# Patient Record
Sex: Female | Born: 1945
Health system: Southern US, Community
[De-identification: ages and names within clinical notes are randomized; demographics above are authoritative.]

## PROBLEM LIST (undated history)

## (undated) DIAGNOSIS — M4310 Spondylolisthesis, site unspecified: Secondary | ICD-10-CM

## (undated) DIAGNOSIS — F329 Major depressive disorder, single episode, unspecified: Secondary | ICD-10-CM

## (undated) DIAGNOSIS — K219 Gastro-esophageal reflux disease without esophagitis: Secondary | ICD-10-CM

## (undated) DIAGNOSIS — Z8639 Personal history of other endocrine, nutritional and metabolic disease: Secondary | ICD-10-CM

## (undated) DIAGNOSIS — F32A Depression, unspecified: Secondary | ICD-10-CM

## (undated) DIAGNOSIS — G894 Chronic pain syndrome: Secondary | ICD-10-CM

## (undated) DIAGNOSIS — N189 Chronic kidney disease, unspecified: Secondary | ICD-10-CM

## (undated) DIAGNOSIS — J302 Other seasonal allergic rhinitis: Secondary | ICD-10-CM

## (undated) DIAGNOSIS — M1990 Unspecified osteoarthritis, unspecified site: Secondary | ICD-10-CM

## (undated) DIAGNOSIS — I10 Essential (primary) hypertension: Secondary | ICD-10-CM

## (undated) DIAGNOSIS — G43909 Migraine, unspecified, not intractable, without status migrainosus: Secondary | ICD-10-CM

## (undated) DIAGNOSIS — E1121 Type 2 diabetes mellitus with diabetic nephropathy: Secondary | ICD-10-CM

## (undated) DIAGNOSIS — M5136 Other intervertebral disc degeneration, lumbar region: Secondary | ICD-10-CM

## (undated) DIAGNOSIS — E119 Type 2 diabetes mellitus without complications: Secondary | ICD-10-CM

## (undated) DIAGNOSIS — M4180 Other forms of scoliosis, site unspecified: Secondary | ICD-10-CM

## (undated) DIAGNOSIS — M5126 Other intervertebral disc displacement, lumbar region: Secondary | ICD-10-CM

## (undated) DIAGNOSIS — M810 Age-related osteoporosis without current pathological fracture: Secondary | ICD-10-CM

## (undated) HISTORY — DX: Chronic kidney disease, unspecified: N18.9

## (undated) HISTORY — DX: Depression, unspecified: F32.A

## (undated) HISTORY — DX: Personal history of other endocrine, nutritional and metabolic disease: Z86.39

## (undated) HISTORY — DX: Other seasonal allergic rhinitis: J30.2

## (undated) HISTORY — DX: Major depressive disorder, single episode, unspecified: F32.9

## (undated) HISTORY — DX: Unspecified osteoarthritis, unspecified site: M19.90

## (undated) HISTORY — DX: Gastro-esophageal reflux disease without esophagitis: K21.9

## (undated) HISTORY — PX: OTHER SURGICAL HISTORY: SHX169

## (undated) HISTORY — DX: Type 2 diabetes mellitus without complications: E11.9

## (undated) HISTORY — DX: Migraine, unspecified, not intractable, without status migrainosus: G43.909

## (undated) HISTORY — DX: Other intervertebral disc displacement, lumbar region: M51.26

## (undated) HISTORY — DX: Other forms of scoliosis, site unspecified: M41.80

## (undated) HISTORY — DX: Type 2 diabetes mellitus with diabetic nephropathy: E11.21

## (undated) HISTORY — DX: Essential (primary) hypertension: I10

## (undated) HISTORY — DX: Other intervertebral disc degeneration, lumbar region: M51.36

## (undated) HISTORY — DX: Chronic pain syndrome: G89.4

## (undated) HISTORY — DX: Spondylolisthesis, site unspecified: M43.10

## (undated) HISTORY — DX: Age-related osteoporosis without current pathological fracture: M81.0

---
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
## 1991-08-14 HISTORY — PX: BUNIONECTOMY: SHX129

## 1991-08-14 HISTORY — PX: NECK SURGERY: SHX720

## 1997-08-13 HISTORY — PX: REPLACEMENT TOTAL KNEE: SUR1224

## 2001-08-13 HISTORY — PX: FOOT SURGERY: SHX648

## 2006-08-13 HISTORY — PX: FOOT SURGERY: SHX648

## 2014-06-05 LAB — COMPREHENSIVE METABOLIC PANEL
CALCIUM: 8.9 mg/dL
CO2: 23 mmol/L

## 2014-06-05 LAB — BASIC METABOLIC PANEL
Potassium: 3.8 mmol/L (ref 3.4–5.3)
Sodium: 138 mmol/L (ref 137–147)

## 2014-06-05 LAB — COMPLETE METABOLIC PANEL WITH GFR: GFR, EST NON AFRICAN AMERICAN: 48

## 2014-06-05 LAB — HEPATIC FUNCTION PANEL
ALT: 9 U/L (ref 7–35)
AST: 15 U/L (ref 13–35)

## 2014-06-05 LAB — HEMOGLOBIN A1C: HEMOGLOBIN A1C: 6.1

## 2014-06-05 LAB — VITAMIN D 25 HYDROXY (VIT D DEFICIENCY, FRACTURES)
25-Hydroxy, Vitamin D: 45.4
VITAMIN B12: 715

## 2014-06-05 LAB — CREATININE, URINE, RANDOM
Creatinine, Urine: 74
Microalb, Ur Waived: 4.4

## 2014-06-05 LAB — LIPID PANEL
Cholesterol: 170 mg/dL (ref 0–200)
HDL: 49 mg/dL (ref 35–70)
LDL Cholesterol: 90 mg/dL
Triglycerides: 153 mg/dL (ref 40–160)

## 2014-06-05 LAB — MICROALBUMIN / CREATININE URINE RATIO: MICROALB/CREAT RATIO: 5.9

## 2014-06-07 LAB — BASIC METABOLIC PANEL
BUN: 17 mg/dL (ref 4–21)
Creatinine: 1.2 mg/dL — AB (ref <=1.1)
Glucose: 102 mg/dL

## 2014-09-24 DIAGNOSIS — Z1231 Encounter for screening mammogram for malignant neoplasm of breast: Secondary | ICD-10-CM | POA: Diagnosis not present

## 2014-09-24 DIAGNOSIS — I10 Essential (primary) hypertension: Secondary | ICD-10-CM | POA: Diagnosis not present

## 2014-09-24 DIAGNOSIS — M5416 Radiculopathy, lumbar region: Secondary | ICD-10-CM | POA: Diagnosis not present

## 2014-09-24 DIAGNOSIS — E119 Type 2 diabetes mellitus without complications: Secondary | ICD-10-CM | POA: Diagnosis not present

## 2015-01-13 DIAGNOSIS — Z803 Family history of malignant neoplasm of breast: Secondary | ICD-10-CM | POA: Diagnosis not present

## 2015-01-13 DIAGNOSIS — Z1231 Encounter for screening mammogram for malignant neoplasm of breast: Secondary | ICD-10-CM | POA: Diagnosis not present

## 2015-01-17 DIAGNOSIS — M5416 Radiculopathy, lumbar region: Secondary | ICD-10-CM | POA: Diagnosis not present

## 2015-02-01 DIAGNOSIS — M810 Age-related osteoporosis without current pathological fracture: Secondary | ICD-10-CM | POA: Diagnosis not present

## 2015-02-01 DIAGNOSIS — E118 Type 2 diabetes mellitus with unspecified complications: Secondary | ICD-10-CM | POA: Diagnosis not present

## 2015-04-05 DIAGNOSIS — E119 Type 2 diabetes mellitus without complications: Secondary | ICD-10-CM | POA: Diagnosis not present

## 2015-04-05 DIAGNOSIS — H2513 Age-related nuclear cataract, bilateral: Secondary | ICD-10-CM | POA: Diagnosis not present

## 2015-05-16 DIAGNOSIS — Z23 Encounter for immunization: Secondary | ICD-10-CM | POA: Diagnosis not present

## 2015-05-16 DIAGNOSIS — E119 Type 2 diabetes mellitus without complications: Secondary | ICD-10-CM | POA: Diagnosis not present

## 2015-05-16 DIAGNOSIS — M810 Age-related osteoporosis without current pathological fracture: Secondary | ICD-10-CM | POA: Diagnosis not present

## 2015-05-16 DIAGNOSIS — G43009 Migraine without aura, not intractable, without status migrainosus: Secondary | ICD-10-CM | POA: Diagnosis not present

## 2015-05-16 DIAGNOSIS — I10 Essential (primary) hypertension: Secondary | ICD-10-CM | POA: Diagnosis not present

## 2015-06-02 ENCOUNTER — Ambulatory Visit (INDEPENDENT_AMBULATORY_CARE_PROVIDER_SITE_OTHER): Payer: Medicare Other | Admitting: Osteopathic Medicine

## 2015-06-02 ENCOUNTER — Encounter: Payer: Self-pay | Admitting: Osteopathic Medicine

## 2015-06-02 VITALS — BP 139/82 | HR 89 | Ht 62.0 in | Wt 205.0 lb

## 2015-06-02 DIAGNOSIS — G43809 Other migraine, not intractable, without status migrainosus: Secondary | ICD-10-CM | POA: Diagnosis not present

## 2015-06-02 DIAGNOSIS — E119 Type 2 diabetes mellitus without complications: Secondary | ICD-10-CM | POA: Insufficient documentation

## 2015-06-02 DIAGNOSIS — I10 Essential (primary) hypertension: Secondary | ICD-10-CM | POA: Insufficient documentation

## 2015-06-02 DIAGNOSIS — J302 Other seasonal allergic rhinitis: Secondary | ICD-10-CM

## 2015-06-02 DIAGNOSIS — Z8639 Personal history of other endocrine, nutritional and metabolic disease: Secondary | ICD-10-CM

## 2015-06-02 DIAGNOSIS — K219 Gastro-esophageal reflux disease without esophagitis: Secondary | ICD-10-CM

## 2015-06-02 DIAGNOSIS — M810 Age-related osteoporosis without current pathological fracture: Secondary | ICD-10-CM

## 2015-06-02 DIAGNOSIS — G43909 Migraine, unspecified, not intractable, without status migrainosus: Secondary | ICD-10-CM

## 2015-06-02 HISTORY — DX: Essential (primary) hypertension: I10

## 2015-06-02 HISTORY — DX: Personal history of other endocrine, nutritional and metabolic disease: Z86.39

## 2015-06-02 HISTORY — DX: Age-related osteoporosis without current pathological fracture: M81.0

## 2015-06-02 HISTORY — DX: Gastro-esophageal reflux disease without esophagitis: K21.9

## 2015-06-02 HISTORY — DX: Migraine, unspecified, not intractable, without status migrainosus: G43.909

## 2015-06-02 HISTORY — DX: Type 2 diabetes mellitus without complications: E11.9

## 2015-06-02 HISTORY — DX: Other seasonal allergic rhinitis: J30.2

## 2015-06-02 MED ORDER — GLYBURIDE 2.5 MG PO TABS: 2.5000 mg | ORAL_TABLET | Freq: Every day | ORAL | Status: DC

## 2015-06-02 NOTE — Progress Notes (Signed)
HPI: Teresa Medina (goes by Teresa Medina) is a 69 y.o. female who presents to [facility]  today for chief complaint of:  Chief Complaint  Patient presents with  . Establish Care    PCP/ manage diabetes   Moved from [place], needs to get refills.   DIABETES - has only been on Glyburide since 1992 diagnosis D> Lost weight over the years (intentional). Wants to stay on this, never been on Metformin. Checks Glc regularly in AM. Recent labs in August 2016 (2 months ago)  HTN - controlled, no CP/SOB  MIGRAINE - controlled with prn triptan, takes Topamax  GERD - controlled on PPI  PAIN/ARTHRITIS - Neck and back pain. 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    DIABETES SCREENING/PREVENTIVE CARE: A1C past 3-6 mos: Yes  controlled? Yes 6.2 05/16/15 BP goal <140/90: Yes  LDL goal <70: await records Eye exam annually: Yes , importance discussed with patient Foot exam: Yes  Microalbuminuria:Yes  Metformin: No ACE/ARB: Yes  Antiplatelet if ASCVD Risk >10%: No  Statin: No     Past medical, social and family history reviewed: Past Medical History  Diagnosis Date  . Hypertension   . Diabetes mellitus without complication (HCC)   . Migraine   . Arthritis   . Type 2 diabetes mellitus (HCC) 06/02/2015  . Essential hypertension 06/02/2015  . Migraine 06/02/2015  . Seasonal allergies 06/02/2015  . History of vitamin D deficiency 06/02/2015  . Osteoporosis 06/02/2015   No past surgical history on file. Social History  Substance Use Topics  . Smoking status: Not on file  . Smokeless tobacco: Not on file  . Alcohol Use: No   Family History  Problem Relation Age of Onset  . Diabetes Sister   . Heart Problems Sister     multiple bypass surgeries  . Cancer Sister   . Depression Brother   . Heart Problems  Brother     bad heart use oxygen    No current outpatient prescriptions on file.   No current facility-administered medications for this visit.   Allergies not on file    Review of Systems: CONSTITUTIONAL:  No  fever, no chills, No  unintentional weight changes HEAD/EYES/EARS/NOSE/THROAT: No headache, no vision change, no hearing change, No  sore throat CARDIAC: No chest pain, no pressure/palpitations, no orthopnea RESPIRATORY: No  cough, No  shortness of breath/wheeze GASTROINTESTINAL: No nausea, no vomiting, no abdominal pain, no blood in stool, no diarrhea, no constipation MUSCULOSKELETAL: (+) chronic myalgia/arthralgia GENITOURINARY: No incontinence, No abnormal genital bleeding/discharge SKIN: No rash/wounds/concerning lesions HEM/ONC: No easy bruising/bleeding, no abnormal lymph node ENDOCRINE: No polyuria/polydipsia/polyphagia, no heat/cold intolerance  NEUROLOGIC: No weakness, no dizziness, no slurred speech PSYCHIATRIC: No concerns with depression, no concerns with anxiety, no sleep problems    Exam:  BP 139/82 mmHg  Pulse 89  Ht  (1.575 m)  Wt 205 lb (92.987 kg)  BMI 37.49 kg/m2  SpO2 94% Constitutional: VSS, see above. General Appearance: alert, well-developed, well-nourished, NAD Eyes: Normal lids and conjunctive, non-icteric sclera,  Neck: No masses, trachea midline. No thyroid enlargement/tenderness/mass appreciated. No lymphadenopathy Respiratory: Normal respiratory effort. no wheeze, no rhonchi, no rales Cardiovascular: S1/S2 normal, no murmur, no rub/gallop auscultated. RRR.  No lower extremity edema. Gastrointestinal: Nontender, no masses.  Musculoskeletal: Gait normal. No clubbing/cyanosis of digits.  Neurological: No cranial nerve deficit on limited exam. Psychiatric: Normal judgment/insight. Normal mood and affect. Oriented x3.    No results found for this or any previous visit (from the past 72 hour(s)).    ASSESSMENT/PLAN:  Type 2 diabetes  mellitus without complication, without long-term current use of insulin (HCC) - Needs updated: review records for preventive. Consider ASA, statin. Pt declines Metformin.  - Plan: glyBURIDE (DIABETA) 2.5 MG tablet  Essential hypertension - contorlled  Other type of migraine - controlled  Seasonal allergies - ocntrolled  History of vitamin D deficiency  Osteoporosis - await records re: DEXA< may require med management  Gastroesophageal reflux disease, esophagitis presence not specified   Return in about 3 months (around 09/02/2015), or if symptoms worsen or fail to improve, for RECHECK A1C, REVIEW RECORDS AND CATCH UP ON PREVENTIVE CARE (30 MINUTE VISIT).

## 2015-06-20 ENCOUNTER — Other Ambulatory Visit: Payer: Self-pay | Admitting: Osteopathic Medicine

## 2015-06-20 NOTE — Telephone Encounter (Signed)
Can we confirm that she takes 50 mg po daily? Sig in history only says 50 by mouth. Thanks.

## 2015-06-21 ENCOUNTER — Telehealth: Payer: Self-pay | Admitting: Osteopathic Medicine

## 2015-06-21 MED ORDER — CYCLOBENZAPRINE HCL 10 MG PO TABS: 10.0000 mg | ORAL_TABLET | Freq: Two times a day (BID) | ORAL | Status: DC | PRN

## 2015-06-21 MED ORDER — FENTANYL 50 MCG/HR TD PT72: 50.0000 ug | MEDICATED_PATCH | TRANSDERMAL | Status: DC

## 2015-06-21 MED ORDER — RABEPRAZOLE SODIUM 20 MG PO TBEC: 20.0000 mg | DELAYED_RELEASE_TABLET | Freq: Every day | ORAL | Status: DC

## 2015-06-21 MED ORDER — TOPIRAMATE 50 MG PO TABS: 50.0000 mg | ORAL_TABLET | Freq: Two times a day (BID) | ORAL | Status: DC

## 2015-06-21 MED ORDER — MONTELUKAST SODIUM 10 MG PO TABS: 10.0000 mg | ORAL_TABLET | Freq: Every day | ORAL | Status: DC

## 2015-06-21 MED ORDER — METOPROLOL SUCCINATE ER 50 MG PO TB24: 50.0000 mg | ORAL_TABLET | Freq: Every day | ORAL | Status: DC

## 2015-06-21 NOTE — Telephone Encounter (Signed)
Spoke with Pt and she states she takes the Topamax BID. While on the phone, Pt realized she needs some refills on other medications. Verified dosage on all Rx's. Will route to PCP for review.

## 2015-06-21 NOTE — Telephone Encounter (Signed)
Spoke to patient gave her information as noted below. Patient understands that she need to get records from original provider. Rhonda Cunningham,CMA

## 2015-06-21 NOTE — Telephone Encounter (Signed)
Sent refills.  Fentanyl patches refilled, she needs to pick up prescription for this. I gave 30 days supply but will NOT give more without records from original prescriber - pt needs to provide these for us as I do not prescribe these meds routinely, alternatively we can refer her to pain management.

## 2015-06-22 ENCOUNTER — Other Ambulatory Visit: Payer: Self-pay | Admitting: Osteopathic Medicine

## 2015-06-22 MED ORDER — TOPIRAMATE 50 MG PO TABS: 50.0000 mg | ORAL_TABLET | Freq: Two times a day (BID) | ORAL | Status: DC

## 2015-06-23 ENCOUNTER — Encounter: Payer: Self-pay | Admitting: Osteopathic Medicine

## 2015-06-23 DIAGNOSIS — G894 Chronic pain syndrome: Secondary | ICD-10-CM

## 2015-06-23 DIAGNOSIS — M4310 Spondylolisthesis, site unspecified: Secondary | ICD-10-CM

## 2015-06-23 DIAGNOSIS — M4180 Other forms of scoliosis, site unspecified: Secondary | ICD-10-CM

## 2015-06-23 DIAGNOSIS — M51369 Other intervertebral disc degeneration, lumbar region without mention of lumbar back pain or lower extremity pain: Secondary | ICD-10-CM

## 2015-06-23 DIAGNOSIS — M5126 Other intervertebral disc displacement, lumbar region: Secondary | ICD-10-CM

## 2015-06-23 DIAGNOSIS — M5136 Other intervertebral disc degeneration, lumbar region: Secondary | ICD-10-CM

## 2015-06-23 HISTORY — DX: Chronic pain syndrome: G89.4

## 2015-06-23 HISTORY — DX: Other intervertebral disc displacement, lumbar region: M51.26

## 2015-06-23 HISTORY — DX: Other intervertebral disc degeneration, lumbar region: M51.36

## 2015-06-23 HISTORY — DX: Other intervertebral disc degeneration, lumbar region without mention of lumbar back pain or lower extremity pain: M51.369

## 2015-06-23 HISTORY — DX: Other forms of scoliosis, site unspecified: M41.80

## 2015-06-23 HISTORY — DX: Spondylolisthesis, site unspecified: M43.10

## 2015-07-06 ENCOUNTER — Other Ambulatory Visit: Payer: Self-pay | Admitting: Osteopathic Medicine

## 2015-07-15 ENCOUNTER — Other Ambulatory Visit: Payer: Self-pay

## 2015-07-15 ENCOUNTER — Telehealth: Payer: Self-pay

## 2015-07-15 MED ORDER — FENTANYL 50 MCG/HR TD PT72: 50.0000 ug | MEDICATED_PATCH | TRANSDERMAL | Status: DC

## 2015-07-15 NOTE — Telephone Encounter (Signed)
Per previous phone note from Dr. Lyn HollingsheadAlexander we will not be providing any more fentanyl until records are obtained from her previous providers. I will route this to Dr. Lyn HollingsheadAlexander as well.

## 2015-07-15 NOTE — Telephone Encounter (Signed)
Patient states she only received a half of months prescription for her Fentanyl patch. She would like another prescription for the rest of the month and Dr Lyn HollingsheadAlexander has left for the day.

## 2015-07-18 ENCOUNTER — Other Ambulatory Visit: Payer: Self-pay | Admitting: Osteopathic Medicine

## 2015-07-18 MED ORDER — FENTANYL 50 MCG/HR TD PT72: 50.0000 ug | MEDICATED_PATCH | TRANSDERMAL | Status: DC

## 2015-07-18 NOTE — Telephone Encounter (Signed)
Patient aware.

## 2015-07-18 NOTE — Telephone Encounter (Signed)
I did get her records, we can refill Fentanyl for 30 days supply

## 2015-08-18 ENCOUNTER — Other Ambulatory Visit: Payer: Self-pay | Admitting: Osteopathic Medicine

## 2015-08-18 ENCOUNTER — Telehealth: Payer: Self-pay

## 2015-08-18 DIAGNOSIS — G894 Chronic pain syndrome: Secondary | ICD-10-CM

## 2015-08-18 MED ORDER — FENTANYL 50 MCG/HR TD PT72: 50.0000 ug | MEDICATED_PATCH | TRANSDERMAL | Status: DC

## 2015-08-18 NOTE — Telephone Encounter (Signed)
Patient would like a refill on her fentanyl patches. Is this medication going to be a long term medication? Please advise.

## 2015-08-18 NOTE — Telephone Encounter (Signed)
Patient advised to pick up prescription.

## 2015-08-18 NOTE — Telephone Encounter (Signed)
This is a long-term medication, she came to me on these. In the future, we can tell patient for pharmacy to send us request for these

## 2015-08-26 ENCOUNTER — Telehealth: Payer: Self-pay | Admitting: Emergency Medicine

## 2015-08-26 DIAGNOSIS — I10 Essential (primary) hypertension: Secondary | ICD-10-CM

## 2015-08-26 DIAGNOSIS — G894 Chronic pain syndrome: Secondary | ICD-10-CM

## 2015-08-26 MED ORDER — LOSARTAN POTASSIUM 100 MG PO TABS: 100.0000 mg | ORAL_TABLET | Freq: Every day | ORAL | Status: DC

## 2015-08-31 ENCOUNTER — Encounter: Payer: Self-pay | Admitting: Osteopathic Medicine

## 2015-08-31 ENCOUNTER — Ambulatory Visit (INDEPENDENT_AMBULATORY_CARE_PROVIDER_SITE_OTHER): Payer: Medicare Other | Admitting: Osteopathic Medicine

## 2015-08-31 VITALS — BP 133/68 | HR 85 | Ht 62.0 in | Wt 207.0 lb

## 2015-08-31 DIAGNOSIS — M810 Age-related osteoporosis without current pathological fracture: Secondary | ICD-10-CM

## 2015-08-31 DIAGNOSIS — E119 Type 2 diabetes mellitus without complications: Secondary | ICD-10-CM

## 2015-08-31 DIAGNOSIS — Z8639 Personal history of other endocrine, nutritional and metabolic disease: Secondary | ICD-10-CM | POA: Diagnosis not present

## 2015-08-31 DIAGNOSIS — Z780 Asymptomatic menopausal state: Secondary | ICD-10-CM

## 2015-08-31 DIAGNOSIS — J302 Other seasonal allergic rhinitis: Secondary | ICD-10-CM

## 2015-08-31 DIAGNOSIS — I10 Essential (primary) hypertension: Secondary | ICD-10-CM | POA: Diagnosis not present

## 2015-08-31 DIAGNOSIS — G43809 Other migraine, not intractable, without status migrainosus: Secondary | ICD-10-CM | POA: Diagnosis not present

## 2015-08-31 DIAGNOSIS — G894 Chronic pain syndrome: Secondary | ICD-10-CM

## 2015-08-31 DIAGNOSIS — K219 Gastro-esophageal reflux disease without esophagitis: Secondary | ICD-10-CM

## 2015-08-31 DIAGNOSIS — Z79899 Other long term (current) drug therapy: Secondary | ICD-10-CM

## 2015-08-31 DIAGNOSIS — M949 Disorder of cartilage, unspecified: Secondary | ICD-10-CM

## 2015-08-31 DIAGNOSIS — M899 Disorder of bone, unspecified: Secondary | ICD-10-CM

## 2015-08-31 DIAGNOSIS — Z23 Encounter for immunization: Secondary | ICD-10-CM

## 2015-08-31 HISTORY — DX: Asymptomatic menopausal state: Z78.0

## 2015-08-31 LAB — POCT GLYCOSYLATED HEMOGLOBIN (HGB A1C): Hemoglobin A1C: 7.1

## 2015-08-31 MED ORDER — DULOXETINE HCL 30 MG PO CPEP: 90.0000 mg | ORAL_CAPSULE | Freq: Every day | ORAL | Status: DC

## 2015-08-31 MED ORDER — SUMATRIPTAN SUCCINATE 100 MG PO TABS: ORAL_TABLET | ORAL | Status: DC

## 2015-08-31 MED ORDER — AMBULATORY NON FORMULARY MEDICATION
Status: DC
Start: 2015-08-31 — End: 2017-10-10

## 2015-08-31 NOTE — Progress Notes (Signed)
HPI: Teresa Medina (goes by Teresa Medina) is a 70 y.o. female who presents to [facility]  today for chief complaint of:  Chief Complaint  Patient presents with  . Follow-up    DIABETES    DIABETES - has only been on Glyburide since 1992 diagnosis. Lost weight over the years (intentional). Wants to stay on this, never been on Metformin. Checks Glc regularly in AM. DIABETES SCREENING/PREVENTIVE CARE: A1C past 3-6 mos: Yes  controlled? Yes 6.2 05/16/15,  BP goal <140/90: Yes  LDL goal <70: await labs Eye exam annually: Yes , importance discussed with patient Foot exam:  today Microalbuminuria:   Metformin: No - declines ACE/ARB: Yes  Antiplatelet if ASCVD Risk >10%: await labs Statin: No   HTN - controlled, no CP/SOB  MIGRAINE - controlled with prn triptan, takes Topamax, needs refills  GERD - controlled on PPI  PAIN/ARTHRITIS - Neck and back pain. 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     Past medical, social and family history reviewed: Past Medical History  Diagnosis Date  . Hypertension   . Diabetes mellitus without complication (HCC)   . Migraine   . Arthritis   . Type 2 diabetes mellitus (HCC) 06/02/2015  . Essential hypertension 06/02/2015  . Migraine 06/02/2015  . Seasonal allergies 06/02/2015  . History of vitamin D deficiency 06/02/2015  . Osteoporosis 06/02/2015  . GERD (gastroesophageal reflux disease) 06/02/2015  . Chronic pain syndrome 06/23/2015    Records reviewed: pt has been on Fentanyl TD, Cymbalta, Flexeril  . Levoscoliosis 06/23/2015    Mild, MRI 06/22/14  . Retrolisthesis of vertebrae 06/23/2015    Gr1 L1 on L2 MRI 06/2014  . Degenerative disc disease, lumbar 06/23/2015    MRI 06/2014 most severe L4-5  . Herniation of intervertebral disc between L4 and L5 06/23/2015    MRI 06/2014 moderate to severe R foraminal narrowing   No past surgical history on file. Social History  Substance Use Topics  . Smoking status: Never Smoker   . Smokeless tobacco: Not on file  . Alcohol Use: No   Family History  Problem Relation Age of Onset  . Diabetes Sister   . Heart Problems Sister     multiple bypass surgeries  . Cancer Sister   . Depression Brother   . Heart Problems Brother     bad heart use oxygen    Current Outpatient Prescriptions  Medication Sig Dispense Refill  . Cholecalciferol (VITAMIN D-3) 1000 UNITS CAPS Take by mouth.    . cyclobenzaprine (FLEXERIL) 10 MG tablet Take 1 tablet (10 mg total) by mouth 2 (two) times daily as needed for muscle spasms. 30 tablet 3  . DULoxetine (CYMBALTA) 30 MG capsule Take 30 mg by mouth daily.    . fentaNYL (DURAGESIC - DOSED MCG/HR) 50 MCG/HR Place 1 patch (50 mcg total) onto the skin every 3 (three) days. 10 patch 0  . fluticasone (FLONASE) 50 MCG/ACT nasal spray Place into both nostrils.    Marland Kitchen glucose blood test strip by Other route. Free Style Test Strips    . glyBURIDE (DIABETA) 2.5 MG tablet Take 1 tablet (2.5 mg total) by mouth daily with breakfast. 90 tablet 3  . losartan (COZAAR) 100 MG tablet Take 1 tablet (100 mg total) by mouth daily. 90 tablet 0  . metoprolol  succinate (TOPROL XL) 50 MG 24 hr tablet Take 1 tablet (50 mg total) by mouth daily. Take with or immediately following a meal. 90 tablet 1  . montelukast (SINGULAIR) 10 MG tablet Take 1 tablet (10 mg total) by mouth at bedtime. 90 tablet 1  . RABEprazole (ACIPHEX) 20 MG tablet Take 1 tablet (20 mg total) by mouth daily. 90 tablet 1  . SUMAtriptan (IMITREX) 100 MG tablet Take 100 mg by mouth. May repeat in 2 hours if headache persists or recurs.    . topiramate (TOPAMAX) 50 MG tablet Take 1 tablet (50 mg total) by mouth 2 (two) times daily. 180 tablet 2   No current facility-administered medications for this visit.   No Known Allergies    Review  of Systems: CONSTITUTIONAL:  No  fever, no chills, No  unintentional weight changes HEAD/EYES/EARS/NOSE/THROAT: No headache, no vision change, no hearing change, No  sore throat CARDIAC: No chest pain, no pressure/palpitations, no orthopnea RESPIRATORY: No  cough, No  shortness of breath/wheeze GASTROINTESTINAL: No nausea, no vomiting, no abdominal pain MUSCULOSKELETAL: (+) chronic myalgia/arthralgia ENDOCRINE: No polyuria/polydipsia/polyphagia, no heat/cold intolerance     Exam:  BP 133/68 mmHg  Pulse 85  Ht  (1.575 m)  Wt 207 lb (93.895 kg)  BMI 37.85 kg/m2 Constitutional: VSS, see above. General Appearance: alert, well-developed, well-nourished, NAD Eyes: Normal lids and conjunctive, non-icteric sclera,  Neck: No masses, trachea midline. No thyroid enlargement/tenderness/mass appreciated. No lymphadenopathy Respiratory: Normal respiratory effort. no wheeze, no rhonchi, no rales Cardiovascular: S1/S2 normal, no murmur, no rub/gallop auscultated. RRR. No lower extremity edema. Gastrointestinal: Nontender, no masses.  Musculoskeletal: Gait normal. No clubbing/cyanosis of digits.  Neurological: No cranial nerve deficit on limited exam. Psychiatric: Normal judgment/insight. Normal mood and affect. Oriented x3.    ASSESSMENT/PLAN:  Type 2 diabetes mellitus without complication, without long-term current use of insulin (HCC) - Plan: CBC with Differential/Platelet, COMPLETE METABOLIC PANEL WITH GFR, Lipid panel, TSH  Essential hypertension  Other type of migraine  History of vitamin D deficiency - Plan: SUMAtriptan (IMITREX) 100 MG tablet  Gastroesophageal reflux disease, esophagitis presence not specified  Osteoporosis  Seasonal allergies  Medication management - Plan: CBC with Differential/Platelet, COMPLETE METABOLIC PANEL WITH GFR, Lipid panel  Postmenopausal - Plan: VITAMIN D 25 Hydroxy (Vit-D Deficiency, Fractures)  Disorder of bone and cartilage - Plan: VITAMIN  D 25 Hydroxy (Vit-D Deficiency, Fractures)  Chronic pain syndrome - Plan: DULoxetine (CYMBALTA) 30 MG capsule  Need for zoster vaccination - Plan: AMBULATORY NON FORMULARY MEDICATION   Return in about 3 months (around 11/29/2015) for ANNUAL PREVENTIVE CARE PHYSICAL.

## 2015-09-14 ENCOUNTER — Other Ambulatory Visit: Payer: Self-pay | Admitting: Osteopathic Medicine

## 2015-09-14 MED ORDER — FENTANYL 50 MCG/HR TD PT72: 50.0000 ug | MEDICATED_PATCH | TRANSDERMAL | Status: DC

## 2015-09-14 NOTE — Telephone Encounter (Signed)
Ok I left Rx up front can call her to pick up thanks

## 2015-10-10 ENCOUNTER — Telehealth: Payer: Self-pay

## 2015-10-10 MED ORDER — RABEPRAZOLE SODIUM 20 MG PO TBEC: 20.0000 mg | DELAYED_RELEASE_TABLET | Freq: Every day | ORAL | Status: DC

## 2015-10-10 NOTE — Telephone Encounter (Signed)
Patient request refill for Aciphex. 90 day supply. Rhonda Cunningham,CMA

## 2015-10-11 ENCOUNTER — Telehealth: Payer: Self-pay | Admitting: Osteopathic Medicine

## 2015-10-11 NOTE — Telephone Encounter (Signed)
Received fax for prior authorization sent through cover my meds and received an authorization. - CF

## 2015-10-18 ENCOUNTER — Other Ambulatory Visit: Payer: Self-pay | Admitting: Osteopathic Medicine

## 2015-10-20 ENCOUNTER — Telehealth: Payer: Self-pay

## 2015-10-20 DIAGNOSIS — G894 Chronic pain syndrome: Secondary | ICD-10-CM

## 2015-10-20 MED ORDER — FENTANYL 50 MCG/HR TD PT72: 50.0000 ug | MEDICATED_PATCH | TRANSDERMAL | Status: DC

## 2015-10-20 NOTE — Telephone Encounter (Signed)
Patient daughter called [facility][phone] refills was approved and signed for daughter to pick up today. Rhonda Cunningham,CMA

## 2015-11-24 ENCOUNTER — Telehealth: Payer: Self-pay | Admitting: *Deleted

## 2015-11-24 DIAGNOSIS — Z79899 Other long term (current) drug therapy: Secondary | ICD-10-CM | POA: Diagnosis not present

## 2015-11-24 DIAGNOSIS — M949 Disorder of cartilage, unspecified: Secondary | ICD-10-CM | POA: Diagnosis not present

## 2015-11-24 DIAGNOSIS — M899 Disorder of bone, unspecified: Secondary | ICD-10-CM | POA: Diagnosis not present

## 2015-11-24 DIAGNOSIS — E119 Type 2 diabetes mellitus without complications: Secondary | ICD-10-CM | POA: Diagnosis not present

## 2015-11-24 DIAGNOSIS — G894 Chronic pain syndrome: Secondary | ICD-10-CM

## 2015-11-24 DIAGNOSIS — Z780 Asymptomatic menopausal state: Secondary | ICD-10-CM | POA: Diagnosis not present

## 2015-11-24 MED ORDER — FENTANYL 50 MCG/HR TD PT72: 50.0000 ug | MEDICATED_PATCH | TRANSDERMAL | Status: DC

## 2015-11-24 NOTE — Telephone Encounter (Addendum)
Daughter called at 1:24 stating her mother was coming up here for labs and wanted to know if we could have her pain patches read. Printed rx and put in providers box

## 2015-11-25 LAB — COMPLETE METABOLIC PANEL WITH GFR
ALT: 8 U/L (ref 6–29)
AST: 13 U/L (ref 10–35)
Albumin: 4.1 g/dL (ref 3.6–5.1)
Alkaline Phosphatase: 101 U/L (ref 33–130)
BUN: 16 mg/dL (ref 7–25)
CHLORIDE: 103 mmol/L (ref 98–110)
CO2: 24 mmol/L (ref 20–31)
Calcium: 9.4 mg/dL (ref 8.6–10.4)
Creat: 1.11 mg/dL — ABNORMAL HIGH (ref 0.60–0.93)
GFR, EST NON AFRICAN AMERICAN: 50 mL/min — AB (ref >=60)
GFR, Est African American: 58 mL/min — ABNORMAL LOW (ref >=60)
GLUCOSE: 132 mg/dL — AB (ref 65–99)
POTASSIUM: 4.1 mmol/L (ref 3.5–5.3)
SODIUM: 137 mmol/L (ref 135–146)
Total Bilirubin: 0.7 mg/dL (ref 0.2–1.2)
Total Protein: 6.5 g/dL (ref 6.1–8.1)

## 2015-11-25 LAB — TSH: TSH: 0.7 m[IU]/L

## 2015-11-25 LAB — LIPID PANEL
CHOL/HDL RATIO: 3.5 ratio (ref <=5.0)
Cholesterol: 178 mg/dL (ref 125–200)
HDL: 51 mg/dL (ref >=46)
LDL CALC: 100 mg/dL (ref <130)
Triglycerides: 137 mg/dL (ref <150)
VLDL: 27 mg/dL (ref <30)

## 2015-11-25 LAB — CBC WITH DIFFERENTIAL/PLATELET
BASOS ABS: 0 {cells}/uL (ref 0–200)
Basophils Relative: 0 %
EOS PCT: 2 %
Eosinophils Absolute: 202 cells/uL (ref 15–500)
HCT: 48.4 % — ABNORMAL HIGH (ref 35.0–45.0)
Hemoglobin: 15.3 g/dL (ref 11.7–15.5)
Lymphocytes Relative: 24 %
Lymphs Abs: 2424 cells/uL (ref 850–3900)
MCH: 27.5 pg (ref 27.0–33.0)
MCHC: 31.6 g/dL — AB (ref 32.0–36.0)
MCV: 86.9 fL (ref 80.0–100.0)
MONOS PCT: 6 %
MPV: 10.6 fL (ref 7.5–12.5)
Monocytes Absolute: 606 cells/uL (ref 200–950)
NEUTROS PCT: 68 %
Neutro Abs: 6868 cells/uL (ref 1500–7800)
PLATELETS: 195 10*3/uL (ref 140–400)
RBC: 5.57 MIL/uL — ABNORMAL HIGH (ref 3.80–5.10)
RDW: 13.8 % (ref 11.0–15.0)
WBC: 10.1 10*3/uL (ref 3.8–10.8)

## 2015-11-25 LAB — VITAMIN D 25 HYDROXY (VIT D DEFICIENCY, FRACTURES): Vit D, 25-Hydroxy: 35 ng/mL (ref 30–100)

## 2015-11-29 ENCOUNTER — Ambulatory Visit (INDEPENDENT_AMBULATORY_CARE_PROVIDER_SITE_OTHER): Payer: Medicare Other | Admitting: Osteopathic Medicine

## 2015-11-29 ENCOUNTER — Encounter: Payer: Self-pay | Admitting: Osteopathic Medicine

## 2015-11-29 VITALS — BP 136/84 | HR 60 | Ht 62.0 in | Wt 205.0 lb

## 2015-11-29 DIAGNOSIS — Z0000 Encounter for general adult medical examination without abnormal findings: Secondary | ICD-10-CM

## 2015-11-29 DIAGNOSIS — Z9181 History of falling: Secondary | ICD-10-CM

## 2015-11-29 DIAGNOSIS — E1121 Type 2 diabetes mellitus with diabetic nephropathy: Secondary | ICD-10-CM | POA: Insufficient documentation

## 2015-11-29 DIAGNOSIS — Z23 Encounter for immunization: Secondary | ICD-10-CM

## 2015-11-29 DIAGNOSIS — Z780 Asymptomatic menopausal state: Secondary | ICD-10-CM

## 2015-11-29 DIAGNOSIS — I10 Essential (primary) hypertension: Secondary | ICD-10-CM

## 2015-11-29 DIAGNOSIS — N183 Chronic kidney disease, stage 3 (moderate): Secondary | ICD-10-CM

## 2015-11-29 DIAGNOSIS — N189 Chronic kidney disease, unspecified: Secondary | ICD-10-CM

## 2015-11-29 DIAGNOSIS — Z9289 Personal history of other medical treatment: Secondary | ICD-10-CM | POA: Insufficient documentation

## 2015-11-29 HISTORY — DX: Chronic kidney disease, unspecified: N18.9

## 2015-11-29 HISTORY — DX: Type 2 diabetes mellitus with diabetic nephropathy: E11.21

## 2015-11-29 LAB — POCT GLYCOSYLATED HEMOGLOBIN (HGB A1C): HEMOGLOBIN A1C: 6.6

## 2015-11-29 MED ORDER — CALCIUM CARBONATE-VITAMIN D 600-400 MG-UNIT PO CHEW: 2.0000 | CHEWABLE_TABLET | Freq: Every day | ORAL | Status: DC

## 2015-11-29 MED ORDER — CYCLOBENZAPRINE HCL 10 MG PO TABS: 10.0000 mg | ORAL_TABLET | Freq: Three times a day (TID) | ORAL | Status: DC

## 2015-11-29 MED ORDER — LOSARTAN POTASSIUM 100 MG PO TABS: 100.0000 mg | ORAL_TABLET | Freq: Every day | ORAL | Status: DC

## 2015-11-29 NOTE — Patient Instructions (Addendum)
 Fall Prevention in the Home  Falls can cause injuries. They can happen to people of all ages. There are many things you can do to make your home safe and to help prevent falls.  WHAT CAN I DO ON THE OUTSIDE OF MY HOME?  Regularly fix the edges of walkways and driveways and fix any cracks.  Remove anything that might make you trip as you walk through a door, such as a raised step or threshold.  Trim any bushes or trees on the path to your home.  Use bright outdoor lighting.  Clear any walking paths of anything that might make someone trip, such as rocks or tools.  Regularly check to see if handrails are loose or broken. Make sure that both sides of any steps have handrails.  Any raised decks and porches should have guardrails on the edges.  Have any leaves, snow, or ice cleared regularly.  Use sand or salt on walking paths during winter.  Clean up any spills in your garage right away. This includes oil or grease spills. WHAT CAN I DO IN THE BATHROOM?   Use night lights.  Install grab bars by the toilet and in the tub and shower. Do not use towel bars as grab bars.  Use non-skid mats or decals in the tub or shower.  If you need to sit down in the shower, use a plastic, non-slip stool.  Keep the floor dry. Clean up any water that spills on the floor as soon as it happens.  Remove soap buildup in the tub or shower regularly.  Attach bath mats securely with double-sided non-slip rug tape.  Do not have throw rugs and other things on the floor that can make you trip. WHAT CAN I DO IN THE BEDROOM?  Use night lights.  Make sure that you have a light by your bed that is easy to reach.  Do not use any sheets or blankets that are too big for your bed. They should not hang down onto the floor.  Have a firm chair that has side arms. You can use this for support while you get dressed.  Do not have throw rugs and other things on the floor that can make you trip. WHAT CAN I DO  IN THE KITCHEN?  Clean up any spills right away.  Avoid walking on wet floors.  Keep items that you use a lot in easy-to-reach places.  If you need to reach something above you, use a strong step stool that has a grab bar.  Keep electrical cords out of the way.  Do not use floor polish or wax that makes floors slippery. If you must use wax, use non-skid floor wax.  Do not have throw rugs and other things on the floor that can make you trip. WHAT CAN I DO WITH MY STAIRS?  Do not leave any items on the stairs.  Make sure that there are handrails on both sides of the stairs and use them. Fix handrails that are broken or loose. Make sure that handrails are as long as the stairways.  Check any carpeting to make sure that it is firmly attached to the stairs. Fix any carpet that is loose or worn.  Avoid having throw rugs at the top or bottom of the stairs. If you do have throw rugs, attach them to the floor with carpet tape.  Make sure that you have a light switch at the top of the stairs and the bottom of the   stairs. If you do not have them, ask someone to add them for you. WHAT ELSE CAN I DO TO HELP PREVENT FALLS?  Wear shoes that:  Do not have high heels.  Have rubber bottoms.  Are comfortable and fit you well.  Are closed at the toe. Do not wear sandals.  If you use a stepladder:  Make sure that it is fully opened. Do not climb a closed stepladder.  Make sure that both sides of the stepladder are locked into place.  Ask someone to hold it for you, if possible.  Clearly mark and make sure that you can see:  Any grab bars or handrails.  First and last steps.  Where the edge of each step is.  Use tools that help you move around (mobility aids) if they are needed. These include:  Canes.  Walkers.  Scooters.  Crutches.  Turn on the lights when you go into a dark area. Replace any light bulbs as soon as they burn out.  Set up your furniture so you have a clear  path. Avoid moving your furniture around.  If any of your floors are uneven, fix them.  If there are any pets around you, be aware of where they are.  Review your medicines with your doctor. Some medicines can make you feel dizzy. This can increase your chance of falling. Ask your doctor what other things that you can do to help prevent falls.   This information is not intended to replace advice given to you by your health care provider. Make sure you discuss any questions you have with your health care provider.   Document Released: 05/26/2009 Document Revised: 12/14/2014 Document Reviewed: 09/03/2014 Elsevier Interactive Patient Education 2016 [employer]  Health Maintenance, Female Adopting a healthy lifestyle and getting preventive care can go a long way to promote health and wellness. Talk with your health care provider about what schedule of regular examinations is right for you. This is a good chance for you to check in with your provider about disease prevention and staying healthy. In between checkups, there are plenty of things you can do on your own. Experts have done a lot of research about which lifestyle changes and preventive measures are most likely to keep you healthy. Ask your health care provider for more information. WEIGHT AND DIET  Eat a healthy diet  Be sure to include plenty of vegetables, fruits, low-fat dairy products, and lean protein.  Do not eat a lot of foods high in solid fats, added sugars, or salt.  Get regular exercise. This is one of the most important things you can do for your health.  Most adults should exercise for at least 150 minutes each week. The exercise should increase your heart rate and make you sweat (moderate-intensity exercise).  Most adults should also do strengthening exercises at least twice a week. This is in addition to the moderate-intensity exercise.  Maintain a healthy weight  Body mass index (BMI) is a measurement that can  be used to identify possible weight problems. It estimates body fat based on height and weight. Your health care provider can help determine your BMI and help you achieve or maintain a healthy weight.  For females 20 years of age and older:   A BMI below 18.5 is considered underweight.  A BMI of 18.5 to 24.9 is normal.  A BMI of 25 to 29.9 is considered overweight.  A BMI of 30 and above is considered obese.  Watch levels   of cholesterol and blood lipids  You should start having your blood tested for lipids and cholesterol at 71 years of age, then have this test every 5 years.  You may need to have your cholesterol levels checked more often if:  Your lipid or cholesterol levels are high.  You are older than 71 years of age.  You are at high risk for heart disease.  CANCER SCREENING   Lung Cancer  Lung cancer screening is recommended for adults 53-62 years old who are at high risk for lung cancer because of a history of smoking.  A yearly low-dose CT scan of the lungs is recommended for people who:  Currently smoke.  Have quit within the past 15 years.  Have at least a 30-pack-year history of smoking. A pack year is smoking an average of one pack of cigarettes a day for 1 year.  Yearly screening should continue until it has been 15 years since you quit.  Yearly screening should stop if you develop a health problem that would prevent you from having lung cancer treatment.  Breast Cancer  Practice breast self-awareness. This means understanding how your breasts normally appear and feel.  It also means doing regular breast self-exams. Let your health care provider know about any changes, no matter how small.  If you are in your 20s or 30s, you should have a clinical breast exam (CBE) by a health care provider every 1-3 years as part of a regular health exam.  If you are 4 or older, have a CBE every year. Also consider having a breast X-ray (mammogram) every year.  If  you have a family history of breast cancer, talk to your health care provider about genetic screening.  If you are at high risk for breast cancer, talk to your health care provider about having an MRI and a mammogram every year.  Breast cancer gene (BRCA) assessment is recommended for women who have family members with BRCA-related cancers. BRCA-related cancers include:  Breast.  Ovarian.  Tubal.  Peritoneal cancers.  Results of the assessment will determine the need for genetic counseling and BRCA1 and BRCA2 testing. Cervical Cancer Your health care provider may recommend that you be screened regularly for cancer of the pelvic organs (ovaries, uterus, and vagina). This screening involves a pelvic examination, including checking for microscopic changes to the surface of your cervix (Pap test). You may be encouraged to have this screening done every 3 years, beginning at age 75.  For women ages 5-65, health care providers may recommend pelvic exams and Pap testing every 3 years, or they may recommend the Pap and pelvic exam, combined with testing for human papilloma virus (HPV), every 5 years. Some types of HPV increase your risk of cervical cancer. Testing for HPV may also be done on women of any age with unclear Pap test results.  Other health care providers may not recommend any screening for nonpregnant women who are considered low risk for pelvic cancer and who do not have symptoms. Ask your health care provider if a screening pelvic exam is right for you.  If you have had past treatment for cervical cancer or a condition that could lead to cancer, you need Pap tests and screening for cancer for at least 20 years after your treatment. If Pap tests have been discontinued, your risk factors (such as having a new sexual partner) need to be reassessed to determine if screening should resume. Some women have medical problems that increase the chance  of getting cervical cancer. In these cases,  your health care provider may recommend more frequent screening and Pap tests. Colorectal Cancer  This type of cancer can be detected and often prevented.  Routine colorectal cancer screening usually begins at 71 years of age and continues through 71 years of age.  Your health care provider may recommend screening at an earlier age if you have risk factors for colon cancer.  Your health care provider may also recommend using home test kits to check for hidden blood in the stool.  A small camera at the end of a tube can be used to examine your colon directly (sigmoidoscopy or colonoscopy). This is done to check for the earliest forms of colorectal cancer.  Routine screening usually begins at age 50.  Direct examination of the colon should be repeated every 5-10 years through 71 years of age. However, you may need to be screened more often if early forms of precancerous polyps or small growths are found. Skin Cancer  Check your skin from head to toe regularly.  Tell your health care provider about any new moles or changes in moles, especially if there is a change in a mole's shape or color.  Also tell your health care provider if you have a mole that is larger than the size of a pencil eraser.  Always use sunscreen. Apply sunscreen liberally and repeatedly throughout the day.  Protect yourself by wearing long sleeves, pants, a wide-brimmed hat, and sunglasses whenever you are outside. HEART DISEASE, DIABETES, AND HIGH BLOOD PRESSURE   High blood pressure causes heart disease and increases the risk of stroke. High blood pressure is more likely to develop in:  People who have blood pressure in the high end of the normal range (130-139/85-89 mm Hg).  People who are overweight or obese.  People who are African American.  If you are 18-39 years of age, have your blood pressure checked every 3-5 years. If you are 40 years of age or older, have your blood pressure checked every year. You  should have your blood pressure measured twice--once when you are at a hospital or clinic, and once when you are not at a hospital or clinic. Record the average of the two measurements. To check your blood pressure when you are not at a hospital or clinic, you can use:  An automated blood pressure machine at a pharmacy.  A home blood pressure monitor.  If you are between 55 years and 79 years old, ask your health care provider if you should take aspirin to prevent strokes.  Have regular diabetes screenings. This involves taking a blood sample to check your fasting blood sugar level.  If you are at a normal weight and have a low risk for diabetes, have this test once every three years after 71 years of age.  If you are overweight and have a high risk for diabetes, consider being tested at a younger age or more often. PREVENTING INFECTION  Hepatitis B  If you have a higher risk for hepatitis B, you should be screened for this virus. You are considered at high risk for hepatitis B if:  You were born in a country where hepatitis B is common. Ask your health care provider which countries are considered high risk.  Your parents were born in a high-risk country, and you have not been immunized against hepatitis B (hepatitis B vaccine).  You have HIV or AIDS.  You use needles to inject street drugs.  You   live with someone who has hepatitis B.  You have had sex with someone who has hepatitis B.  You get hemodialysis treatment.  You take certain medicines for conditions, including cancer, organ transplantation, and autoimmune conditions. Hepatitis C  Blood testing is recommended for:  Everyone born from [birth year] through 1966.  Anyone with known risk factors for hepatitis C. Sexually transmitted infections (STIs)  You should be screened for sexually transmitted infections (STIs) including gonorrhea and chlamydia if:  You are sexually active and are younger than 71 years of age.  You  are older than 71 years of age and your health care provider tells you that you are at risk for this type of infection.  Your sexual activity has changed since you were last screened and you are at an increased risk for chlamydia or gonorrhea. Ask your health care provider if you are at risk.  If you do not have HIV, but are at risk, it may be recommended that you take a prescription medicine daily to prevent HIV infection. This is called pre-exposure prophylaxis (PrEP). You are considered at risk if:  You are sexually active and do not regularly use condoms or know the HIV status of your partner(s).  You take drugs by injection.  You are sexually active with a partner who has HIV. Talk with your health care provider about whether you are at high risk of being infected with HIV. If you choose to begin PrEP, you should first be tested for HIV. You should then be tested every 3 months for as long as you are taking PrEP.  OSTEOPOROSIS AND MENOPAUSE   Osteoporosis is a disease in which the bones lose minerals and strength with aging. This can result in serious bone fractures. Your risk for osteoporosis can be identified using a bone density scan.  If you are 75 years of age or older, or if you are at risk for osteoporosis and fractures, ask your health care provider if you should be screened.  Ask your health care provider whether you should take a calcium or vitamin D supplement to lower your risk for osteoporosis.  Menopause may have certain physical symptoms and risks.  Hormone replacement therapy may reduce some of these symptoms and risks. Talk to your health care provider about whether hormone replacement therapy is right for you.  HOME CARE INSTRUCTIONS   Schedule regular health, dental, and eye exams.  Stay current with your immunizations.   Do not use any tobacco products including cigarettes, chewing tobacco, or electronic cigarettes.  If you are pregnant, do not drink  alcohol.  If you are breastfeeding, limit how much and how often you drink alcohol.  Limit alcohol intake to no more than 1 drink per day for nonpregnant women. One drink equals 12 ounces of beer, 5 ounces of wine, or 1 ounces of hard liquor.  Do not use street drugs.  Do not share needles.  Ask your health care provider for help if you need support or information about quitting drugs.  Tell your health care provider if you often feel depressed.  Tell your health care provider if you have ever been abused or do not feel safe at home.   This information is not intended to replace advice given to you by your health care provider. Make sure you discuss any questions you have with your health care provider.   Document Released: 02/12/2011 Document Revised: 08/20/2014 Document Reviewed: 07/01/2013 [employer] Interactive Patient Education [employer].

## 2015-11-29 NOTE — Progress Notes (Signed)
Subjective:    Teresa Medina is a 70 y.o. female who presents for Medicare Annual/Subsequent preventive examination.  Preventive Screening-Counseling & Management  Tobacco History  Smoking status  . Never Smoker   Smokeless tobacco  . Not on file     Problems Prior to Visit 1. See below  Current Problems (verified) Patient Active Problem List   Diagnosis Date Noted  . Postmenopausal 08/31/2015  . Chronic pain syndrome 06/23/2015  . Levoscoliosis 06/23/2015  . Retrolisthesis of vertebrae 06/23/2015  . Degenerative disc disease, lumbar 06/23/2015  . Herniation of intervertebral disc between L4 and L5 06/23/2015  . Type 2 diabetes mellitus (HCC) 06/02/2015  . Essential hypertension 06/02/2015  . Migraine 06/02/2015  . Seasonal allergies 06/02/2015  . History of vitamin D deficiency 06/02/2015  . Osteoporosis 06/02/2015  . GERD (gastroesophageal reflux disease) 06/02/2015    Medications Prior to Visit Current Outpatient Prescriptions on File Prior to Visit  Medication Sig Dispense Refill  . AMBULATORY NON FORMULARY MEDICATION Medication Name: Zostavax IM x 1 vial 1 Units 0  . Cholecalciferol (VITAMIN D-3) 1000 UNITS CAPS Take by mouth.    . DULoxetine (CYMBALTA) 30 MG capsule Take 3 capsules (90 mg total) by mouth daily. 90 capsule 12  . fentaNYL (DURAGESIC - DOSED MCG/HR) 50 MCG/HR Place 1 patch (50 mcg total) onto the skin every 3 (three) days. 10 patch 0  . fluticasone (FLONASE) 50 MCG/ACT nasal spray Place into both nostrils.    Marland Kitchen glucose blood test strip by Other route. Free Style Test Strips    . glyBURIDE (DIABETA) 2.5 MG tablet Take 1 tablet (2.5 mg total) by mouth daily with breakfast. 90 tablet 3  . losartan (COZAAR) 100 MG tablet Take 1 tablet (100 mg total) by mouth daily. 90 tablet 0  . metoprolol succinate (TOPROL XL) 50 MG 24 hr tablet Take 1 tablet (50 mg total) by mouth daily. Take with or immediately following a meal. 90 tablet 1  . montelukast  (SINGULAIR) 10 MG tablet Take 1 tablet (10 mg total) by mouth at bedtime. 90 tablet 1  . RABEprazole (ACIPHEX) 20 MG tablet Take 1 tablet (20 mg total) by mouth daily. 90 tablet 1  . SUMAtriptan (IMITREX) 100 MG tablet Take 1 tablet (100 mg) by mouth as needed for migraine. May repeat in 2 hours if headache persists or recurs. 30 tablet 3  . topiramate (TOPAMAX) 50 MG tablet Take 1 tablet (50 mg total) by mouth 2 (two) times daily. 180 tablet 2   No current facility-administered medications on file prior to visit.      Allergies (verified) Review of patient's allergies indicates no known allergies.   PAST HISTORY  Family History Family History  Problem Relation Age of Onset  . Diabetes Sister   . Heart Problems Sister     multiple bypass surgeries  . Cancer Sister   . Depression Brother   . Heart Problems Brother     bad heart use oxygen    Social History Social History  Substance Use Topics  . Smoking status: Never Smoker   . Smokeless tobacco: Not on file  . Alcohol Use: No     Are there smokers in your home (other than you)? Yes - he smokes outside   Risk Factors Current exercise habits: none  Dietary issues discussed: yes   Cardiac risk factors: advanced age (older than 17 for men, 73 for women), diabetes mellitus, dyslipidemia, hypertension, sedentary lifestyle and smoking/ tobacco exposure.  Depression Screen (  Note: if answer to either of the following is "Yes", a more complete depression screening is indicated)   Over the past two weeks, have you felt down, depressed or hopeless? No  Over the past two weeks, have you felt little interest or pleasure in doing things? No  Have you lost interest or pleasure in daily life? No  Do you often feel hopeless? No  Do you cry easily over simple problems? No  Depression moods due to deaths of husband, sister, friendship.   Activities of Daily Living In your present state of health, do you have any difficulty performing  the following activities?:  Driving? No Managing money?  No Feeding yourself? No Getting from bed to chair? No Climbing a flight of stairs? No Preparing food and eating?: No Bathing or showering? No Getting dressed: No Getting to the toilet? No Using the toilet:No Moving around from place to place: No In the past year have you fallen or had a near fall?:Yes - mechanical fall tripping over something at home - this was more than a year ago    Are you sexually active?  No  Do you have more than one partner?  No  Hearing Difficulties: No Do you often ask people to speak up or repeat themselves? No Do you experience ringing or noises in your ears? No Do you have difficulty understanding soft or whispered voices? Sometimes    Do you feel that you have a problem with memory? Yes  Do you often misplace items? No  Do you feel safe at home?  Yes  Cognitive Testing  Alert? Yes  Normal Appearance?Yes  Oriented to person? Yes  Place? Doctor's office  Time? Yes  Recall of three objects?  Yes  Can perform simple calculations? Yes  Displays appropriate judgment?Yes  Can read the correct time from a watch face?Yes   Advanced Directives have been discussed with the patient? Yes  List the Names of Other Physician/Practitioners you currently use: 1.  No other specialists    Indicate any recent Medical Services you may have received from other than [facility] providers in the past year (date may be approximate).  Immunization History  Administered Date(s) Administered  . Influenza-Unspecified 04/28/2015  . Pneumococcal Conjugate-13 06/04/2014  . Pneumococcal Polysaccharide-23 09/20/2010    Screening Tests Health Maintenance  Topic Date Due  . TETANUS/TDAP  09/04/1963  . COLONOSCOPY  09/03/1994  . ZOSTAVAX  09/03/2004  . OPHTHALMOLOGY EXAM  04/09/2015  . DEXA SCAN  08/13/2016 (Originally 09/03/2009)  . HEMOGLOBIN A1C  02/28/2016  . INFLUENZA VACCINE  03/13/2016  . FOOT EXAM  08/30/2016   . MAMMOGRAM  01/12/2017  . Hepatitis C Screening  Addressed  . PNA vac Low Risk Adult  Completed    All answers were reviewed with the patient and necessary referrals were made:  Sunnie NielsenNatalie Kateria Cutrona, DO   11/29/2015   History reviewed: allergies, current medications, past family history, past medical history, past social history, past surgical history and problem list  Review of Systems Pertinent items noted in HPI and remainder of comprehensive ROS otherwise negative.    Objective:     Vision by Snellen chart: right eye:20/40, left eye:20/40     Body mass index is 37.49 kg/(m^2). BP 136/84 mmHg  Pulse 60  Ht 5\' 2"  (1.575 m)  Wt 205 lb (92.987 kg)  BMI 37.49 kg/m2  BP 136/84 mmHg  Pulse 60  Ht 5\' 2"  (1.575 m)  Wt 205 lb (92.987 kg)  BMI 37.49  kg/m2 General appearance: alert, cooperative, appears stated age and no distress Head: Normocephalic, without obvious abnormality, atraumatic Heart: regular rate and rhythm, S1, S2 normal, no murmur, click, rub or gallop Neurologic: Alert and oriented X 3, normal strength and tone. Normal symmetric reflexes. Normal coordination and gait        Results for orders placed or performed in visit on 11/29/15 (from the past 24 hour(s))  POCT HgB A1C     Status: None   Collection Time: 11/29/15  2:08 PM  Result Value Ref Range   Hemoglobin A1C 6.6     Assessment:   MEDICARE ANNUAL WELLNESS   Decline DEXA Would like Cologuard for CCS Tetanus updated today She got shingles vax after last visit A1C updated - pt would like to stay on same meds and work on diet Fall risk info printed. Health maintenance infor printed.  Given info re: Advance Directives and pt encouraged to discuss this with her family, come back for visit to go over the paperwork if needed.       Plan:     During the course of the visit the patient was educated and counseled about appropriate screening and preventive services including:    Td vaccine  Diet  review for nutrition referral? Yes __X__  Not Indicated __X__   Patient Instructions (the written plan) was given to the patient.  Medicare Attestation I have personally reviewed: The patient's medical and social history Their use of alcohol, tobacco or illicit drugs Their current medications and supplements The patient's functional ability including ADLs,fall risks, home safety risks, cognitive, and hearing and visual impairment Diet and physical activities Evidence for depression or mood disorders  The patient's weight, height, BMI, and visual acuity have been recorded in the chart.  I have made referrals, counseling, and provided education to the patient based on review of the above and I have provided the patient with a written personalized care plan for preventive services.     Sunnie Nielsen, DO   11/29/2015

## 2015-12-03 ENCOUNTER — Other Ambulatory Visit: Payer: Self-pay | Admitting: Osteopathic Medicine

## 2015-12-04 ENCOUNTER — Other Ambulatory Visit: Payer: Self-pay | Admitting: Osteopathic Medicine

## 2015-12-16 ENCOUNTER — Other Ambulatory Visit: Payer: Self-pay | Admitting: Osteopathic Medicine

## 2015-12-23 ENCOUNTER — Encounter: Payer: Self-pay | Admitting: Osteopathic Medicine

## 2015-12-23 DIAGNOSIS — Z1211 Encounter for screening for malignant neoplasm of colon: Secondary | ICD-10-CM

## 2016-01-02 ENCOUNTER — Other Ambulatory Visit: Payer: Self-pay | Admitting: Osteopathic Medicine

## 2016-01-03 MED ORDER — FENTANYL 50 MCG/HR TD PT72: 50.0000 ug | MEDICATED_PATCH | TRANSDERMAL | Status: DC

## 2016-01-03 NOTE — Addendum Note (Signed)
Addended by: Pixie CasinoUNNINGHAM, Samanth Mirkin C on: 01/03/2016 01:13 PM   Modules accepted: Orders

## 2016-01-11 ENCOUNTER — Other Ambulatory Visit: Payer: Self-pay | Admitting: Osteopathic Medicine

## 2016-01-25 ENCOUNTER — Encounter: Payer: Self-pay | Admitting: Osteopathic Medicine

## 2016-01-25 ENCOUNTER — Other Ambulatory Visit: Payer: Self-pay | Admitting: Osteopathic Medicine

## 2016-01-26 MED ORDER — FENTANYL 50 MCG/HR TD PT72: 50.0000 ug | MEDICATED_PATCH | TRANSDERMAL | Status: DC

## 2016-02-17 ENCOUNTER — Other Ambulatory Visit: Payer: Self-pay | Admitting: Osteopathic Medicine

## 2016-02-28 ENCOUNTER — Other Ambulatory Visit: Payer: Self-pay | Admitting: Osteopathic Medicine

## 2016-03-05 ENCOUNTER — Ambulatory Visit (INDEPENDENT_AMBULATORY_CARE_PROVIDER_SITE_OTHER): Payer: Medicare Other | Admitting: Osteopathic Medicine

## 2016-03-05 ENCOUNTER — Encounter: Payer: Self-pay | Admitting: Osteopathic Medicine

## 2016-03-05 VITALS — BP 115/75 | HR 73 | Wt 208.0 lb

## 2016-03-05 DIAGNOSIS — E1121 Type 2 diabetes mellitus with diabetic nephropathy: Secondary | ICD-10-CM

## 2016-03-05 DIAGNOSIS — E119 Type 2 diabetes mellitus without complications: Secondary | ICD-10-CM

## 2016-03-05 DIAGNOSIS — E559 Vitamin D deficiency, unspecified: Secondary | ICD-10-CM

## 2016-03-05 DIAGNOSIS — Z1211 Encounter for screening for malignant neoplasm of colon: Secondary | ICD-10-CM

## 2016-03-05 DIAGNOSIS — G894 Chronic pain syndrome: Secondary | ICD-10-CM | POA: Diagnosis not present

## 2016-03-05 LAB — POCT GLYCOSYLATED HEMOGLOBIN (HGB A1C): Hemoglobin A1C: 6.6

## 2016-03-05 MED ORDER — GLYBURIDE 2.5 MG PO TABS: 2.5000 mg | ORAL_TABLET | Freq: Every day | ORAL | 3 refills | Status: DC

## 2016-03-05 MED ORDER — FENTANYL 50 MCG/HR TD PT72: 50.0000 ug | MEDICATED_PATCH | TRANSDERMAL | 0 refills | Status: DC

## 2016-03-05 NOTE — Progress Notes (Signed)
HPI: Teresa Medina is a 70 y.o. Unavailable female  who presents to [facility] today, 03/05/16,  for chief complaint of:  Chief Complaint  Patient presents with  . Follow-up    DIABETES    DIABETES - Fairly well controlled on current medications as below up-to-date on major screening as below-  ASCVD RISK - greater than 10%, patient states she used to be on aspirin, isn't currently taking this. Adamantly declines statin therapy    Past medical, surgical, social and family history reviewed: Past Medical History:  Diagnosis Date  . Arthritis   . Chronic pain syndrome 06/23/2015   Records reviewed: pt has been on Fentanyl TD, Cymbalta, Flexeril  . Degenerative disc disease, lumbar 06/23/2015   MRI 06/2014 most severe L4-5  . Diabetes mellitus without complication (HCC)   . Essential hypertension 06/02/2015  . GERD (gastroesophageal reflux disease) 06/02/2015  . Herniation of intervertebral disc between L4 and L5 06/23/2015   MRI 06/2014 moderate to severe R foraminal narrowing  . History of vitamin D deficiency 06/02/2015  . Hypertension   . Levoscoliosis 06/23/2015   Mild, MRI 06/22/14  . Migraine   . Migraine 06/02/2015  . Osteoporosis 06/02/2015  . Retrolisthesis of vertebrae 06/23/2015   Gr1 L1 on L2 MRI 06/2014  . Seasonal allergies 06/02/2015  . Type 2 diabetes mellitus (HCC) 06/02/2015   No past surgical history on file. Social History  Substance Use Topics  . Smoking status: Never Smoker  . Smokeless tobacco: Not on file  . Alcohol use No   Family History  Problem Relation Age of Onset  . Diabetes Sister   . Heart Problems Sister     multiple bypass surgeries  . Cancer Sister   . Depression Brother   . Heart Problems Brother     bad heart use oxygen     Current medication list and allergy/intolerance information reviewed:   Current Outpatient Prescriptions  Medication Sig Dispense Refill  . AMBULATORY NON  FORMULARY MEDICATION Medication Name: Zostavax IM x 1 vial 1 Units 0  . Calcium Carbonate-Vitamin D 600-400 MG-UNIT chew tablet Chew 2 tablets by mouth daily. 60 tablet 12  . Cholecalciferol (VITAMIN D-3) 1000 UNITS CAPS Take by mouth.    . cyclobenzaprine (FLEXERIL) 10 MG tablet TAKE ONE TABLET BY MOUTH THREE TIMES DAILY 90 tablet 0  . DULoxetine (CYMBALTA) 30 MG capsule Take 3 capsules (90 mg total) by mouth daily. 90 capsule 12  . fentaNYL (DURAGESIC - DOSED MCG/HR) 50 MCG/HR Place 1 patch (50 mcg total) onto the skin every 3 (three) days. Early refill given 01/26/2016 due to travel - next monthly refill will NOT be authorized before 03/01/2016 10 patch 0  . fluticasone (FLONASE) 50 MCG/ACT nasal spray Place into both nostrils.    Marland Kitchen glucose blood test strip by Other route. Free Style Test Strips    . glyBURIDE (DIABETA) 2.5 MG tablet Take 1 tablet (2.5 mg total) by mouth daily with breakfast. 90 tablet 3  . losartan (COZAAR) 100 MG tablet Take 1 tablet (100 mg total) by mouth daily. 90 tablet 1  . metoprolol succinate (TOPROL-XL) 50 MG 24 hr tablet TAKE ONE TABLET BY MOUTH DAILY. TAKE WITH MEALS 90 tablet 0  . montelukast (SINGULAIR) 10 MG tablet TAKE ONE TABLET BY MOUTH AT BEDTIME 90 tablet 0  . RABEprazole (ACIPHEX) 20 MG tablet TAKE ONE TABLET BY MOUTH DAILY 90 tablet 0  . SUMAtriptan (IMITREX) 100 MG tablet Take 1 tablet (  100 mg) by mouth as needed for migraine. May repeat in 2 hours if headache persists or recurs. 30 tablet 3  . topiramate (TOPAMAX) 50 MG tablet TAKE ONE TABLET BY MOUTH TWICE DAILY 180 tablet 0  . ZOSTAVAX 07371 UNT/0.65ML injection ADM 0.65ML  UTD  0   No current facility-administered medications for this visit.    No Known Allergies    Review of Systems:  Constitutional:  No  fever, no chills, No recent illness,  HEENT: No  headache, no vision change  Cardiac: No  chest pain, No  pressure  Respiratory:  No  shortness of breath.  Musculoskeletal: No new  myalgia/arthralgia, (+) chronic back pain   Neurologic: No  weakness, No  dizziness  Exam:  Wt 208 lb (94.3 kg)   BMI 38.04 kg/m   Constitutional: VS see above. General Appearance: alert, well-developed, well-nourished, NAD  Ears, Nose, Mouth, Throat: MMM, Normal external inspection ears/nares/mouth/lips/gums.   Neck: No masses, trachea midline.   Respiratory: Normal respiratory effort. no wheeze, no rhonchi, no rales  Cardiovascular: S1/S2 normal, no murmur, no rub/gallop auscultated. RRR. No lower extremity edema.   Psychiatric: Normal judgment/insight. Normal mood and affect. Oriented x3.    Results for orders placed or performed in visit on 03/05/16 (from the past 72 hour(s))  POCT HgB A1C     Status: None   Collection Time: 03/05/16  1:48 PM  Result Value Ref Range   Hemoglobin A1C 6.6       ASSESSMENT/PLAN:   Continue current diabetic medication regimen, initiate aspirin for ASCVD risk >10%. Patient declines statin use.  Type 2 diabetes mellitus with nephropathy (HCC) - Plan: POCT HgB A1C  Colon cancer screening - patient that she did not receive fecal DNA home testing, will look into this  Chronic pain syndrome  Type 2 diabetes mellitus without complication, without long-term current use of insulin (HCC) - Plan: glyBURIDE (DIABETA) 2.5 MG tablet  Type 2 diabetes with nephropathy (HCC)    DIABETES SCREENING/PREVENTIVE CARE: Updated 03/05/16  A1C past 3-6 mos: Yes, 03/05/16 6.6% controlled? Yes BP goal <140/90: Yes  LDL goal <70: no - 100 11/24/15 Eye exam annually: Yes , importance discussed with patient Foot exam:  08/31/15 Microalbuminuria:   not applicable, patient is on ACE/ARB Metformin: No - declines ACE/ARB: Yes  Antiplatelet if ASCVD Risk >10%: Recommended Statin: No    Visit summary with medication list and pertinent instructions was printed for patient to review. All questions at time of visit were answered - patient instructed to contact  office with any additional concerns. ER/RTC precautions were reviewed with the patient. Follow-up plan: Return in about 6 months (around 09/05/2016), or sooner if needed, for [facility] EXAM, RECHECK A1C.  Note: Total time spent 25 minutes, greater than 50% of the visit was spent face-to-face counseling and coordinating care for the following: The encounter diagnosis was Type 2 diabetes mellitus with nephropathy (HCC)., with also discussed cardiac risk/ASCVD risk factors. Labs were placed for previsit blood draw prior to annual physical in 6 months

## 2016-03-20 ENCOUNTER — Other Ambulatory Visit: Payer: Self-pay | Admitting: Osteopathic Medicine

## 2016-04-09 ENCOUNTER — Encounter: Payer: Self-pay | Admitting: Osteopathic Medicine

## 2016-04-09 ENCOUNTER — Other Ambulatory Visit: Payer: Self-pay | Admitting: Osteopathic Medicine

## 2016-04-09 DIAGNOSIS — G894 Chronic pain syndrome: Secondary | ICD-10-CM

## 2016-04-10 ENCOUNTER — Other Ambulatory Visit: Payer: Self-pay

## 2016-04-10 DIAGNOSIS — G894 Chronic pain syndrome: Secondary | ICD-10-CM

## 2016-04-10 MED ORDER — FENTANYL 50 MCG/HR TD PT72: 50.0000 ug | MEDICATED_PATCH | TRANSDERMAL | 0 refills | Status: DC

## 2016-05-16 ENCOUNTER — Other Ambulatory Visit: Payer: Self-pay

## 2016-05-16 DIAGNOSIS — G894 Chronic pain syndrome: Secondary | ICD-10-CM

## 2016-05-16 MED ORDER — FENTANYL 50 MCG/HR TD PT72: 50.0000 ug | MEDICATED_PATCH | TRANSDERMAL | 0 refills | Status: DC

## 2016-05-16 NOTE — Telephone Encounter (Signed)
Patient request refill for  Fentanyl 50 mcg. Patient stated that her husband will pick up Rx this afternoon. Rhonda Cunningham,CMA

## 2016-05-28 ENCOUNTER — Other Ambulatory Visit: Payer: Self-pay | Admitting: Osteopathic Medicine

## 2016-05-28 DIAGNOSIS — I10 Essential (primary) hypertension: Secondary | ICD-10-CM

## 2016-06-19 ENCOUNTER — Other Ambulatory Visit: Payer: Self-pay | Admitting: Osteopathic Medicine

## 2016-06-19 DIAGNOSIS — G894 Chronic pain syndrome: Secondary | ICD-10-CM

## 2016-06-19 MED ORDER — FENTANYL 50 MCG/HR TD PT72: 50.0000 ug | MEDICATED_PATCH | TRANSDERMAL | 0 refills | Status: DC

## 2016-06-19 NOTE — Telephone Encounter (Signed)
Patient request refill for Fentanyl 50 mcg. Please advise. Rhonda Cunningham,CMA

## 2016-06-19 NOTE — Telephone Encounter (Signed)
Ok to refill.  When you call to let her know it's ready for pickup, can you please also let her know that she will need to come in sometime prior to the end of the year for completion of a controlled substance agreement. Remind her this will be state law starting next year anyone on chronic opioid therapy will need to have something like this on file with our office before refills can be authorized.

## 2016-06-19 NOTE — Addendum Note (Signed)
Addended by: Pixie CasinoUNNINGHAM, Estefan Pattison C on: 06/19/2016 04:02 PM   Modules accepted: Orders

## 2016-06-19 NOTE — Telephone Encounter (Signed)
Left detailed message on patient vm with exact detailed message as noted below. Rhonda Cunningham,CMA

## 2016-07-11 DIAGNOSIS — E119 Type 2 diabetes mellitus without complications: Secondary | ICD-10-CM | POA: Diagnosis not present

## 2016-07-11 DIAGNOSIS — H25813 Combined forms of age-related cataract, bilateral: Secondary | ICD-10-CM | POA: Diagnosis not present

## 2016-07-11 DIAGNOSIS — H04123 Dry eye syndrome of bilateral lacrimal glands: Secondary | ICD-10-CM | POA: Diagnosis not present

## 2016-07-11 DIAGNOSIS — H527 Unspecified disorder of refraction: Secondary | ICD-10-CM | POA: Diagnosis not present

## 2016-07-11 DIAGNOSIS — H02834 Dermatochalasis of left upper eyelid: Secondary | ICD-10-CM | POA: Diagnosis not present

## 2016-07-11 LAB — HM DIABETES EYE EXAM

## 2016-07-13 ENCOUNTER — Other Ambulatory Visit: Payer: Self-pay | Admitting: Osteopathic Medicine

## 2016-07-23 ENCOUNTER — Other Ambulatory Visit: Payer: Self-pay | Admitting: Osteopathic Medicine

## 2016-07-24 ENCOUNTER — Encounter: Payer: Self-pay | Admitting: Osteopathic Medicine

## 2016-07-24 ENCOUNTER — Other Ambulatory Visit: Payer: Self-pay | Admitting: Osteopathic Medicine

## 2016-07-24 DIAGNOSIS — G894 Chronic pain syndrome: Secondary | ICD-10-CM

## 2016-07-25 MED ORDER — METOPROLOL SUCCINATE ER 50 MG PO TB24: 50.0000 mg | ORAL_TABLET | Freq: Every day | ORAL | 1 refills | Status: DC

## 2016-07-25 MED ORDER — MONTELUKAST SODIUM 10 MG PO TABS: 10.0000 mg | ORAL_TABLET | Freq: Every day | ORAL | 3 refills | Status: DC

## 2016-07-25 MED ORDER — FENTANYL 50 MCG/HR TD PT72: 50.0000 ug | MEDICATED_PATCH | TRANSDERMAL | 0 refills | Status: DC

## 2016-07-26 NOTE — Telephone Encounter (Signed)
Spoke with Pt. She does not come to pick up her Rx refills, her daughter-in-law does. Advised at her appt in January that she will need to sign her pain contact. Verbalized understanding. No further questions at this time.

## 2016-08-28 ENCOUNTER — Other Ambulatory Visit: Payer: Self-pay | Admitting: Osteopathic Medicine

## 2016-08-28 DIAGNOSIS — G894 Chronic pain syndrome: Secondary | ICD-10-CM

## 2016-08-28 MED ORDER — FENTANYL 50 MCG/HR TD PT72: 50.0000 ug | MEDICATED_PATCH | TRANSDERMAL | 0 refills | Status: DC

## 2016-08-28 NOTE — Telephone Encounter (Signed)
Daughter in-law advised. 

## 2016-08-31 DIAGNOSIS — H25813 Combined forms of age-related cataract, bilateral: Secondary | ICD-10-CM | POA: Diagnosis not present

## 2016-08-31 DIAGNOSIS — H527 Unspecified disorder of refraction: Secondary | ICD-10-CM | POA: Diagnosis not present

## 2016-08-31 DIAGNOSIS — H01009 Unspecified blepharitis unspecified eye, unspecified eyelid: Secondary | ICD-10-CM | POA: Diagnosis not present

## 2016-08-31 DIAGNOSIS — H04123 Dry eye syndrome of bilateral lacrimal glands: Secondary | ICD-10-CM | POA: Diagnosis not present

## 2016-09-04 ENCOUNTER — Other Ambulatory Visit: Payer: Self-pay | Admitting: Osteopathic Medicine

## 2016-09-04 DIAGNOSIS — I10 Essential (primary) hypertension: Secondary | ICD-10-CM

## 2016-09-05 ENCOUNTER — Other Ambulatory Visit: Payer: Self-pay | Admitting: Osteopathic Medicine

## 2016-09-05 ENCOUNTER — Ambulatory Visit (INDEPENDENT_AMBULATORY_CARE_PROVIDER_SITE_OTHER): Payer: Medicare Other | Admitting: Osteopathic Medicine

## 2016-09-05 ENCOUNTER — Encounter: Payer: Self-pay | Admitting: Osteopathic Medicine

## 2016-09-05 VITALS — BP 110/80 | HR 85 | Wt 213.0 lb

## 2016-09-05 DIAGNOSIS — Z0000 Encounter for general adult medical examination without abnormal findings: Secondary | ICD-10-CM

## 2016-09-05 DIAGNOSIS — M4310 Spondylolisthesis, site unspecified: Secondary | ICD-10-CM

## 2016-09-05 DIAGNOSIS — M5136 Other intervertebral disc degeneration, lumbar region: Secondary | ICD-10-CM

## 2016-09-05 DIAGNOSIS — M8580 Other specified disorders of bone density and structure, unspecified site: Secondary | ICD-10-CM

## 2016-09-05 DIAGNOSIS — E1121 Type 2 diabetes mellitus with diabetic nephropathy: Secondary | ICD-10-CM

## 2016-09-05 DIAGNOSIS — Z23 Encounter for immunization: Secondary | ICD-10-CM

## 2016-09-05 DIAGNOSIS — I10 Essential (primary) hypertension: Secondary | ICD-10-CM

## 2016-09-05 DIAGNOSIS — Z1239 Encounter for other screening for malignant neoplasm of breast: Secondary | ICD-10-CM

## 2016-09-05 DIAGNOSIS — G43809 Other migraine, not intractable, without status migrainosus: Secondary | ICD-10-CM | POA: Diagnosis not present

## 2016-09-05 LAB — POCT GLYCOSYLATED HEMOGLOBIN (HGB A1C): HEMOGLOBIN A1C: 6.8

## 2016-09-05 MED ORDER — LOSARTAN POTASSIUM 100 MG PO TABS: 100.0000 mg | ORAL_TABLET | Freq: Every day | ORAL | 3 refills | Status: DC

## 2016-09-05 NOTE — Progress Notes (Signed)
HPI: Teresa Medina is a 71 y.o. female  who presents to [facility] today, 09/05/16,  for Medicare Annual Wellness Exam  Patient presents for annual physical/Medicare wellness exam. Migraine and shoulder complaints today.  Got flu shot already Son has some questions about preventive care measures due. Larey Seat 08/02/16, still having some shoulder tenderness L shoulder but doesn't want PT.  Migraines: propranolol was not helpful, topamax was helpful but not as much, also previously on amitriptyline. Headaches a bit more frequently at this point.    Chronic pain: needs to sign controlled substance contract today. [place] database reviewed and consistent with prescriptions, last fill 08/28/16  Diabetes followup:  Results for orders placed or performed in visit on 09/05/16 (from the past 48 hour(s))  POCT HgB A1C     Status: None   Collection Time: 09/05/16  2:17 PM  Result Value Ref Range   Hemoglobin A1C 6.8         Past medical, surgical, social and family history reviewed:  Patient Active Problem List   Diagnosis Date Noted  . Type 2 diabetes with nephropathy (HCC) 11/29/2015  . Chronic kidney disease (CKD) 11/29/2015  . Hx of bone density study 11/29/2015  . Postmenopausal 08/31/2015  . Chronic pain syndrome 06/23/2015  . Levoscoliosis 06/23/2015  . Retrolisthesis of vertebrae 06/23/2015  . Degenerative disc disease, lumbar 06/23/2015  . Herniation of intervertebral disc between L4 and L5 06/23/2015  . Type 2 diabetes mellitus (HCC) 06/02/2015  . Essential hypertension 06/02/2015  . Migraine 06/02/2015  . Seasonal allergies 06/02/2015  . History of vitamin D deficiency 06/02/2015  . Osteoporosis 06/02/2015  . GERD (gastroesophageal reflux disease) 06/02/2015    No past surgical history on file.  Social History   Social History  . Marital status: Widowed    Spouse name: N/A  . Number of children: N/A  . Years of education: N/A    Occupational History  . Not on file.   Social History Main Topics  . Smoking status: Never Smoker  . Smokeless tobacco: Never Used  . Alcohol use No  . Drug use: No  . Sexual activity: Not on file   Other Topics Concern  . Not on file   Social History Narrative  . No narrative on file    Family History  Problem Relation Age of Onset  . Diabetes Sister   . Heart Problems Sister     multiple bypass surgeries  . Cancer Sister   . Depression Brother   . Heart Problems Brother     bad heart use oxygen     Current medication list and allergy/intolerance information reviewed:    Outpatient Encounter Prescriptions as of 09/05/2016  Medication Sig Note  . AMBULATORY NON FORMULARY MEDICATION Medication Name: Zostavax IM x 1 vial   . Calcium Carbonate-Vitamin D 600-400 MG-UNIT chew tablet Chew 2 tablets by mouth daily.   . Cholecalciferol (VITAMIN D-3) 1000 UNITS CAPS Take by mouth.   . cyclobenzaprine (FLEXERIL) 10 MG tablet TAKE ONE TABLET BY MOUTH THREE TIMES DAILY   . DULoxetine (CYMBALTA) 30 MG capsule Take 3 capsules (90 mg total) by mouth daily.   . fentaNYL (DURAGESIC - DOSED MCG/HR) 50 MCG/HR Place 1 patch (50 mcg total) onto the skin every 3 (three) days.   . fluticasone (FLONASE) 50 MCG/ACT nasal spray Place into both nostrils.   Marland Kitchen glucose blood test strip by Other route. Free Style Test Strips   . glyBURIDE (DIABETA) 2.5 MG  tablet Take 1 tablet (2.5 mg total) by mouth daily with breakfast.   . losartan (COZAAR) 100 MG tablet Take 1 tablet (100 mg total) by mouth daily. NEED FOLLOW UP APPOINTMENT FOR MORE REFILLS   . metoprolol succinate (TOPROL-XL) 50 MG 24 hr tablet Take 1 tablet (50 mg total) by mouth daily. Take with or immediately following a meal.   . montelukast (SINGULAIR) 10 MG tablet Take 1 tablet (10 mg total) by mouth at bedtime.   . RABEprazole (ACIPHEX) 20 MG tablet TAKE ONE TABLET BY MOUTH DAILY   . SUMAtriptan (IMITREX) 100 MG tablet Take 1 tablet (100  mg) by mouth as needed for migraine. May repeat in 2 hours if headache persists or recurs.   . topiramate (TOPAMAX) 50 MG tablet TAKE ONE TABLET BY MOUTH TWICE DAILY   . ZOSTAVAX [id number] UNT/0.65ML injection ADM 0.65ML [place] UTD 11/29/2015: Received from: External Pharmacy   No facility-administered encounter medications on file as of 09/05/2016.     No Known Allergies     Review of Systems: Review of Systems - History obtained from child and the patient General ROS: positive for  - fatigue Psychological ROS: negative ENT ROS: negative Respiratory ROS: no cough, shortness of breath, or wheezing Cardiovascular ROS: no chest pain or dyspnea on exertion Gastrointestinal ROS: no abdominal pain, change in bowel habits, or black or bloody stools   Medicare Wellness Questionnaire  Are there smokers in your home (other than you)? no  Depression Screen (Note: if answer to either of the following is "Yes", a more complete depression screening is indicated)   Q1: Over the past two weeks, have you felt down, depressed or hopeless? no  Q2: Over the past two weeks, have you felt little interest or pleasure in doing things? no  Have you lost interest or pleasure in daily life? no  Do you often feel hopeless? no  Do you cry easily over simple problems? no  Activities of Daily Living In your present state of health, do you have any difficulty performing the following activities?:  Driving? no Managing money?  no Feeding yourself? no Getting from bed to chair? no Climbing a flight of stairs? no Preparing food and eating?: no Bathing or showering? no Getting dressed: no Getting to the toilet? no Using the toilet: no Moving around from place to place: no In the past year have you fallen or had a near fall?: yes  Hearing Difficulties:  Do you often ask people to speak up or repeat themselves? yes Do you experience ringing or noises in your ears? no  Do you have difficulty understanding soft or  whispered voices? yes  Memory Difficulties:  Do you feel that you have a problem with memory? yes  Do you often misplace items? yes  Do you feel safe at home?  yes  Sexual Health:   Are you sexually active?  Yes  Do you have more than one partner?  No  Advanced Directives:   Advanced directives discussed: has NO advanced directive  - add't info requested. Referral to SW: not applicable  Additional information provided: yes  Risk Factors  Current exercise habits:   Dietary issues discussed:no concerns  Cardiac risk factors: Diabetes Mellitus, generalized debilitation   Exam:  BP (!) 139/114   Pulse 85   Wt 213 lb (96.6 kg)   BMI 38.96 kg/m  Vision by Snellen chart: right eye:see nurse notes, left eye:see nurse notes  Constitutional: VS see above. General Appearance: alert, well-developed,  well-nourished, NAD  Ears, Nose, Mouth, Throat: MMM  Neck: No masses, trachea midline.   Respiratory: Normal respiratory effort. no wheeze, no rhonchi, no rales  Cardiovascular:No lower extremity edema.   Musculoskeletal: Gait normal. .   Neurological: Normal balance/coordination. No tremor. able to read face of watch with correct time.   Skin: warm, dry, intact. No rash/ulcer.   Psychiatric: Normal judgment/insight. Normal mood and affect. Oriented x3.     ASSESSMENT/PLAN:   Encounter for Medicare annual wellness exam  Medicare annual wellness visit, subsequent  Type 2 diabetes with nephropathy (HCC) - Plan: POCT HgB A1C, CBC with Differential/Platelet, COMPLETE METABOLIC PANEL WITH GFR, Lipid panel  Essential hypertension - Plan: losartan (COZAAR) 100 MG tablet  Retrolisthesis of vertebrae - Plan: Drug Screen, Urine  Degenerative disc disease, lumbar - Plan: Drug Screen, Urine  Need for prophylactic vaccination and inoculation against influenza - Plan: Flu Vaccine QUAD 36+ mos IM  Osteopenia, unspecified location - Plan: VITAMIN D 25 Hydroxy (Vit-D Deficiency,  Fractures), DG Bone Density  Breast cancer screening - Plan: MM DIGITAL SCREENING BILATERAL  Other migraine without status migrainosus, not intractable - Plan: Ambulatory referral to Neurology   CANCER SCREENING  Lung - does not need  Colon - needs - opts for Cologuard   Prostate - does not need  Breast - needs  Cervical - does not need OTHER DISEASE SCREENING  Lipid - needs  DM2 - does not need  AAA - female 70-75yo ever smoked - does not need  Osteoporosis - women 72yo+, men 72yo+ - needs INFECTIOUS DISEASE SCREENING  HIV - does not need  GC/CT - does not need  HepC -does not need  TB - does not need ADULT VACCINATION  Influenza - annual vaccine recommended  Td - booster every 10 years - does not need  Zoster - option at 49, yes at 6+ - does not need  PCV13 - does not need  PPSV23 - does not need Immunization History  Administered Date(s) Administered  . Influenza-Unspecified 04/28/2015  . Pneumococcal Conjugate-13 06/04/2014  . Pneumococcal Polysaccharide-23 09/20/2010  . Tdap 11/29/2015   OTHER  Fall - exercise and Vit D age 43+ - needs  Advanced Directives -  Discussed as above   During the course of the visit the patient was educated and counseled about appropriate screening and preventive services as noted above.   Patient Instructions (the written plan) was given to the patient.  Medicare Attestation I have personally reviewed: The patient's medical and social history Their use of alcohol, tobacco or illicit drugs Their current medications and supplements The patient's functional ability including ADLs,fall risks, home safety risks, cognitive, and hearing and visual impairment Diet and physical activities Evidence for depression or mood disorders  The patient's weight, height, BMI, and visual acuity have been recorded in the chart.  I have made referrals, counseling, and provided education to the patient based on review of the above and  I have provided the patient with a written personalized care plan for preventive services.     Sunnie Nielsen, DO   09/05/16   Visit summary with medication list and pertinent instructions was printed for patient to review. All questions at time of visit were answered - patient instructed to contact office with any additional concerns. ER/RTC precautions were reviewed with the patient. Follow-up plan: No Follow-up on file.

## 2016-09-06 LAB — PAIN MGMT, PROFILE 6 CONF W/O MM, U
6 ACETYLMORPHINE: NEGATIVE ng/mL (ref <10)
ALCOHOL METABOLITES: NEGATIVE ng/mL (ref <500)
Amphetamines: NEGATIVE ng/mL (ref <500)
BARBITURATES: NEGATIVE ng/mL (ref <300)
Benzodiazepines: NEGATIVE ng/mL (ref <100)
COCAINE METABOLITE: NEGATIVE ng/mL (ref <150)
Creatinine: 101.5 mg/dL (ref >=20.0)
METHADONE METABOLITE: NEGATIVE ng/mL (ref <100)
Marijuana Metabolite: NEGATIVE ng/mL (ref <20)
OXYCODONE: NEGATIVE ng/mL (ref <100)
Opiates: NEGATIVE ng/mL (ref <100)
Oxidant: NEGATIVE ug/mL (ref <200)
PHENCYCLIDINE: NEGATIVE ng/mL (ref <25)
Please note:: 0
pH: 6.84 (ref 4.5–9.0)

## 2016-09-07 ENCOUNTER — Other Ambulatory Visit: Payer: Self-pay | Admitting: Osteopathic Medicine

## 2016-09-13 ENCOUNTER — Telehealth: Payer: Self-pay

## 2016-09-13 MED ORDER — BENZONATATE 200 MG PO CAPS: 200.0000 mg | ORAL_CAPSULE | Freq: Three times a day (TID) | ORAL | 0 refills | Status: DC | PRN

## 2016-09-13 NOTE — Telephone Encounter (Signed)
Teresa Medina called and complains of a productive cough with green sputum and runny nose with green sputum. No fever, chills, sweats, sore throat or ear pain. She would like something for the cough. Please advise.

## 2016-09-13 NOTE — Telephone Encounter (Signed)
Tessalon sent. Would reassure her that color of sputum is not necessarily indicative of bacterial vs viral infection, I will not send antibiotics over the phone - If symptoms persist or if any concerns, she needs an appt.

## 2016-09-13 NOTE — Telephone Encounter (Signed)
Pt informed. Pt advised to schedule an appt if symptoms persist or if she has any concerns. Pt understood and was agreeable.

## 2016-09-24 ENCOUNTER — Ambulatory Visit (INDEPENDENT_AMBULATORY_CARE_PROVIDER_SITE_OTHER): Payer: Medicare Other

## 2016-09-24 ENCOUNTER — Ambulatory Visit (INDEPENDENT_AMBULATORY_CARE_PROVIDER_SITE_OTHER): Payer: Medicare Other | Admitting: Osteopathic Medicine

## 2016-09-24 ENCOUNTER — Encounter: Payer: Self-pay | Admitting: Osteopathic Medicine

## 2016-09-24 VITALS — BP 128/60 | HR 102 | Temp 97.7°F | Ht 62.0 in | Wt 203.0 lb

## 2016-09-24 DIAGNOSIS — G894 Chronic pain syndrome: Secondary | ICD-10-CM | POA: Diagnosis not present

## 2016-09-24 DIAGNOSIS — R059 Cough, unspecified: Secondary | ICD-10-CM

## 2016-09-24 DIAGNOSIS — M5126 Other intervertebral disc displacement, lumbar region: Secondary | ICD-10-CM

## 2016-09-24 DIAGNOSIS — R05 Cough: Secondary | ICD-10-CM | POA: Diagnosis not present

## 2016-09-24 MED ORDER — PREDNISONE 20 MG PO TABS: 20.0000 mg | ORAL_TABLET | Freq: Two times a day (BID) | ORAL | 0 refills | Status: DC

## 2016-09-24 MED ORDER — GUAIFENESIN 300 MG/15ML PO SOLN: 300.0000 mg | Freq: Three times a day (TID) | ORAL | 0 refills | Status: DC

## 2016-09-24 NOTE — Patient Instructions (Addendum)
Note: the following list assumes no pregnancy, normal liver & kidney function and no other drug interactions. Dr. Kynzlie Hilleary has highlighted medications which are safe for you to use, but these may not be appropriate for everyone. Always ask a pharmacist or qualified medical provider if there are any questions!    Aches/Pains, Fever Acetaminophen (Tylenol) 500 mg tablets - take max 2 tablets (1000 mg) every 6 hours (4 times per day). *Caution if kidney problems Ibuprofen (Motrin) 200 mg tablets - take max 4 tablets (800 mg) every 6 hours. *Caution if high blood pressure or liver problems  Sinus Congestion Prescription Ipratropium Nasal spray (Atrovent) 1 spray in each nostril 3-4 times per day Cromolyn Nasal Spray (NasalCrom) 1 spray each nostril 3-4 times per day, max 6 times per day Nasal Saline if desired to rinse nasal passages Oxymetolazone (Afrin, others) sparing use due to rebound congestion Phenylephrine (Sudafed) for congestion/sinus pressure, 10 mg tablets every 4 hours (or the 12-hour formulation). *Caution if high blood pressure Diphenhydramine (Benadryl) for runny nose, 25 mg tablets - take max 2 tablets every 4 hours. *Caution in the elderly population  Cough & Sore Throat  Prescription cough pills or syrups as directed by a medical professional   Dextromethorphan (Robitussin, others) - cough suppressant  Guaifenesin (Robitussin, Mucinex, others) - expectorant (helps cough up mucus, but you need to drink this with LOTS of water)  (Dextromethorphan and Guaifenesin also come in a combination tablet)  Lozenges w/ Benzocaine + Menthol (Cepacol)  Honey - as much as you want! Teaspoon at a time, can mix with hot water/tea or lemon juice  Teas which "coat the throat" - look for ingredients Elm Bark, Licorice Root, Marshmallow Root  Other Zinc Lozenges within 24 hours of symptoms onset - mixed evidence this shortens the duration of the common cold Don't waste your money on  Vitamin C or Echinacea Prescription Oseltamivir (Tamiflu) for influenza - note, this will NOT treat symptoms, it shortens course of flu by one day or so if started within 48 hours of symptoms, and it can prevent the flu in people who have been exposed to the flu if started within 48 hours of exposure. Most people will not benefit form this medicine, but we recommend it in vulnerable populations such as pediatric patients, elderly patients, pregnant pations or immune-compromised patients.      

## 2016-09-24 NOTE — Progress Notes (Signed)
HPI: Teresa Medina is a 71 y.o. female  who [facility], 09/[place]/16,  for chief complaint of:  Chief Complaint  Patient presents with  . Cough   Called about 12 days ago requesting prescription for cough. Since then, dry cough with occasional mucus production has persisted. No shortness of breath/wheezing. No sinus congestion or sore throat.    Past medical, surgical, social and family history reviewed: Patient Active Problem List   Diagnosis Date Noted  . Type 2 diabetes with nephropathy (HCC) 11/29/2015  . Chronic kidney disease (CKD) 11/29/2015  . Hx of bone density study 11/29/2015  . Postmenopausal 08/31/2015  . Chronic pain syndrome 06/23/2015  . Levoscoliosis 06/23/2015  . Retrolisthesis of vertebrae 06/23/2015  . Degenerative disc disease, lumbar 06/23/2015  . Herniation of intervertebral disc between L4 and L5 06/23/2015  . Type 2 diabetes mellitus (HCC) 06/02/2015  . Essential hypertension 06/02/2015  . Migraine 06/02/2015  . Seasonal allergies 06/02/2015  . History of vitamin D deficiency 06/02/2015  . Osteoporosis 06/02/2015  . GERD (gastroesophageal reflux disease) 06/02/2015   No past surgical history on file. Social History  Substance Use Topics  . Smoking status: Never Smoker  . Smokeless tobacco: Never Used  . Alcohol use No   Family History  Problem Relation Age of Onset  . Diabetes Sister   . Heart Problems Sister     multiple bypass surgeries  . Cancer Sister   . Depression Brother   . Heart Problems Brother     bad heart use oxygen     Current medication list and allergy/intolerance information reviewed:   Current Outpatient Prescriptions  Medication Sig Dispense Refill  . AMBULATORY NON FORMULARY MEDICATION Medication Name: Zostavax IM x 1 vial 1 Units 0  . benzonatate (TESSALON) 200 MG capsule Take 1 capsule (200 mg total) by mouth 3 (three) times daily as needed for cough. 30 capsule 0   . Calcium Carbonate-Vitamin D 600-400 MG-UNIT chew tablet Chew 2 tablets by mouth daily. 60 tablet 12  . Cholecalciferol (VITAMIN D-3) 1000 UNITS CAPS Take by mouth.    . cyclobenzaprine (FLEXERIL) 10 MG tablet TAKE ONE TABLET BY MOUTH THREE TIMES DAILY 90 tablet 0  . DULoxetine (CYMBALTA) 30 MG capsule Take 3 capsules (90 mg total) by mouth daily. 90 capsule 12  . fentaNYL (DURAGESIC - DOSED MCG/HR) 50 MCG/HR Place 1 patch (50 mcg total) onto the skin every 3 (three) days. 10 patch 0  . fluticasone (FLONASE) 50 MCG/ACT nasal spray Place into both nostrils.    Marland Kitchen. glucose blood test strip by Other route. Free Style Test Strips    . glyBURIDE (DIABETA) 2.5 MG tablet Take 1 tablet (2.5 mg total) by mouth daily with breakfast. 90 tablet 3  . losartan (COZAAR) 100 MG tablet Take 1 tablet (100 mg total) by mouth daily. 90 tablet 3  . metoprolol succinate (TOPROL-XL) 50 MG 24 hr tablet Take 1 tablet (50 mg total) by mouth daily. Take with or immediately following a meal. 90 tablet 1  . montelukast (SINGULAIR) 10 MG tablet Take 1 tablet (10 mg total) by mouth at bedtime. 90 tablet 3  . RABEprazole (ACIPHEX) 20 MG tablet TAKE ONE TABLET BY MOUTH DAILY 90 tablet 0  . SUMAtriptan (IMITREX) 100 MG tablet Take 1 tablet (100 mg) by mouth as needed for migraine. May repeat in 2 hours if headache persists or recurs. 30 tablet 3  . topiramate (TOPAMAX) 50 MG tablet TAKE  ONE TABLET BY MOUTH TWICE DAILY 180 tablet 1  . ZOSTAVAX 40981 UNT/0.65ML injection ADM 0.65ML [place] UTD  0   No current facility-administered medications for this visit.    No Known Allergies    Review of Systems:  Constitutional:  No  fever, no chills, +recent illness, No unintentional weight changes. No significant fatigue.   HEENT: No  headache, no vision change  Cardiac: No  chest pain, No  pressure, No palpitations, No  Orthopnea  Respiratory:  No  shortness of breath. +Cough  Gastrointestinal: No  abdominal pain, No  nausea, No   vomiting,  No  blood in stool, No  diarrhea, No  constipation   Exam:  BP 128/60   Pulse (!) 102   Temp 97.7 F (36.5 C) (Oral)   Ht 5\' 2"  (1.575 m)   Wt 203 lb (92.1 kg)   BMI 37.13 kg/m   Constitutional: VS see above. General Appearance: alert, well-developed, well-nourished, NAD  Eyes: Normal lids and conjunctive, non-icteric sclera  Ears, Nose, Mouth, Throat: MMM, Normal external inspection ears/nares/mouth/lips/gums. TM normal bilaterally. Pharynx/tonsils no erythema  Neck: No masses, trachea midline. No lymphadenopathy  Respiratory: Normal respiratory effort. no wheeze, no rhonchi, no rales  Cardiovascular: S1/S2 normal, no murmur, no rub/gallop auscultated. RRR.  Musculoskeletal: Gait normal. No clubbing/cyanosis of digits.   Neurological: Normal balance/coordination. No tremor.    Skin: warm, dry, intact. Fentanyl patch in place  Psychiatric: Normal judgment/insight. Normal mood and affect. Oriented x3.   Chest x-ray on personal review, questionable atelectasis versus pneumonia-type changes on right between upper and middle lobes, no lobar PNA, await radiology over-read as below  Dg Chest 2 View  Result Date: 09/24/2016 CLINICAL DATA:  Subacute onset of cough.  Initial encounter. EXAM: CHEST  2 VIEW COMPARISON:  None. FINDINGS: The lungs are well-aerated. Right midlung and left basilar linear opacities likely reflect atelectasis. There is no evidence of pleural effusion or pneumothorax. The heart is normal in size; the mediastinal contour is within normal limits. No acute osseous abnormalities are seen. IMPRESSION: Right midlung and left basilar linear opacities likely reflect atelectasis. Electronically Signed   By: Roanna Raider M.D.   On: 09/24/2016 17:17      ASSESSMENT/PLAN:   Recent negative UDS. Pt states she is taking the Fentanyl regularly. Will get confirmatory test today, patch is in place. I neglected to confirm presence of patch at last visit, but pt  has no history to suspect noncompliance or high risk, low risk score on opiate risk assessment tool.   No PNA on CXR, will advise incentive spirometry and consider repeat CXR pending progress   Cough - Plan: GuaiFENesin 300 MG/15ML SOLN, predniSONE (DELTASONE) 20 MG tablet, DG Chest 2 View  Herniation of intervertebral disc between L4 and L5 - Plan: Fentanyl Urine, w/Confirmation  Chronic pain syndrome - Plan: Fentanyl Urine, w/Confirmation        Visit summary with medication list and pertinent instructions was printed for patient to review. All questions at time of visit were answered - patient instructed to contact office with any additional concerns. ER/RTC precautions were reviewed with the patient. Follow-up plan: Return if symptoms worsen or fail to improve.

## 2016-09-25 ENCOUNTER — Other Ambulatory Visit: Payer: Self-pay | Admitting: Osteopathic Medicine

## 2016-09-25 ENCOUNTER — Ambulatory Visit (INDEPENDENT_AMBULATORY_CARE_PROVIDER_SITE_OTHER): Payer: Medicare Other

## 2016-09-25 ENCOUNTER — Encounter: Payer: Self-pay | Admitting: Osteopathic Medicine

## 2016-09-25 DIAGNOSIS — Z1231 Encounter for screening mammogram for malignant neoplasm of breast: Secondary | ICD-10-CM

## 2016-09-25 DIAGNOSIS — G894 Chronic pain syndrome: Secondary | ICD-10-CM

## 2016-09-25 DIAGNOSIS — M85851 Other specified disorders of bone density and structure, right thigh: Secondary | ICD-10-CM | POA: Diagnosis not present

## 2016-09-27 ENCOUNTER — Ambulatory Visit (INDEPENDENT_AMBULATORY_CARE_PROVIDER_SITE_OTHER): Payer: Medicare Other | Admitting: Osteopathic Medicine

## 2016-09-27 ENCOUNTER — Other Ambulatory Visit: Payer: Self-pay | Admitting: Osteopathic Medicine

## 2016-09-27 ENCOUNTER — Encounter: Payer: Self-pay | Admitting: Osteopathic Medicine

## 2016-09-27 DIAGNOSIS — G894 Chronic pain syndrome: Secondary | ICD-10-CM | POA: Diagnosis not present

## 2016-09-27 DIAGNOSIS — M5126 Other intervertebral disc displacement, lumbar region: Secondary | ICD-10-CM | POA: Diagnosis not present

## 2016-09-27 MED ORDER — FENTANYL 50 MCG/HR TD PT72: 50.0000 ug | MEDICATED_PATCH | TRANSDERMAL | 0 refills | Status: DC

## 2016-09-27 NOTE — Telephone Encounter (Signed)
Per PCP, appt was made today.

## 2016-09-27 NOTE — Progress Notes (Signed)
HPI: Teresa Medina is a 71 y.o. female  who presents to [facility] today, 09/27/16,  for chief complaint of:  Chief Complaint  Patient presents with  . Other    pain management    Chronic pain: Upon initiation of urine drug screen per clinic policy, initial test was negative for opiate metabolites. Discussed this further with the patient. Was unable to add on confirmatory labs for fentanyl. Saw patient 2 days ago for acute unrelated illness, asked her to repeat test with confirmation for [facility], confirmed presence of patch at that point. Patient misunderstood directions since we're also getting blood work she thought she was supposed to come back to the lab fasting so she came back to do that today. Had a long relationship with this patient, she has not given me any reason to suspect diversion. She has known musculoskeletal problems for which she takes fentanyl. Agrees to re-check levels today immediately after this visit, return for blood draw fasting at another time.  Fracture risk: Patient qualifies for osteoporosis treatment that she does not want to pursue any further medication interventions at this time despite fracture risk.  Past medical history, surgical history, social history and family history reviewed.  Patient Active Problem List   Diagnosis Date Noted  . Type 2 diabetes with nephropathy (HCC) 11/29/2015  . Chronic kidney disease (CKD) 11/29/2015  . Hx of bone density study 11/29/2015  . Postmenopausal 08/31/2015  . Chronic pain syndrome 06/23/2015  . Levoscoliosis 06/23/2015  . Retrolisthesis of vertebrae 06/23/2015  . Degenerative disc disease, lumbar 06/23/2015  . Herniation of intervertebral disc between L4 and L5 06/23/2015  . Type 2 diabetes mellitus (HCC) 06/02/2015  . Essential hypertension 06/02/2015  . Migraine 06/02/2015  . Seasonal allergies 06/02/2015  . History of vitamin D deficiency 06/02/2015  . Osteoporosis  06/02/2015  . GERD (gastroesophageal reflux disease) 06/02/2015    Current medication list and allergy/intolerance information reviewed.   Current Outpatient Prescriptions on File Prior to Visit  Medication Sig Dispense Refill  . AMBULATORY NON FORMULARY MEDICATION Medication Name: Zostavax IM x 1 vial 1 Units 0  . Calcium Carbonate-Vitamin D 600-400 MG-UNIT chew tablet Chew 2 tablets by mouth daily. 60 tablet 12  . Cholecalciferol (VITAMIN D-3) 1000 UNITS CAPS Take by mouth.    . cyclobenzaprine (FLEXERIL) 10 MG tablet TAKE ONE TABLET BY MOUTH THREE TIMES DAILY 90 tablet 0  . DULoxetine (CYMBALTA) 30 MG capsule TAKE THREE CAPSULES BY MOUTH DAILY 90 capsule 0  . fentaNYL (DURAGESIC - DOSED MCG/HR) 50 MCG/HR Place 1 patch (50 mcg total) onto the skin every 3 (three) days. 10 patch 0  . fluticasone (FLONASE) 50 MCG/ACT nasal spray Place into both nostrils.    Marland Kitchen glucose blood test strip by Other route. Free Style Test Strips    . glyBURIDE (DIABETA) 2.5 MG tablet Take 1 tablet (2.5 mg total) by mouth daily with breakfast. 90 tablet 3  . GuaiFENesin 300 MG/15ML SOLN Take 300 mg by mouth 3 (three) times daily. 236 mL 0  . losartan (COZAAR) 100 MG tablet Take 1 tablet (100 mg total) by mouth daily. 90 tablet 3  . metoprolol succinate (TOPROL-XL) 50 MG 24 hr tablet Take 1 tablet (50 mg total) by mouth daily. Take with or immediately following a meal. 90 tablet 1  . montelukast (SINGULAIR) 10 MG tablet Take 1 tablet (10 mg total) by mouth at bedtime. 90 tablet 3  . predniSONE (DELTASONE) 20 MG tablet Take  1 tablet (20 mg total) by mouth 2 (two) times daily with a meal. 10 tablet 0  . RABEprazole (ACIPHEX) 20 MG tablet TAKE ONE TABLET BY MOUTH DAILY 90 tablet 0  . SUMAtriptan (IMITREX) 100 MG tablet Take 1 tablet (100 mg) by mouth as needed for migraine. May repeat in 2 hours if headache persists or recurs. 30 tablet 3  . topiramate (TOPAMAX) 50 MG tablet TAKE ONE TABLET BY MOUTH TWICE DAILY 180 tablet  1  . ZOSTAVAX [id number] UNT/0.65ML injection ADM 0.65ML [place] UTD  0   No current facility-administered medications on file prior to visit.    No Known Allergies    Review of Systems:  Constitutional: +recent illness, feeling a bit better today   Cardiac: No  chest pain or palpitations  Exam:  BP (!) 146/88   Pulse 98   Ht 5' 1.5" (1.562 m)   Wt 203 lb (92.1 kg)   BMI 37.74 kg/m   Constitutional: VS see above. General Appearance: alert, well-developed, well-nourished, NAD  Psychiatric: Normal judgment/insight. Normal mood and affect. Oriented x3.      Dg Chest 2 View  Result Date: 09/24/2016 CLINICAL DATA:  Subacute onset of cough.  Initial encounter. EXAM: CHEST  2 VIEW COMPARISON:  None. FINDINGS: The lungs are well-aerated. Right midlung and left basilar linear opacities likely reflect atelectasis. There is no evidence of pleural effusion or pneumothorax. The heart is normal in size; the mediastinal contour is within normal limits. No acute osseous abnormalities are seen. IMPRESSION: Right midlung and left basilar linear opacities likely reflect atelectasis. Electronically Signed   By: Roanna RaiderJeffery  Chang M.D.   On: 09/24/2016 17:17   Dg Bone Density  Result Date: 09/25/2016 EXAM: DUAL X-RAY ABSORPTIOMETRY (DXA) FOR BONE MINERAL DENSITY IMPRESSION: Referring Physician:  Sunnie NielsenNATALIE Canesha Tesfaye PATIENT: Name: Teresa Medina, [name] Patient ID: [id number] Birth Date: 1945-09-22 Height: 61.0 in. Sex: Female Measured: 09/25/2016 Weight: 201.8 lbs. Indications: Advanced Age, Caucasian, Diabectic, Estrogen Deficiency, Family Hist. (Parent hip fracture), Postmenopausal Fractures: Treatments: Calcium, Glipizide, Vitamin D ASSESSMENT: The BMD measured at Femur Neck Right is 0.808 g/cm2 with a T-score of -1.7. This patient is considered OSTEOPENIC according to World Health Organization [facility](WHO) criteria. L- 2  was excluded due to degenerative changes. Site Region Measured Date Measured Age WHO YA BMD Classification  T-score AP Spine L1-L4 (L2) 09/25/2016 [age] Normal -0.5 1.124 g/cm2 DualFemur Neck Right 09/25/2016 [age] Osteopenia -1.7 0.808 g/cm2 World Health Organization [facility](WHO) criteria for post-menopausal, Caucasian Women: Normal       T-score at or above -1 SD Osteopenia   T-score between -1 and -2.5 SD Osteoporosis T-score at or below -2.5 SD RECOMMENDATION: National Osteoporosis Foundation recommends that FDA-approved medical therapies be considered in postmenopausal women and men age 72 or older with a: 1. Hip or vertebral (clinical or morphometric) fracture. 2. T-score of <-2.5 at the spine or hip. 3. Ten-year fracture probability by FRAX of 3% or greater for hip fracture or 20% or greater for major osteoporotic fracture. All treatments decisions require clinical judgment and consideration of individual patient factors, including patient preferences, co-morbidities, previous drug use, risk factors not captured in the FRAX model (e.g. falls, vitamin D deficiency, increased bone turnover, interval significant decline in bone density) and possible under - or over-estimation of fracture risk by FRAX. All patients should ensure an adequate intake of dietary calcium (1200 mg/d) and vitamin D (800 IU daily) unless contraindicated. FOLLOW-UP: People with diagnosed cases of osteoporosis or at high risk for fracture  should have regular bone mineral density tests. For patients eligible for Medicare, routine testing is allowed once every 2 years. The testing frequency can be increased to one year for patients who have rapidly progressing disease, those who are receiving or discontinuing medical therapy to restore bone mass, or have additional risk factors. I have reviewed this report and agree with the above findings. Mark A. Tyron Russell, M.D.  Radiology Patient: Teresa Herb   Referring Physician: Sunnie Nielsen Birth Date: 08/28/1945 [age] years Patient ID: [id number] Height: 61.0 in. Weight: 201.8 lbs.  Measured: 09/25/2016 2:22:20 PM (16 SP 2) Sex: Female Ethnicity: White Analyzed: 09/25/2016 2:41:46 PM (16 SP 2) FRAX* 10-year Probability of Fracture Based on femoral neck BMD: DualFemur (Right) Major Osteoporotic Fracture: 15.4% Hip Fracture:                4.6% Population:                  [place] (Caucasian) Risk Factors:                Family Hist. (Parent hip fracture) *FRAX is a Armed forces logistics/support/administrative officer of the [employer] for Metabolic Bone Disease, a World Science writer (WHO) [employer]. ASSESSMENT: The probability of a major osteoporotic fracture is 15.4% within the next ten years. The probability of a hip fracture is 4.6% within the next ten years. I have reviewed this report and agree with the above findings. Mark A. Tyron Russell, M.D. [facility] Electronically Signed   By: Ulyses Southward M.D.   On: 09/25/2016 16:59     ASSESSMENT/PLAN: Patient is advised that if sentinel is negative again on UDS, I will first further investigated with the lab to see how this could happen if she has been consistently taking the patches and their presence has been confirmed at office visit. If we are unable to come to his satisfactory conclusion, we'll need to assume diversion of medication or ineffective medication and investigate further, consider referral to pain management  Chronic pain syndrome - Plan: fentaNYL (DURAGESIC - DOSED MCG/HR) 50 MCG/HR   Follow-up plan: Return in about 3 months (around 12/25/2016) for pain management.  All questions at time of visit were answered - patient instructed to contact office with any additional concerns. ER/RTC precautions were reviewed with the patient and understanding verbalized.   Note: Total time spent 15 minutes, greater than 50% of the visit was spent face-to-face counseling and coordinating care for the following: The encounter diagnosis was Chronic pain syndrome.Marland Kitchen

## 2016-09-30 ENCOUNTER — Encounter: Payer: Self-pay | Admitting: Osteopathic Medicine

## 2016-10-02 ENCOUNTER — Ambulatory Visit: Payer: Medicare Other | Admitting: Neurology

## 2016-10-03 LAB — PAIN MGMT, FENTANYL QN, U
Fentanyl: 61.2 ng/mL — ABNORMAL HIGH (ref <0.5)
Norfentanyl: >400 ng/mL — ABNORMAL HIGH (ref <0.5)

## 2016-10-15 ENCOUNTER — Other Ambulatory Visit: Payer: Self-pay | Admitting: Osteopathic Medicine

## 2016-10-16 ENCOUNTER — Ambulatory Visit: Payer: Medicare Other | Admitting: Neurology

## 2016-10-24 ENCOUNTER — Other Ambulatory Visit: Payer: Self-pay | Admitting: Osteopathic Medicine

## 2016-10-24 DIAGNOSIS — G894 Chronic pain syndrome: Secondary | ICD-10-CM

## 2016-10-31 ENCOUNTER — Other Ambulatory Visit: Payer: Self-pay | Admitting: Osteopathic Medicine

## 2016-10-31 DIAGNOSIS — G894 Chronic pain syndrome: Secondary | ICD-10-CM

## 2016-11-01 ENCOUNTER — Ambulatory Visit (INDEPENDENT_AMBULATORY_CARE_PROVIDER_SITE_OTHER): Payer: Medicare Other | Admitting: Neurology

## 2016-11-01 ENCOUNTER — Encounter: Payer: Self-pay | Admitting: Neurology

## 2016-11-01 VITALS — BP 139/80 | HR 85 | Ht 61.0 in | Wt 203.2 lb

## 2016-11-01 DIAGNOSIS — G43709 Chronic migraine without aura, not intractable, without status migrainosus: Secondary | ICD-10-CM | POA: Diagnosis not present

## 2016-11-01 MED ORDER — TOPIRAMATE 50 MG PO TABS: 150.0000 mg | ORAL_TABLET | Freq: Every day | ORAL | 3 refills | Status: DC

## 2016-11-01 MED ORDER — FENTANYL 50 MCG/HR TD PT72: 50.0000 ug | MEDICATED_PATCH | TRANSDERMAL | 0 refills | Status: DC

## 2016-11-01 NOTE — Telephone Encounter (Signed)
Patient request refill for Fentanyl. Rx has been placed in Dr. Lyn HollingsheadAlexander for a signature. Rhonda Cunningham,CMA

## 2016-11-01 NOTE — Progress Notes (Signed)
[facility]    Provider:  Dr Lucia GaskinsAhern Referring Provider: Sunnie NielsenAlexander, Natalie, DO Primary Care Physician:  Sunnie NielsenNatalie Alexander, DO  CC:  Migraines  HPI:  Teresa Medina is a 71 y.o. female here as a referral from Dr. Lyn HollingsheadAlexander for migraines. Past medical history type 2 diabetes, chronic kidney disease, chronic pain syndrome on fentanyl patch and opioids, degenerative disc disease, hypertension, migraine.She is on Topamax 50mg  twice daily. Migraines started 66 years ago. Both parents had migraines. She has 2-3 migraines a week, over 15 migraines a month. They are on the right side at the temple. Pounding, throbbing, nausea and light sensitivity. No aura. She wakes up with migraines as well. She has been tested for sleep apnea and it was negative. They can strike at any time. Unknown triggers. No vision changes. They can last 6-24 hours. She takes imitrex which helps. They can be severe and has lasted for 5 days in a row. She takes Topiramate which helped in the past but now they are getting worse. No aura, no medication overuse.  No other associated symptoms, inciting events or modifiable factors.  Meds tried: Topamax, metoprolol, cymbalta, propranolol, amitriptyline, flexeril  Reviewed notes, labs and imaging from outside physicians, which showed:  Reviewed labs CBC and CMP normal.  Review of Systems: Patient complains of symptoms per HPI as well as the following symptoms:memory loss, headache, numbness, weakness, snoring, restless legs, depression. Pertinent negatives per HPI. All others negative.   Social History   Social History  . Marital status: Widowed    Spouse name: N/A  . Number of children: 2  . Years of education: 12   Occupational History  . N/A    Social History Main Topics  . Smoking status: Never Smoker  . Smokeless tobacco: Never Used  . Alcohol use No  . Drug use: No  . Sexual activity: Not on file   Other Topics Concern  . Not on file    Social History Narrative   Lives at home w/ her son and his wife   Right-handed   Caffeine: 1 cup of coffee per day    Family History  Problem Relation Age of Onset  . Diabetes Sister   . Heart Problems Sister     multiple bypass surgeries  . Cancer Sister   . Depression Brother   . Heart Problems Brother     bad heart use oxygen  . Coronary artery disease Mother   . Migraines Mother   . Pulmonary fibrosis Father   . Pneumonia Father   . Migraines Father   . Breast cancer Maternal Aunt     Past Medical History:  Diagnosis Date  . Arthritis   . Chronic pain syndrome 06/23/2015   Records reviewed: pt has been on Fentanyl TD, Cymbalta, Flexeril  . Degenerative disc disease, lumbar 06/23/2015   MRI 06/2014 most severe L4-5  . Depression   . Diabetes mellitus without complication (HCC)   . Essential hypertension 06/02/2015  . GERD (gastroesophageal reflux disease) 06/02/2015  . Herniation of intervertebral disc between L4 and L5 06/23/2015   MRI 06/2014 moderate to severe R foraminal narrowing  . History of vitamin D deficiency 06/02/2015  . Hypertension   . Levoscoliosis 06/23/2015   Mild, MRI 06/22/14  . Migraine 06/02/2015  . Osteoporosis 06/02/2015  . Retrolisthesis of vertebrae 06/23/2015   Gr1 L1 on L2 MRI 06/2014  . Seasonal allergies 06/02/2015  . Type 2 diabetes mellitus (HCC) 06/02/2015    Past Surgical History:  Procedure Laterality Date  . BUNIONECTOMY Left 1993  . FOOT SURGERY  2008  . FOOT SURGERY  2003  . NECK SURGERY  1993  . REPLACEMENT TOTAL KNEE Bilateral 1999   R in Jan, L in Apr  . TONSILLECTOMY  1952    Current Outpatient Prescriptions  Medication Sig Dispense Refill  . AMBULATORY NON FORMULARY MEDICATION Medication Name: Zostavax IM x 1 vial 1 Units 0  . Calcium Carbonate-Vitamin D 600-400 MG-UNIT chew tablet Chew 2 tablets by mouth daily. 60 tablet 12  . Cholecalciferol (VITAMIN D-3) 1000 UNITS CAPS Take by mouth.    .  cyclobenzaprine (FLEXERIL) 10 MG tablet TAKE ONE TABLET BY MOUTH THREE TIMES DAILY 90 tablet 0  . DULoxetine (CYMBALTA) 30 MG capsule TAKE THREE CAPSULES BY MOUTH DAILY 90 capsule 0  . fentaNYL (DURAGESIC - DOSED MCG/HR) 50 MCG/HR Place 1 patch (50 mcg total) onto the skin every 3 (three) days. 10 patch 0  . fluticasone (FLONASE) 50 MCG/ACT nasal spray Place into both nostrils.    Marland Kitchen glucose blood test strip by Other route. Free Style Test Strips    . glyBURIDE (DIABETA) 2.5 MG tablet Take 1 tablet (2.5 mg total) by mouth daily with breakfast. 90 tablet 3  . losartan (COZAAR) 100 MG tablet Take 1 tablet (100 mg total) by mouth daily. 90 tablet 3  . metoprolol succinate (TOPROL-XL) 50 MG 24 hr tablet Take 1 tablet (50 mg total) by mouth daily. Take with or immediately following a meal. 90 tablet 1  . montelukast (SINGULAIR) 10 MG tablet Take 1 tablet (10 mg total) by mouth at bedtime. 90 tablet 3  . RABEprazole (ACIPHEX) 20 MG tablet TAKE ONE TABLET BY MOUTH DAILY 90 tablet 0  . SUMAtriptan (IMITREX) 100 MG tablet Take 1 tablet (100 mg) by mouth as needed for migraine. May repeat in 2 hours if headache persists or recurs. 30 tablet 3  . topiramate (TOPAMAX) 50 MG tablet Take 3 tablets (150 mg total) by mouth at bedtime. 270 tablet 3   No current facility-administered medications for this visit.     Allergies as of 11/01/2016  . (No Known Allergies)    Vitals: BP 139/80   Pulse 85   Ht 5\' 1"  (1.549 m)   Wt 203 lb 3.2 oz (92.2 kg)   BMI 38.39 kg/m  Last Weight:  Wt Readings from Last 1 Encounters:  11/01/16 203 lb 3.2 oz (92.2 kg)   Last Height:   Ht Readings from Last 1 Encounters:  11/01/16 5\' 1"  (1.549 m)    Physical exam: Exam: Gen: NAD, very conversant, obese               CV: RRR, no MRG. No Carotid Bruits. No peripheral edema, warm, nontender Eyes: Conjunctivae clear without exudates or hemorrhage  Neuro: Detailed Neurologic Exam  Speech:    Speech is normal; fluent  and spontaneous with normal comprehension.  Cognition:    The patient is oriented to person, place, and time;     recent and remote memory intact;     language fluent;     normal attention, concentration,     fund of knowledge Cranial Nerves:    The pupils are equal, round, and reactive to light. Attempted fundoscopic exam could not visualize. Visual fields are full to finger confrontation. Extraocular movements are intact. Trigeminal sensation is intact and the muscles of mastication are normal. The face is symmetric. The palate elevates in the midline. Hearing intact. Voice  is normal. Shoulder shrug is normal. The tongue has normal motion without fasciculations.   Coordination:    No dysmetria  Gait: Walks with eversion of feet, narrow gait      Motor Observation:    no involuntary movements noted. Tone:    Normal muscle tone.    Posture:    Posture is stooped    Strength:    Mild proximal leg weakness. Otherwise strength is intact in the upper and lower limbs.      Sensation: intact to LT     Reflex Exam:  DTR's:    Deep tendon reflexes in the upper and lower extremities are brisk bilaterally.   Toes:    The toes are equivocal bilaterally.   Clonus:    Clonus is absent.      Assessment/Plan:  71 year old with 50+ years of migraines  Increase Topiramate to 150mg  qhs Can try Depakote if increasing the Topiramate does not work Discussed Botox for migraines, she will consider  Cc: Dr. Lyn Hollingshead  Discussed: To prevent or relieve headaches, try the following: Cool Compress. Lie down and place a cool compress on your head.  Avoid headache triggers. If certain foods or odors seem to have triggered your migraines in the past, avoid them. A headache diary might help you identify triggers.  Include physical activity in your daily routine. Try a daily walk or other moderate aerobic exercise.  Manage stress. Find healthy ways to cope with the stressors, such as delegating  tasks on your to-do list.  Practice relaxation techniques. Try deep breathing, yoga, massage and visualization.  Eat regularly. Eating regularly scheduled meals and maintaining a healthy diet might help prevent headaches. Also, drink plenty of fluids.  Follow a regular sleep schedule. Sleep deprivation might contribute to headaches Consider biofeedback. With this mind-body technique, you learn to control certain bodily functions - such as muscle tension, heart rate and blood pressure - to prevent headaches or reduce headache pain.    Proceed to emergency room if you experience new or worsening symptoms or symptoms do not resolve, if you have new neurologic symptoms or if headache is severe, or for any concerning symptom.    Teresa Dean, MD  [facility] [address] [place], [place] [address]  Phone [phone] Fax [phone]  A total of XX minutes was spent in with this patient face-to-face. Over half this time was spent on counseling patient on the chronic migraine diagnosis and different therapeutic options available.

## 2016-11-01 NOTE — Patient Instructions (Signed)
Remember to drink plenty of fluid, eat healthy meals and do not skip any meals. Try to eat protein with a every meal and eat a healthy snack such as fruit or nuts in between meals. Try to keep a regular sleep-wake schedule and try to exercise daily, particularly in the form of walking, 20-30 minutes a day, if you can.   As far as your medications are concerned, I would like to suggest: Increase Topiramate to 150mg  (3 pills) all at night  I would like to see you back in 3 months, sooner if we need to. Please call us with any interim questions, concerns, problems, updates or refill requests.    Our phone number is [phone]. We also have an after hours call service for urgent matters and there is a physician on-call for urgent questions. For any emergencies you know to call 911 or go to the nearest emergency room  Topiramate tablets What is this medicine? TOPIRAMATE (toe PYRE a mate) is used to treat seizures in adults or children with epilepsy. It is also used for the prevention of migraine headaches. This medicine may be used for other purposes; ask your health care provider or pharmacist if you have questions. COMMON BRAND NAME(S): Topamax, Topiragen What should I tell my health care provider before I take this medicine? They need to know if you have any of these conditions: -bleeding disorders -cirrhosis of the liver or liver disease -diarrhea -glaucoma -kidney stones or kidney disease -low blood counts, like low white cell, platelet, or red cell counts -lung disease like asthma, obstructive pulmonary disease, emphysema -metabolic acidosis -on a ketogenic diet -schedule for surgery or a procedure -suicidal thoughts, plans, or attempt; a previous suicide attempt by you or a family member -an unusual or allergic reaction to topiramate, other medicines, foods, dyes, or preservatives -pregnant or trying to get pregnant -breast-feeding How should I use this medicine? Take this medicine  by mouth with a glass of water. Follow the directions on the prescription label. Do not crush or chew. You may take this medicine with meals. Take your medicine at regular intervals. Do not take it more often than directed. Talk to your pediatrician regarding the use of this medicine in children. Special care may be needed. While this drug may be prescribed for children as young as 572 years of age for selected conditions, precautions do apply. Overdosage: If you think you have taken too much of this medicine contact a poison control center or emergency room at once. NOTE: This medicine is only for you. Do not share this medicine with others. What if I miss a dose? If you miss a dose, take it as soon as you can. If your next dose is to be taken in less than 6 hours, then do not take the missed dose. Take the next dose at your regular time. Do not take double or extra doses. What may interact with this medicine? Do not take this medicine with any of the following medications: -probenecid This medicine may also interact with the following medications: -acetazolamide -alcohol -amitriptyline -aspirin and aspirin-like medicines -birth control pills -certain medicines for depression -certain medicines for seizures -certain medicines that treat or prevent blood clots like warfarin, enoxaparin, dalteparin, apixaban, dabigatran, and rivaroxaban -digoxin -hydrochlorothiazide -lithium -medicines for pain, sleep, or muscle relaxation -metformin -methazolamide -NSAIDS, medicines for pain and inflammation, like ibuprofen or naproxen -pioglitazone -risperidone This list may not describe all possible interactions. Give your health care provider a list of all the  medicines, herbs, non-prescription drugs, or dietary supplements you use. Also tell them if you smoke, drink alcohol, or use illegal drugs. Some items may interact with your medicine. What should I watch for while using this medicine? Visit your  doctor or health care professional for regular checks on your progress. Do not stop taking this medicine suddenly. This increases the risk of seizures if you are using this medicine to control epilepsy. Wear a medical identification bracelet or chain to say you have epilepsy or seizures, and carry a card that lists all your medicines. This medicine can decrease sweating and increase your body temperature. Watch for signs of deceased sweating or fever, especially in children. Avoid extreme heat, hot baths, and saunas. Be careful about exercising, especially in hot weather. Contact your health care provider right away if you notice a fever or decrease in sweating. You should drink plenty of fluids while taking this medicine. If you have had kidney stones in the past, this will help to reduce your chances of forming kidney stones. If you have stomach pain, with nausea or vomiting and yellowing of your eyes or skin, call your doctor immediately. You may get drowsy, dizzy, or have blurred vision. Do not drive, use machinery, or do anything that needs mental alertness until you know how this medicine affects you. To reduce dizziness, do not sit or stand up quickly, especially if you are an older patient. Alcohol can increase drowsiness and dizziness. Avoid alcoholic drinks. If you notice blurred vision, eye pain, or other eye problems, seek medical attention at once for an eye exam. The use of this medicine may increase the chance of suicidal thoughts or actions. Pay special attention to how you are responding while on this medicine. Any worsening of mood, or thoughts of suicide or dying should be reported to your health care professional right away. This medicine may increase the chance of developing metabolic acidosis. If left untreated, this can cause kidney stones, bone disease, or slowed growth in children. Symptoms include breathing fast, fatigue, loss of appetite, irregular heartbeat, or loss of  consciousness. Call your doctor immediately if you experience any of these side effects. Also, tell your doctor about any surgery you plan on having while taking this medicine since this may increase your risk for metabolic acidosis. Birth control pills may not work properly while you are taking this medicine. Talk to your doctor about using an extra method of birth control. Women who become pregnant while using this medicine may enroll in the [place] Antiepileptic Drug Pregnancy Registry by calling [id number]. This registry collects information about the safety of antiepileptic drug use during pregnancy. What side effects may I notice from receiving this medicine? Side effects that you should report to your doctor or health care professional as soon as possible: -allergic reactions like skin rash, itching or hives, swelling of the face, lips, or tongue -decreased sweating and/or rise in body temperature -depression -difficulty breathing, fast or irregular breathing patterns -difficulty speaking -difficulty walking or controlling muscle movements -hearing impairment -redness, blistering, peeling or loosening of the skin, including inside the mouth -tingling, pain or numbness in the hands or feet -unusual bleeding or bruising -unusually weak or tired -worsening of mood, thoughts or actions of suicide or dying Side effects that usually do not require medical attention (report to your doctor or health care professional if they continue or are bothersome): -altered taste -back pain, joint or muscle aches and pains -diarrhea, or constipation -headache -loss of appetite -  nausea -stomach upset, indigestion -tremors This list may not describe all possible side effects. Call your doctor for medical advice about side effects. You may report side effects to FDA at 1-800-FDA-1088. Where should I keep my medicine? Keep out of the reach of children. Store at room temperature between 15 and  30 degrees C (59 and 86 degrees F) in a tightly closed container. Protect from moisture. Throw away any unused medicine after the expiration date. NOTE: This sheet is a summary. It may not cover all possible information. If you have questions about this medicine, talk to your doctor, pharmacist, or health care provider.  2018 Elsevier/Gold Standard (2013-08-03 23:17:57)

## 2016-11-29 ENCOUNTER — Telehealth: Payer: Self-pay

## 2016-11-29 ENCOUNTER — Other Ambulatory Visit: Payer: Self-pay | Admitting: Osteopathic Medicine

## 2016-11-29 DIAGNOSIS — G894 Chronic pain syndrome: Secondary | ICD-10-CM

## 2016-11-29 DIAGNOSIS — Z8639 Personal history of other endocrine, nutritional and metabolic disease: Secondary | ICD-10-CM

## 2016-11-29 MED ORDER — DULOXETINE HCL 30 MG PO CPEP: 90.0000 mg | ORAL_CAPSULE | Freq: Every day | ORAL | 0 refills | Status: DC

## 2016-11-29 NOTE — Telephone Encounter (Signed)
Patient request a 90 day supply for Cymbalta 30 mg. It was sent to [facility]. Rhonda Cunningham,CMA

## 2016-11-30 MED ORDER — FENTANYL 50 MCG/HR TD PT72: 50.0000 ug | MEDICATED_PATCH | TRANSDERMAL | 0 refills | Status: DC

## 2016-12-04 ENCOUNTER — Telehealth: Payer: Self-pay | Admitting: Osteopathic Medicine

## 2016-12-04 DIAGNOSIS — G894 Chronic pain syndrome: Secondary | ICD-10-CM

## 2016-12-04 MED ORDER — FENTANYL 50 MCG/HR TD PT72: 50.0000 ug | MEDICATED_PATCH | TRANSDERMAL | 0 refills | Status: DC

## 2016-12-04 NOTE — Telephone Encounter (Signed)
Please call patient: I have left prescription for fentanyl up at front desk.   Clinical note: I did not leave the 2 Rx for 12/2016 and 01/2017 - pt has appt for refills maintenance in 12/2016, extra Rx were shredded

## 2016-12-04 NOTE — Telephone Encounter (Signed)
PATIENT HAS BEEN INFORMED. Teresa Medina,CMA  

## 2016-12-06 ENCOUNTER — Other Ambulatory Visit: Payer: Self-pay | Admitting: Osteopathic Medicine

## 2017-01-03 ENCOUNTER — Ambulatory Visit (INDEPENDENT_AMBULATORY_CARE_PROVIDER_SITE_OTHER): Payer: Medicare Other | Admitting: Osteopathic Medicine

## 2017-01-03 ENCOUNTER — Encounter: Payer: Self-pay | Admitting: Osteopathic Medicine

## 2017-01-03 ENCOUNTER — Ambulatory Visit (INDEPENDENT_AMBULATORY_CARE_PROVIDER_SITE_OTHER): Payer: Medicare Other

## 2017-01-03 VITALS — BP 136/80 | HR 82 | Ht 61.0 in | Wt 203.0 lb

## 2017-01-03 DIAGNOSIS — M25561 Pain in right knee: Secondary | ICD-10-CM

## 2017-01-03 DIAGNOSIS — G8929 Other chronic pain: Secondary | ICD-10-CM | POA: Diagnosis not present

## 2017-01-03 DIAGNOSIS — Z96651 Presence of right artificial knee joint: Secondary | ICD-10-CM | POA: Diagnosis not present

## 2017-01-03 DIAGNOSIS — G894 Chronic pain syndrome: Secondary | ICD-10-CM | POA: Diagnosis not present

## 2017-01-03 DIAGNOSIS — M25571 Pain in right ankle and joints of right foot: Secondary | ICD-10-CM | POA: Diagnosis not present

## 2017-01-03 DIAGNOSIS — S99911A Unspecified injury of right ankle, initial encounter: Secondary | ICD-10-CM | POA: Diagnosis not present

## 2017-01-03 DIAGNOSIS — W19XXXA Unspecified fall, initial encounter: Secondary | ICD-10-CM

## 2017-01-03 DIAGNOSIS — M25562 Pain in left knee: Secondary | ICD-10-CM

## 2017-01-03 DIAGNOSIS — I10 Essential (primary) hypertension: Secondary | ICD-10-CM | POA: Diagnosis not present

## 2017-01-03 DIAGNOSIS — M8580 Other specified disorders of bone density and structure, unspecified site: Secondary | ICD-10-CM | POA: Diagnosis not present

## 2017-01-03 DIAGNOSIS — Z471 Aftercare following joint replacement surgery: Secondary | ICD-10-CM | POA: Diagnosis not present

## 2017-01-03 DIAGNOSIS — G43801 Other migraine, not intractable, with status migrainosus: Secondary | ICD-10-CM

## 2017-01-03 DIAGNOSIS — E119 Type 2 diabetes mellitus without complications: Secondary | ICD-10-CM | POA: Diagnosis not present

## 2017-01-03 DIAGNOSIS — E1121 Type 2 diabetes mellitus with diabetic nephropathy: Secondary | ICD-10-CM | POA: Diagnosis not present

## 2017-01-03 DIAGNOSIS — Z96652 Presence of left artificial knee joint: Secondary | ICD-10-CM | POA: Diagnosis not present

## 2017-01-03 MED ORDER — FENTANYL 50 MCG/HR TD PT72: 50.0000 ug | MEDICATED_PATCH | TRANSDERMAL | 0 refills | Status: DC

## 2017-01-03 NOTE — Progress Notes (Signed)
HPI: Teresa Medina is a 71 y.o. female  who presents to [facility] today, 01/03/17,  for chief complaint of:  Chief Complaint  Patient presents with  . Follow-up    CHRONIC PAIN     Chronic pain: Patient is on fentanyl patches 12/04/16 last filled at pharmacy per database review. Other prescriptions in the system initially written by mistake and were shredded, see telephone encounter.   Recent fall: Mechanical fall, did not lift leg up high enough when walking upstairs, foot cooked on step and she fell forward onto knees, thinks her right ankle may have been torqued a bit as well. Of note, has declined osteoporosis treatment in the past. Fall happened about 2 weeks ago, will be 3 weeks ago in a few days. No loss of consciousness.  Diabetes: Due for A1c recheck, will get this with labs. Patient has not yet gotten fasting labs done which were ordered some time ago.  Migraine: Patient states headache has started since she has been in the office, dimming the lights helps. History of migraines, following with neurology. Declines migraine cocktail here, would rather wait until she gets home and take sumatriptan and  Patient is accompanied by son who assists with history-taking.   Past medical history, surgical history, social history and family history reviewed.  Patient Active Problem List   Diagnosis Date Noted  . Type 2 diabetes with nephropathy (HCC) 11/29/2015  . Chronic kidney disease (CKD) 11/29/2015  . Hx of bone density study 11/29/2015  . Postmenopausal 08/31/2015  . Chronic pain syndrome 06/23/2015  . Levoscoliosis 06/23/2015  . Retrolisthesis of vertebrae 06/23/2015  . Degenerative disc disease, lumbar 06/23/2015  . Herniation of intervertebral disc between L4 and L5 06/23/2015  . Type 2 diabetes mellitus (HCC) 06/02/2015  . Essential hypertension 06/02/2015  . Migraine 06/02/2015  . Seasonal allergies 06/02/2015  . History of vitamin  D deficiency 06/02/2015  . Osteoporosis 06/02/2015  . GERD (gastroesophageal reflux disease) 06/02/2015    Current medication list and allergy/intolerance information reviewed.   Current Outpatient Prescriptions on File Prior to Visit  Medication Sig Dispense Refill  . AMBULATORY NON FORMULARY MEDICATION Medication Name: Zostavax IM x 1 vial 1 Units 0  . Calcium Carbonate-Vitamin D 600-400 MG-UNIT chew tablet Chew 2 tablets by mouth daily. 60 tablet 12  . Cholecalciferol (VITAMIN D-3) 1000 UNITS CAPS Take by mouth.    . cyclobenzaprine (FLEXERIL) 10 MG tablet TAKE ONE TABLET BY MOUTH THREE TIMES DAILY 90 tablet 0  . DULoxetine (CYMBALTA) 30 MG capsule Take 3 capsules (90 mg total) by mouth daily. 270 capsule 0  . [START ON 01/29/2017] fentaNYL (DURAGESIC - DOSED MCG/HR) 50 MCG/HR Place 1 patch (50 mcg total) onto the skin every 3 (three) days. 10 patch 0  . fluticasone (FLONASE) 50 MCG/ACT nasal spray Place into both nostrils.    Marland Kitchen glucose blood test strip by Other route. Free Style Test Strips    . glyBURIDE (DIABETA) 2.5 MG tablet Take 1 tablet (2.5 mg total) by mouth daily with breakfast. 90 tablet 3  . losartan (COZAAR) 100 MG tablet Take 1 tablet (100 mg total) by mouth daily. 90 tablet 3  . metoprolol succinate (TOPROL-XL) 50 MG 24 hr tablet Take 1 tablet (50 mg total) by mouth daily. Take with or immediately following a meal. 90 tablet 1  . montelukast (SINGULAIR) 10 MG tablet Take 1 tablet (10 mg total) by mouth at bedtime. 90 tablet 3  . RABEprazole (ACIPHEX)  20 MG tablet TAKE ONE TABLET BY MOUTH DAILY 90 tablet 0  . SUMAtriptan (IMITREX) 100 MG tablet TAKE ONE TABLET BY MOUTH AS NEEDED FOR MIGRAINE. MAY REPEAT IN TWO HOURS IF HEADACHE PERSISTS OR RECURS. 30 tablet 0  . topiramate (TOPAMAX) 50 MG tablet Take 3 tablets (150 mg total) by mouth at bedtime. 270 tablet 3   No current facility-administered medications on file prior to visit.    No Known Allergies    Review of  Systems:  Constitutional: No recent illness  HEENT: No  headache, no vision change  Cardiac: No  chest pain, No  pressure, No palpitations  Respiratory:  No  shortness of breath. No  Cough  Gastrointestinal: No  abdominal pain, no change on bowel habits  Musculoskeletal: +new myalgia/arthralgia  Skin: No  Rash  Hem/Onc: No  easy bruising/bleeding, No  abnormal lumps/bumps  Neurologic: No  weakness, No  Dizziness  Psychiatric: No  concerns with depression, No  concerns with anxiety  Exam:  BP 136/80   Pulse 82   Ht 5\' 1"  (1.549 m)   Wt 203 lb (92.1 kg)   BMI 38.36 kg/m   Constitutional: VS see above. General Appearance: alert, well-developed, well-nourished, NAD  Eyes: Normal lids and conjunctive, non-icteric sclera  Ears, Nose, Mouth, Throat: MMM, Normal external inspection ears/nares/mouth/lips/gums.  Neck: No masses, trachea midline.   Respiratory: Normal respiratory effort. no wheeze, no rhonchi, no rales  Cardiovascular: S1/S2 normal, no murmur, no rub/gallop auscultated. RRR.   Musculoskeletal: Gait normal. Symmetric and independent movement of all extremities. No instability to knee ligaments. Right ankle resolving ecchymoses, no effusion/edema, postsurgical changes causing some foot eversion  Neurological: Normal balance/coordination. No tremor.  Skin: warm, dry, intact.   Psychiatric: Normal judgment/insight. Normal mood and affect. Oriented x3.   No results found.   ASSESSMENT/PLAN:   Fall, initial encounter - Plan: DG Ankle Complete Right, DG Knee 4 Views W/Patella Right, DG Knee 4 Views W/Patella Left, Ambulatory referral to Physical Therapy  Chronic pain syndrome - 3 months of fentanyl patch prescriptions provided - Plan: fentaNYL (DURAGESIC - DOSED MCG/HR) 50 MCG/HR, Ambulatory referral to Physical Therapy, DISCONTINUED: fentaNYL (DURAGESIC - DOSED MCG/HR) 50 MCG/HR, DISCONTINUED: fentaNYL (DURAGESIC - DOSED MCG/HR) 50 MCG/HR, DISCONTINUED:  fentaNYL (DURAGESIC - DOSED MCG/HR) 50 MCG/HR  Essential hypertension  Other migraine with status migrainosus, not intractable  Type 2 diabetes mellitus without complication, without long-term current use of insulin (HCC) - Plan: Hemoglobin A1c  Chronic pain of both knees - Exacerbated by recent fall, we'll get x-rays confirm hardware in place - Plan: DG Knee 4 Views W/Patella Right, DG Knee 4 Views W/Patella Left, Ambulatory referral to Physical Therapy  Right ankle pain, unspecified chronicity - We'll get x-rays confirm hardware in place, consider orthopedic referral - Plan: DG Ankle Complete Right, Ambulatory referral to Physical Therapy      Follow-up plan: Return in about 3 months (around 04/05/2017) for REFILL MEDICATIONS, sooner if needed.  Visit summary with medication list and pertinent instructions was printed for patient to review, alert us if any changes needed. All questions at time of visit were answered - patient instructed to contact office with any additional concerns. ER/RTC precautions were reviewed with the patient and understanding verbalized.

## 2017-01-04 LAB — CBC WITH DIFFERENTIAL/PLATELET
BASOS ABS: 0 {cells}/uL (ref 0–200)
BASOS PCT: 0 %
EOS ABS: 288 {cells}/uL (ref 15–500)
EOS PCT: 3 %
HCT: 45.3 % — ABNORMAL HIGH (ref 35.0–45.0)
HEMOGLOBIN: 14.5 g/dL (ref 11.7–15.5)
LYMPHS ABS: 2688 {cells}/uL (ref 850–3900)
Lymphocytes Relative: 28 %
MCH: 27.6 pg (ref 27.0–33.0)
MCHC: 32 g/dL (ref 32.0–36.0)
MCV: 86.1 fL (ref 80.0–100.0)
MPV: 10.6 fL (ref 7.5–12.5)
Monocytes Absolute: 672 cells/uL (ref 200–950)
Monocytes Relative: 7 %
NEUTROS ABS: 5952 {cells}/uL (ref 1500–7800)
Neutrophils Relative %: 62 %
PLATELETS: 207 10*3/uL (ref 140–400)
RBC: 5.26 MIL/uL — ABNORMAL HIGH (ref 3.80–5.10)
RDW: 14.7 % (ref 11.0–15.0)
WBC: 9.6 10*3/uL (ref 3.8–10.8)

## 2017-01-04 LAB — LIPID PANEL
Cholesterol: 193 mg/dL (ref <200)
HDL: 52 mg/dL (ref >50)
LDL CALC: 114 mg/dL — AB (ref <100)
Total CHOL/HDL Ratio: 3.7 Ratio (ref <5.0)
Triglycerides: 137 mg/dL (ref <150)
VLDL: 27 mg/dL (ref <30)

## 2017-01-04 LAB — VITAMIN D 25 HYDROXY (VIT D DEFICIENCY, FRACTURES): VIT D 25 HYDROXY: 41 ng/mL (ref 30–100)

## 2017-01-04 LAB — COMPLETE METABOLIC PANEL WITH GFR
ALBUMIN: 3.9 g/dL (ref 3.6–5.1)
ALK PHOS: 97 U/L (ref 33–130)
ALT: 7 U/L (ref 6–29)
AST: 12 U/L (ref 10–35)
BILIRUBIN TOTAL: 0.6 mg/dL (ref 0.2–1.2)
BUN: 17 mg/dL (ref 7–25)
CO2: 25 mmol/L (ref 20–31)
CREATININE: 1.25 mg/dL — AB (ref 0.60–0.93)
Calcium: 9.2 mg/dL (ref 8.6–10.4)
Chloride: 104 mmol/L (ref 98–110)
GFR, Est African American: 50 mL/min — ABNORMAL LOW (ref >=60)
GFR, Est Non African American: 43 mL/min — ABNORMAL LOW (ref >=60)
GLUCOSE: 127 mg/dL — AB (ref 65–99)
Potassium: 4.4 mmol/L (ref 3.5–5.3)
SODIUM: 138 mmol/L (ref 135–146)
TOTAL PROTEIN: 6.6 g/dL (ref 6.1–8.1)

## 2017-01-04 LAB — HEMOGLOBIN A1C
Hgb A1c MFr Bld: 6.9 % — ABNORMAL HIGH (ref <5.7)
Mean Plasma Glucose: 151 mg/dL

## 2017-01-09 ENCOUNTER — Ambulatory Visit: Payer: Medicare Other | Admitting: Rehabilitative and Restorative Service Providers"

## 2017-01-10 ENCOUNTER — Other Ambulatory Visit: Payer: Self-pay | Admitting: Osteopathic Medicine

## 2017-01-31 ENCOUNTER — Ambulatory Visit: Payer: Medicare Other | Admitting: Adult Health

## 2017-02-03 ENCOUNTER — Encounter: Payer: Self-pay | Admitting: Adult Health

## 2017-02-06 ENCOUNTER — Ambulatory Visit: Payer: Medicare Other | Admitting: Adult Health

## 2017-03-01 ENCOUNTER — Other Ambulatory Visit: Payer: Self-pay | Admitting: Osteopathic Medicine

## 2017-03-01 DIAGNOSIS — E119 Type 2 diabetes mellitus without complications: Secondary | ICD-10-CM

## 2017-03-07 ENCOUNTER — Telehealth: Payer: Self-pay | Admitting: Osteopathic Medicine

## 2017-03-08 ENCOUNTER — Other Ambulatory Visit: Payer: Self-pay | Admitting: *Deleted

## 2017-03-08 DIAGNOSIS — E119 Type 2 diabetes mellitus without complications: Secondary | ICD-10-CM

## 2017-03-08 DIAGNOSIS — G894 Chronic pain syndrome: Secondary | ICD-10-CM

## 2017-03-08 DIAGNOSIS — I10 Essential (primary) hypertension: Secondary | ICD-10-CM

## 2017-03-08 DIAGNOSIS — Z780 Asymptomatic menopausal state: Secondary | ICD-10-CM

## 2017-03-08 MED ORDER — LOSARTAN POTASSIUM 100 MG PO TABS: 100.0000 mg | ORAL_TABLET | Freq: Every day | ORAL | 1 refills | Status: DC

## 2017-03-08 MED ORDER — DULOXETINE HCL 30 MG PO CPEP: 90.0000 mg | ORAL_CAPSULE | Freq: Every day | ORAL | 1 refills | Status: DC

## 2017-03-08 MED ORDER — MONTELUKAST SODIUM 10 MG PO TABS: 10.0000 mg | ORAL_TABLET | Freq: Every day | ORAL | 3 refills | Status: DC

## 2017-03-08 MED ORDER — TOPIRAMATE 50 MG PO TABS: 150.0000 mg | ORAL_TABLET | Freq: Every day | ORAL | 3 refills | Status: DC

## 2017-03-08 MED ORDER — METOPROLOL SUCCINATE ER 50 MG PO TB24: 50.0000 mg | ORAL_TABLET | Freq: Every day | ORAL | 1 refills | Status: DC

## 2017-03-08 MED ORDER — RABEPRAZOLE SODIUM 20 MG PO TBEC: 20.0000 mg | DELAYED_RELEASE_TABLET | Freq: Every day | ORAL | 3 refills | Status: DC

## 2017-03-08 MED ORDER — CYCLOBENZAPRINE HCL 10 MG PO TABS: 10.0000 mg | ORAL_TABLET | Freq: Three times a day (TID) | ORAL | 0 refills | Status: DC

## 2017-03-08 MED ORDER — CALCIUM CARBONATE-VITAMIN D 600-400 MG-UNIT PO CHEW: 2.0000 | CHEWABLE_TABLET | Freq: Every day | ORAL | 3 refills | Status: DC

## 2017-03-08 MED ORDER — GLYBURIDE 2.5 MG PO TABS: 2.5000 mg | ORAL_TABLET | Freq: Every day | ORAL | 1 refills | Status: DC

## 2017-03-08 MED ORDER — FLUTICASONE PROPIONATE 50 MCG/ACT NA SUSP: 1.0000 | Freq: Every day | NASAL | 3 refills | Status: AC

## 2017-03-08 NOTE — Telephone Encounter (Signed)
Done

## 2017-03-08 NOTE — Telephone Encounter (Signed)
Receive refill request for new pharmacy, medication list was reviewed and fax paper was notated with instructions, was placed in medical assistant in-basket

## 2017-04-04 ENCOUNTER — Ambulatory Visit (INDEPENDENT_AMBULATORY_CARE_PROVIDER_SITE_OTHER): Payer: Medicare Other | Admitting: Osteopathic Medicine

## 2017-04-04 ENCOUNTER — Encounter: Payer: Self-pay | Admitting: Osteopathic Medicine

## 2017-04-04 VITALS — BP 122/79 | HR 86 | Wt 204.0 lb

## 2017-04-04 DIAGNOSIS — M5136 Other intervertebral disc degeneration, lumbar region: Secondary | ICD-10-CM

## 2017-04-04 DIAGNOSIS — E1121 Type 2 diabetes mellitus with diabetic nephropathy: Secondary | ICD-10-CM | POA: Diagnosis not present

## 2017-04-04 DIAGNOSIS — M5126 Other intervertebral disc displacement, lumbar region: Secondary | ICD-10-CM | POA: Diagnosis not present

## 2017-04-04 DIAGNOSIS — S4992XD Unspecified injury of left shoulder and upper arm, subsequent encounter: Secondary | ICD-10-CM | POA: Diagnosis not present

## 2017-04-04 LAB — POCT GLYCOSYLATED HEMOGLOBIN (HGB A1C): Hemoglobin A1C: 6.7

## 2017-04-04 MED ORDER — FENTANYL 50 MCG/HR TD PT72: 50.0000 ug | MEDICATED_PATCH | TRANSDERMAL | 0 refills | Status: DC

## 2017-04-04 MED ORDER — DICLOFENAC SODIUM 1 % TD GEL: 2.0000 g | Freq: Four times a day (QID) | TRANSDERMAL | 5 refills | Status: DC

## 2017-04-04 NOTE — Progress Notes (Signed)
HPI: Teresa Medina is a 71 y.o. female  who presents to [facility] today, 04/04/17,  for chief complaint of:  Chief Complaint  Patient presents with  . Follow-up    Chronic pain: Patient is on fentanyl patches chronically, doing well on these. NCCSRS reviewed and no concerns.   Recent fall: Mechanical fall, did not lift leg up high enough when walking upstairs, foot cooked on step and she fell forward onto knees, she hurt her L shoulder and knees a bit. Shoulder pain has persisted and she is worried about it. Has trouble unhooking her bra, but otherwise functioning okay.   Diabetes: A1C today looking good. Doing well with diabetic diet most of the time.     Past medical history, surgical history, social history and family history reviewed.  Patient Active Problem List   Diagnosis Date Noted  . Type 2 diabetes with nephropathy (HCC) 11/29/2015  . Chronic kidney disease (CKD) 11/29/2015  . Hx of bone density study 11/29/2015  . Postmenopausal 08/31/2015  . Chronic pain syndrome 06/23/2015  . Levoscoliosis 06/23/2015  . Retrolisthesis of vertebrae 06/23/2015  . Degenerative disc disease, lumbar 06/23/2015  . Herniation of intervertebral disc between L4 and L5 06/23/2015  . Type 2 diabetes mellitus (HCC) 06/02/2015  . Essential hypertension 06/02/2015  . Migraine 06/02/2015  . Seasonal allergies 06/02/2015  . History of vitamin D deficiency 06/02/2015  . Osteoporosis 06/02/2015  . GERD (gastroesophageal reflux disease) 06/02/2015    Current medication list and allergy/intolerance information reviewed.   Current Outpatient Prescriptions on File Prior to Visit  Medication Sig Dispense Refill  . AMBULATORY NON FORMULARY MEDICATION Medication Name: Zostavax IM x 1 vial 1 Units 0  . Calcium Carbonate-Vitamin D 600-400 MG-UNIT chew tablet Chew 2 tablets by mouth daily. 180 tablet 3  . Cholecalciferol (VITAMIN D-3) 1000 UNITS CAPS Take by mouth.     . cyclobenzaprine (FLEXERIL) 10 MG tablet Take 1 tablet (10 mg total) by mouth 3 (three) times daily. 90 tablet 0  . DULoxetine (CYMBALTA) 30 MG capsule Take 3 capsules (90 mg total) by mouth daily. 270 capsule 1  . fluticasone (FLONASE) 50 MCG/ACT nasal spray Place 1 spray into both nostrils daily. 48 g 3  . glucose blood test strip by Other route. Free Style Test Strips    . glyBURIDE (DIABETA) 2.5 MG tablet Take 1 tablet (2.5 mg total) by mouth daily with breakfast. 90 tablet 1  . losartan (COZAAR) 100 MG tablet Take 1 tablet (100 mg total) by mouth daily. 90 tablet 1  . metoprolol succinate (TOPROL-XL) 50 MG 24 hr tablet Take 1 tablet (50 mg total) by mouth daily. Take with or immediately following a meal. 90 tablet 1  . montelukast (SINGULAIR) 10 MG tablet Take 1 tablet (10 mg total) by mouth at bedtime. 90 tablet 3  . RABEprazole (ACIPHEX) 20 MG tablet Take 1 tablet (20 mg total) by mouth daily. 90 tablet 3  . SUMAtriptan (IMITREX) 100 MG tablet TAKE ONE TABLET BY MOUTH AS NEEDED FOR MIGRAINE. MAY REPEAT IN TWO HOURS IF HEADACHE PERSISTS OR RECURS. 30 tablet 0  . topiramate (TOPAMAX) 50 MG tablet Take 3 tablets (150 mg total) by mouth at bedtime. 270 tablet 3   No current facility-administered medications on file prior to visit.    No Known Allergies    Review of Systems:  Constitutional: No recent illness  Cardiac: No  chest pain, No  pressure  Respiratory:  No  shortness  of breath. No  Cough  Musculoskeletal: +new myalgia/arthralgia - shoulder pain on L.   Neurologic: No  weakness, No  Dizziness  Psychiatric: No  concerns with depression, No  concerns with anxiety  Exam:  BP 122/79   Pulse 86   Wt 204 lb (92.5 kg)   BMI 38.55 kg/m   Constitutional: VS see above. General Appearance: alert, well-developed, well-nourished, NAD  Eyes: Normal lids and conjunctive, non-icteric sclera  Ears, Nose, Mouth, Throat: MMM, Normal external inspection  ears/nares/mouth/lips/gums.  Neck: No masses, trachea midline.   Respiratory: Normal respiratory effort. no wheeze, no rhonchi, no rales  Cardiovascular: S1/S2 normal, no murmur, no rub/gallop auscultated. RRR.   Musculoskeletal: Gait normal. Symmetric and independent movement of all extremities. Shoulder: negative drop arm and empty can, negative speeds/yergason's, mild (+) posterior apprehension exam, neg cross-arm test   Neurological: Normal balance/coordination. No tremor.  Skin: warm, dry, intact.   Psychiatric: Normal judgment/insight. Normal mood and affect. Oriented x3.   Results for orders placed or performed in visit on 04/04/17 (from the past 48 hour(s))  POCT HgB A1C     Status: None   Collection Time: 04/04/17  1:53 PM  Result Value Ref Range   Hemoglobin A1C 6.7      ASSESSMENT/PLAN:   Type 2 diabetes with nephropathy (HCC) - Plan: POCT HgB A1C  Degenerative disc disease, lumbar - Plan: fentaNYL (DURAGESIC - DOSED MCG/HR) 50 MCG/HR  Herniation of intervertebral disc between L4 and L5 - Plan: fentaNYL (DURAGESIC - DOSED MCG/HR) 50 MCG/HR  Injury of left shoulder, subsequent encounter - Plan: DG Shoulder Left, diclofenac sodium (VOLTAREN) 1 % GEL   Patient Instructions  Plan:  If shoulder pain is not better or if it gets worse, I would recommend follow-up with one of our sports medicine specialists (Dr Denyse Amass or Dr. Cherylann Parr Dr. Karie Schwalbe) for further evaluation in 1 weeks. Just call our office and ask for an appointment for sports medicine!   Otherwise, come see me in 3 months for refills and recheck sugars     Follow-up plan: Return in about 3 months (around 07/05/2017) for refills, recheck diabetes .  Visit summary with medication list and pertinent instructions was printed for patient to review, alert [place] if any changes needed. All questions at time of visit were answered - patient instructed to contact office with any additional concerns. ER/RTC precautions  were reviewed with the patient and understanding verbalized.

## 2017-04-04 NOTE — Patient Instructions (Signed)
Plan:  If shoulder pain is not better or if it gets worse, I would recommend follow-up with one of our sports medicine specialists (Dr Denyse Amass or Dr. Cherylann Parr Dr. Karie Schwalbe) for further evaluation in 1 weeks. Just call our office and ask for an appointment for sports medicine!   Otherwise, come see me in 3 months for refills and recheck sugars

## 2017-04-12 ENCOUNTER — Telehealth: Payer: Self-pay

## 2017-04-12 DIAGNOSIS — Z8639 Personal history of other endocrine, nutritional and metabolic disease: Secondary | ICD-10-CM

## 2017-04-12 MED ORDER — SUMATRIPTAN SUCCINATE 100 MG PO TABS: ORAL_TABLET | ORAL | 0 refills | Status: DC

## 2017-04-12 NOTE — Telephone Encounter (Signed)
Patient request refill for Imitrex 100 mg #30 0 refills sent to [facility]. Rhonda Cunningham,CMA

## 2017-04-16 ENCOUNTER — Ambulatory Visit (INDEPENDENT_AMBULATORY_CARE_PROVIDER_SITE_OTHER): Payer: Medicare Other | Admitting: Family Medicine

## 2017-04-16 ENCOUNTER — Encounter: Payer: Self-pay | Admitting: Family Medicine

## 2017-04-16 ENCOUNTER — Ambulatory Visit (INDEPENDENT_AMBULATORY_CARE_PROVIDER_SITE_OTHER): Payer: Medicare Other

## 2017-04-16 VITALS — BP 131/70 | HR 81 | Wt 204.0 lb

## 2017-04-16 DIAGNOSIS — M25512 Pain in left shoulder: Secondary | ICD-10-CM | POA: Diagnosis not present

## 2017-04-16 NOTE — Patient Instructions (Signed)
Thank you for coming in today. I am concerned that you may have broken your shoulder blade (Scapula).  We will likely be using a CT scan to figure out exactly what is wrong.    Scapular Fracture A scapular fracture is a break in the large, triangular bone behind your shoulder (shoulder blade or scapula). This bone makes up the socket joint of your shoulder. The scapula is well protected by muscles, so scapular fractures are unusual injuries. They often involve a lot of force. People who have a scapular fracture often have other injuries as well. These may be injuries to the lung, spine, head, shoulder, or ribs. What are the causes? Typical causes of a scapular fracture include:  A fall from a significant height.  A car or motorcycle accident.  A heavy, direct blow to the scapula.  What are the signs or symptoms? The main symptom of a scapular fracture is severe pain when you try to move your arm. Other signs and symptoms include:  Swelling behind the shoulder.  Bruising.  Holding the arm still and close to the body.  How is this diagnosed? Your health care provider may suspect a scapular fracture based on your symptoms and the details of a recent injury. A physical exam will be done. Tests may also be done to confirm the diagnosis. These may include a regular X-ray or a CT scan. Your health care provider will also use these tests to check for injuries to other parts of your body. How is this treated? Treatment options for a scapular fracture include:  Immobilization. Your health care provider may put your arm in a sling and wrap a support bandage around your chest. The health care provider will explain how to move your shoulder for the first week after your injury to prevent stiffness. The sling can be removed as your movement increases and your pain decreases.  Surgery. Surgery is rarely needed. You may need surgery if the bone pieces are out of place (displaced fracture). You may  also need surgery if the fracture causes the bone to be deformed. In this case, the broken scapula will be put back into position and held in place with a surgical plate and screws.  Physical therapy. A physical therapist will teach you exercises to stretch and strengthen your shoulder. The goal is to keep your shoulder from getting stiff or frozen. You may need to do these exercises for 6-12 months.  Follow these instructions at home:  Apply ice to the back of your shoulder: ? Put ice in a plastic bag. ? Place a towel between your skin and the bag. ? Leave the ice on for 20 minutes, 2-3 times per day. If you have a splint and a wrap, wear them all of the time. Remove them only to take a shower or to do your physical therapy exercises.  Take medicines only as directed by your health care provider.  Ask your health care provider: ? When you can stop wearing the splint and wrap. ? When you can do all of your usual activities again. Contact a health care provider if:  You have pain that is not helped by pain medicine.  You are unable to do your physical therapy because of pain or stiffness. Get help right away if:  You are short of breath.  You cough up blood.  You cannot move your arm or your fingers. This information is not intended to replace advice given to you by your health care  provider. Make sure you discuss any questions you have with your health care provider. Document Released: 07/30/2005 Document Revised: 01/05/2016 Document Reviewed: 12/30/2013 Elsevier Interactive Patient Education  2018 [employer].

## 2017-04-16 NOTE — Progress Notes (Signed)
Teresa Medina is a 71 y.o. female who presents to [facility] today for left shoulder pain.   Lesia fell on her left shoulder and slid into a wall in March of 2018. She had pain that has mostly resolved. She notes continued superior shoulder pain that does not radiate. The pain is worse with reaching across her body, reaching to her back to strap her bra and at night. She notes that the pain will sometimes wake her from sleep and can be severe at times. The pain is interfering with her ability to care for herself normally and is obnoxious.  She did not seek evaluation for her left shoulder until recently. She has not had any treatment yet.    Past Medical History:  Diagnosis Date  . Arthritis   . Chronic kidney disease (CKD) 11/29/2015   next visit: get UA, Phos, Vit D, PTH, CMP   . Chronic pain syndrome 06/23/2015   Records reviewed: pt has been on Fentanyl TD, Cymbalta, Flexeril  . Degenerative disc disease, lumbar 06/23/2015   MRI 06/2014 most severe L4-5  . Depression   . Diabetes mellitus without complication (HCC)   . Essential hypertension 06/02/2015  . GERD (gastroesophageal reflux disease) 06/02/2015  . Herniation of intervertebral disc between L4 and L5 06/23/2015   MRI 06/2014 moderate to severe R foraminal narrowing  . History of vitamin D deficiency 06/02/2015  . Hypertension   . Levoscoliosis 06/23/2015   Mild, MRI 06/22/14  . Migraine 06/02/2015  . Osteoporosis 06/02/2015  . Retrolisthesis of vertebrae 06/23/2015   Gr1 L1 on L2 MRI 06/2014  . Seasonal allergies 06/02/2015  . Type 2 diabetes mellitus (HCC) 06/02/2015  . Type 2 diabetes with nephropathy (HCC) 11/29/2015   DIABETES SCREENING/PREVENTIVE CARE: Updated 03/05/16  A1C past 3-6 mos: Yes, 03/05/16 6.6% controlled? Yes BP goal <140/90: Yes  LDL goal <70: no - 100 11/24/15 Eye exam annually: Yes , importance discussed with patient Foot exam:  08/31/15  Microalbuminuria:   not applicable, patient is on ACE/ARB Metformin: No - declines ACE/ARB: Yes  Antiplatelet if ASCVD Risk >10%: Recommended Statin: No    Past Surgical History:  Procedure Laterality Date  . BUNIONECTOMY Left 1993  . FOOT SURGERY  2008  . FOOT SURGERY  2003  . NECK SURGERY  1993  . REPLACEMENT TOTAL KNEE Bilateral 1999   R in Jan, L in Apr  . TONSILLECTOMY  1952   Social History  Substance Use Topics  . Smoking status: Never Smoker  . Smokeless tobacco: Never Used  . Alcohol use No     ROS:  As above   Medications: Current Outpatient Prescriptions  Medication Sig Dispense Refill  . AMBULATORY NON FORMULARY MEDICATION Medication Name: Zostavax IM x 1 vial 1 Units 0  . Calcium Carbonate-Vitamin D 600-400 MG-UNIT chew tablet Chew 2 tablets by mouth daily. 180 tablet 3  . Cholecalciferol (VITAMIN D-3) 1000 UNITS CAPS Take by mouth.    . cyclobenzaprine (FLEXERIL) 10 MG tablet Take 1 tablet (10 mg total) by mouth 3 (three) times daily. 90 tablet 0  . diclofenac sodium (VOLTAREN) 1 % GEL Apply 2-4 g topically 4 (four) times daily. To affected joint as needed 100 g 5  . DULoxetine (CYMBALTA) 30 MG capsule Take 3 capsules (90 mg total) by mouth daily. 270 capsule 1  . [START ON 06/03/2017] fentaNYL (DURAGESIC - DOSED MCG/HR) 50 MCG/HR Place 1 patch (50 mcg total) onto the skin every 3 (  three) days. 10 patch 0  . fluticasone (FLONASE) 50 MCG/ACT nasal spray Place 1 spray into both nostrils daily. 48 g 3  . glucose blood test strip by Other route. Free Style Test Strips    . glyBURIDE (DIABETA) 2.5 MG tablet Take 1 tablet (2.5 mg total) by mouth daily with breakfast. 90 tablet 1  . losartan (COZAAR) 100 MG tablet Take 1 tablet (100 mg total) by mouth daily. 90 tablet 1  . metoprolol succinate (TOPROL-XL) 50 MG 24 hr tablet Take 1 tablet (50 mg total) by mouth daily. Take with or immediately following a meal. 90 tablet 1  . montelukast (SINGULAIR) 10 MG tablet Take 1  tablet (10 mg total) by mouth at bedtime. 90 tablet 3  . RABEprazole (ACIPHEX) 20 MG tablet Take 1 tablet (20 mg total) by mouth daily. 90 tablet 3  . SUMAtriptan (IMITREX) 100 MG tablet TAKE ONE TABLET BY MOUTH AS NEEDED FOR MIGRAINE. MAY REPEAT IN TWO HOURS IF HEADACHE PERSISTS OR RECURS. 30 tablet 0  . topiramate (TOPAMAX) 50 MG tablet Take 3 tablets (150 mg total) by mouth at bedtime. 270 tablet 3   No current facility-administered medications for this visit.    No Known Allergies   Exam:  BP 131/70   Pulse 81   Wt 204 lb (92.5 kg)   BMI 38.55 kg/m  General: Well Developed, well nourished, and in no acute distress.  Neuro/Psych: Alert and oriented x3, extra-ocular muscles intact, able to move all 4 extremities, sensation grossly intact. Skin: Warm and dry, no rashes noted.  Respiratory: Not using accessory muscles, speaking in full sentences, trachea midline.  Cardiovascular: Pulses palpable, no extremity edema. Abdomen: Does not appear distended. MSK: left shoulder is normal appearing.  Non-tender.  ROM: Abduction and Forward flexion normal.  Internal rotation to the lumbar spine External rotation 30 degrees past the neutral position.   Negative Hawking, Neers test, Yergson and Speeds test.  Negative empty can.   Strength is intact.   Xray Left Shoulder: Abnormal appearance of the scapula. Question old healed fracture Awaiting formal radiology review.      Assessment and Plan: 71 y.o. female with shoulder pain. Concerning for old scapular fracture. We may consider a CT scan of the scapula versus MRI of the shoulder. We will make a decision about treatment based on results of shoulder x-ray.    Orders Placed This Encounter  Procedures  . DG Shoulder Left    Standing Status:   Future    Number of Occurrences:   1    Standing Expiration Date:   06/16/2018    Order Specific Question:   Reason for Exam (SYMPTOM  OR DIAGNOSIS REQUIRED)    Answer:   eval pain since  fall March 2018. Pain superior shoulder    Order Specific Question:   Preferred imaging location?    Answer:   [facility]    Order Specific Question:   Radiology Contrast Protocol - do NOT remove file path    Answer:   [email or web address]   No orders of the defined types were placed in this encounter.   Discussed warning signs or symptoms. Please see discharge instructions. Patient expresses understanding.

## 2017-04-18 ENCOUNTER — Telehealth: Payer: Self-pay | Admitting: Family Medicine

## 2017-04-18 DIAGNOSIS — M25512 Pain in left shoulder: Secondary | ICD-10-CM

## 2017-04-18 NOTE — Telephone Encounter (Signed)
Xray shoulder did not show an obvious fracture like we were worried about.  I think think a next good step would be physical therapy.  Referral placed.

## 2017-04-19 NOTE — Telephone Encounter (Signed)
Called pt. Phone number not in service.. Will send message via mychart.

## 2017-04-25 ENCOUNTER — Encounter: Payer: Self-pay | Admitting: Physical Therapy

## 2017-04-25 ENCOUNTER — Ambulatory Visit (INDEPENDENT_AMBULATORY_CARE_PROVIDER_SITE_OTHER): Payer: Medicare Other | Admitting: Physical Therapy

## 2017-04-25 DIAGNOSIS — M25512 Pain in left shoulder: Secondary | ICD-10-CM

## 2017-04-25 DIAGNOSIS — R296 Repeated falls: Secondary | ICD-10-CM | POA: Diagnosis not present

## 2017-04-25 DIAGNOSIS — R293 Abnormal posture: Secondary | ICD-10-CM | POA: Diagnosis not present

## 2017-04-25 DIAGNOSIS — M6281 Muscle weakness (generalized): Secondary | ICD-10-CM | POA: Diagnosis present

## 2017-04-25 NOTE — Patient Instructions (Addendum)
Decompression Exercise: Basic    Lie on back on firm surface, knees bent, feet flat, arms turned up, out to sides, backs of hands down. Time _3-5__ minutes. Surface: bed. Once a day.    Shoulder Press    Press both shoulders down. Hold _2-3__ seconds. Repeat __10_ times. Press one shoulder down. Hold _2-3__ seconds Repeat _10__ times. Do other shoulder. If unable to press one or both shoulders, lie in position a few sessions until you can. Surface: bed. Once a day.    Head Press With Chin Tuck    Tuck chin SLIGHTLY toward chest, keep mouth closed. Feel weight on back of head. Increase weight by pressing head down. Hold ___ seconds. Relax. Repeat ___ times. Surface: floor   POSTURAL CORRECTION Tips C    VISUALIZING two plumb lines will help you think of your body as a whole unit. The Side Plumb Line is a straight line passing through the ear, shoulder, hip, just behind the knee, and front of the ankle. The Front / Back Plumb Line is a straight line passing through the middle of the body, dividing the body side to side.   Think Taller    Stand 2-4 inches taller. Imagine a string, golden thread, rope, or steel cable pulling up at the crown of the head. Avoid tilting head back - keep chin parallel to floor. Do not shrug shoulders. Practice frequently during the day.  Wing Spread    Pretend collarbones are wings. Spread wings, expanding front of chest. DO NOT PULL SHOULDERS BACK. Practice frequently during the day.  Foot Triangle / [employer]    Stand, feet hip-width apart, on [employer] Triangle of Support. Even weight between feet. Even weight between ball and heel of each foot. Press feet into floor. Feel postural muscles contract. Practice frequently during the day.  ROM: Flexion - Wand (Supine)    Lie on back holding wand. Raise arms over head. Repeat __10__ times per set. Do ___1_ sets per session. Do __1__ sessions per day.  ROM: External / Internal Rotation -  Wand    Bring wand up over head, then down toward waistline. Hold each position __1-2__ seconds. Repeat __10__ times per set. Do __1__ sets per session. Do __1__ sessions per day.

## 2017-04-25 NOTE — Therapy (Signed)
[facility] [place] [address] [place], [place], [address] Phone: [phone]   Fax:  [phone]  Physical Therapy Evaluation  Patient Details  Name: Teresa Medina MRN: [id number] Date of Birth: 06-Oct-1945 Referring Provider: Dr Clementeen Graham  Encounter Date: 04/25/2017      PT End of Session - 04/25/17 1453    Visit Number 1   Number of Visits 16   Date for PT Re-Evaluation 06/20/17   PT Start Time 1453   PT Stop Time 1603   PT Time Calculation (min) 70 min      Past Medical History:  Diagnosis Date  . Arthritis   . Chronic kidney disease (CKD) 11/29/2015   next visit: get UA, Phos, Vit D, PTH, CMP   . Chronic pain syndrome 06/23/2015   Records reviewed: pt has been on Fentanyl TD, Cymbalta, Flexeril  . Degenerative disc disease, lumbar 06/23/2015   MRI 06/2014 most severe L4-5  . Depression   . Diabetes mellitus without complication (HCC)   . Essential hypertension 06/02/2015  . GERD (gastroesophageal reflux disease) 06/02/2015  . Herniation of intervertebral disc between L4 and L5 06/23/2015   MRI 06/2014 moderate to severe R foraminal narrowing  . History of vitamin D deficiency 06/02/2015  . Hypertension   . Levoscoliosis 06/23/2015   Mild, MRI 06/22/14  . Migraine 06/02/2015  . Osteoporosis 06/02/2015  . Retrolisthesis of vertebrae 06/23/2015   Gr1 L1 on L2 MRI 06/2014  . Seasonal allergies 06/02/2015  . Type 2 diabetes mellitus (HCC) 06/02/2015  . Type 2 diabetes with nephropathy (HCC) 11/29/2015   DIABETES SCREENING/PREVENTIVE CARE: Updated 03/05/16  A1C past 3-6 mos: Yes, 03/05/16 6.6% controlled? Yes BP goal <140/90: Yes  LDL goal <70: no - 100 11/24/15 Eye exam annually: Yes , importance discussed with patient Foot exam:  08/31/15 Microalbuminuria:   not applicable, patient is on ACE/ARB Metformin: No - declines ACE/ARB: Yes  Antiplatelet if ASCVD Risk >10%: Recommended Statin: No     Past Surgical  History:  Procedure Laterality Date  . BUNIONECTOMY Left 1993  . FOOT SURGERY  2008  . FOOT SURGERY  2003  . NECK SURGERY  1993  . REPLACEMENT TOTAL KNEE Bilateral 1999   R in Jan, L in Apr  . TONSILLECTOMY  1952    There were no vitals filed for this visit.       Subjective Assessment - 04/25/17 1455    Subjective Pt reports she has started to fall a lot, this most recent one in March and resulted in the Lt shoulder pain.  She slid across the kitchen floor landing on her Lt side, bruised the upper back and shoulder.    Pertinent History cervical surgery 25 yrs ago - pt not sure what they did.  L4-5 disc issues, osteoporosis, bilat TKA's 20 yrs ago. Pt reports bilat rotator cuffs were hurt 22 yrs ago and hasn't been throwing balls since.    Diagnostic tests x-rays ( -) for fractures.    Patient Stated Goals get back into using her arm again.    Currently in Pain? No/denies   Pain Location Shoulder   Pain Orientation Left   Pain Descriptors / Indicators --  feels like " the skin is coming away from her bones"   Pain Type Chronic pain   Pain Onset More than a month ago   Pain Frequency Intermittent   Aggravating Factors  at night with watching TV and lying in bed the pain wakes  her up 5/10, reaching up across her body    Pain Relieving Factors resting the arm by her side.             [facility] PT Assessment - 04/25/17 0001      Assessment   Medical Diagnosis Lt shoulder pain   Referring Provider Dr Clementeen Graham   Onset Date/Surgical Date 10/23/16   Hand Dominance Right   Next MD Visit PRN   Prior Therapy none     Precautions   Precautions None   Required Braces or Orthoses --  none     Balance Screen   Has the patient fallen in the past 6 months Yes   How many times? unable to put a number on it at least 2. She reports her legs will start to feel heavy and she has a hard time picking them up when she is walking.    Has the patient had a decrease in activity level  because of a fear of falling?  No   Is the patient reluctant to leave their home because of a fear of falling?  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      Observation/Other Assessments   Focus on Therapeutic Outcomes (FOTO)  48% limited     Sensation   Hot/Cold Impaired Detail   Hot/Cold Impaired Details Impaired RLE;Impaired LLE     Posture/Postural Control   Posture/Postural Control Postural limitations   Postural Limitations Forward head;Rounded Shoulders;Increased thoracic kyphosis   Posture Comments bilat feet ER with pronation     ROM / Strength   AROM / PROM / Strength Strength;AROM     AROM   AROM Assessment Site Shoulder;Elbow;Cervical   Right/Left Shoulder Left;Right   Right Shoulder Extension 30 Degrees   Right Shoulder Flexion 150 Degrees   Right Shoulder ABduction 118 Degrees   Right Shoulder Internal Rotation --  reach behind back to L3   Right Shoulder External Rotation --  reach behind head WNL   Left Shoulder Extension 30 Degrees   Left Shoulder Flexion 140 Degrees   Left Shoulder ABduction 100 Degrees   Left Shoulder Internal Rotation --  reach behind back to outside of hip   Left Shoulder External Rotation --  reach behind head WNL.    Cervical Flexion to chest   Cervical Extension 4   Cervical - Right Rotation 50   Cervical - Left Rotation 42     Strength   Strength Assessment Site Shoulder;Elbow   Right/Left Shoulder --  WNL in available ROM   Right/Left Elbow --  WNL     Palpation   Palpation comment very tight and tender in bilat upper traps, cervical and thoracic area.      Ambulation/Gait   Assistive  device Straight cane  started using it outside of the house in June of this year.      Standardized Balance Assessment   Standardized Balance Assessment Berg Balance Test     Berg Balance Test   Sit to Stand Able to stand without using hands and stabilize independently   Standing Unsupported Able to stand safely 2 minutes  Sitting with Back Unsupported but Feet Supported on Floor or Stool Able to sit safely and securely 2 minutes   Stand to Sit Sits safely with minimal use of hands   Transfers Able to transfer safely, minor use of hands   Standing Unsupported with Eyes Closed Able to stand 10 seconds safely   Standing Ubsupported with Feet Together Needs help to attain position but able to stand for 30 seconds with feet together   From Standing, Reach Forward with Outstretched Arm Can reach forward >12 cm safely (5")   From Standing Position, Pick up Object from Floor Unable to pick up shoe, but reaches 2-5 cm (1-2") from shoe and balances independently   From Standing Position, Turn to Look Behind Over each Shoulder Looks behind one side only/other side shows less weight shift  difficulty going to the Lt   Turn 360 Degrees Able to turn 360 degrees safely but slowly  Rt 7 sec , Lt 6 sec   Standing Unsupported, Alternately Place Feet on Step/Stool Able to stand independently and safely and complete 8 steps in 20 seconds  11 sec with close supervision   Standing Unsupported, One Foot in Front Needs help to step but can hold 15 seconds   Standing on One Leg Tries to lift leg/unable to hold 3 seconds but remains standing independently  Rt 1sec, Lt 2sec   Total Score 41            Objective measurements completed on examination: See above findings.          [facility] Adult PT Treatment/Exercise - 04/25/17 0001      Exercises   Exercises Shoulder     Shoulder Exercises: Supine   External Rotation AAROM;Both;10 reps   Flexion AAROM;Both;10 reps   Other Supine Exercises  decompression position, shoulder presses, head presses  Reviewed walking and standing tall philosophy.                 PT Education - 04/25/17 1450    Education provided Yes   Education Details HEP   Person(s) Educated Patient   Methods Demonstration;Explanation;Handout   Comprehension Returned demonstration;Verbalized understanding          PT Short Term Goals - 04/25/17 1551      PT SHORT TERM GOAL #1   Title I with initial HEP ( 05/23/17)    Time 4   Period Weeks   Status New     PT SHORT TERM GOAL #2   Title improve BERG =/> 48/56 to decrease fall risk ( 05/23/17)    Time 4   Period Weeks   Status New     PT SHORT TERM GOAL #3   Title improve bilat shoulder ROM to allow pt to hook her bra per her baseline ( 05/23/17)    Time 4   Period Weeks   Status New     PT SHORT TERM GOAL #4   Title improve FOTO =/< 38% limited ( 05/23/17)    Time 4   Period Weeks   Status New     PT SHORT TERM GOAL #5   Title improve cervical rotation =/< 60 degrees ( 05/23/17)    Time 4   Period Weeks   Status New           PT Long Term Goals - 04/25/17 1449      PT LONG TERM GOAL #1   Title I with HEP ( 06/20/17)    Time 8  Period Weeks   Status New     PT LONG TERM GOAL #2   Title improve FOTO =/<38 % limited, CJ level ( 06/20/17)    Time 8   Period Weeks   Status New     PT LONG TERM GOAL #3   Title report =/> 75% reduction in Lt UE pain with daily activities ( 06/20/17)    Time 8   Period Weeks   Status New     PT LONG TERM GOAL #4   Title maintain upright posture with head over shoulders and looking straight ahead without verbal cues ( 06/20/17)    Time 8   Period Weeks   Status New     PT LONG TERM GOAL #5   Title report =/> 75% improvement in her confidence with walking safely ( 06/20/17)    Time 8   Period Weeks   Status New                Plan - 05-06-17 1759    Clinical Impression Statement 71 yo female referred to PT for Lt  shoulder pain after a fall.  She has multiple issues going on.  S/p multiple orthopedic surgeries, significant postural changes that stress the back and shoulder complex, osteoprosis, weakness, muscle spasms and balance issues.   Patient also admitted she is not a big fan of performing exercise and has a very sedentary life style .    Clinical Presentation Evolving   Clinical Decision Making Moderate   Rehab Potential Good   PT Frequency 2x / week   PT Duration 8 weeks   PT Treatment/Interventions Moist Heat;Ultrasound;Therapeutic exercise;Dry needling;Taping;Manual techniques;Neuromuscular re-education;DME Instruction;Cryotherapy;Electrical Stimulation;Iontophoresis /ml Dexamethasone;Patient/family education;Passive range of motion   PT Next Visit Plan postural work, back of the body strengthening and balance work.    Consulted and Agree with Plan of Care Patient      Patient will benefit from skilled therapeutic intervention in order to improve the following deficits and impairments:  Decreased range of motion, Difficulty walking, Pain, Increased muscle spasms, Decreased strength, Postural dysfunction, Improper body mechanics, Decreased balance, Decreased endurance  Visit Diagnosis: Muscle weakness (generalized) - Plan: PT plan of care cert/re-cert  Acute pain of left shoulder - Plan: PT plan of care cert/re-cert  Abnormal posture - Plan: PT plan of care cert/re-cert  Repeated falls - Plan: PT plan of care cert/re-cert      [facility] PT PB G-CODES - 06-May-2017 1451    Functional Assessment Tool Used  FOTO and professional judgement   Functional Limitations Carrying, moving and handling objects   Carrying, Moving and Handling Objects Current Status [phone]) At least 40 percent but less than 60 percent impaired, limited or restricted   Carrying, Moving and Handling Objects Goal Status (U0454) At least 20 percent but less than 40 percent impaired, limited or restricted       Problem  List Patient Active Problem List   Diagnosis Date Noted  . Type 2 diabetes with nephropathy (HCC) 11/29/2015  . Chronic kidney disease (CKD) 11/29/2015  . Hx of bone density study 11/29/2015  . Postmenopausal 08/31/2015  . Chronic pain syndrome 06/23/2015  . Levoscoliosis 06/23/2015  . Retrolisthesis of vertebrae 06/23/2015  . Degenerative disc disease, lumbar 06/23/2015  . Herniation of intervertebral disc between L4 and L5 06/23/2015  . Type 2 diabetes mellitus (HCC) 06/02/2015  . Essential hypertension 06/02/2015  . Migraine 06/02/2015  . Seasonal allergies 06/02/2015  . History of vitamin D deficiency 06/02/2015  .  Osteoporosis 06/02/2015  . GERD (gastroesophageal reflux disease) 06/02/2015    Darl Pikes Stpehanie Montroy PT  04/25/2017, 6:06 PM  [facility] [address] [place], [place], [address] Phone: [phone]   Fax:  [phone]  Name: Teresa Medina MRN: [id number] Date of Birth: Nov 08, 1945

## 2017-05-01 ENCOUNTER — Encounter: Payer: Self-pay | Admitting: Physical Therapy

## 2017-05-03 ENCOUNTER — Ambulatory Visit (INDEPENDENT_AMBULATORY_CARE_PROVIDER_SITE_OTHER): Payer: Medicare Other | Admitting: Physical Therapy

## 2017-05-03 DIAGNOSIS — R296 Repeated falls: Secondary | ICD-10-CM

## 2017-05-03 DIAGNOSIS — M25512 Pain in left shoulder: Secondary | ICD-10-CM | POA: Diagnosis present

## 2017-05-03 DIAGNOSIS — R293 Abnormal posture: Secondary | ICD-10-CM | POA: Diagnosis not present

## 2017-05-03 DIAGNOSIS — M6281 Muscle weakness (generalized): Secondary | ICD-10-CM | POA: Diagnosis not present

## 2017-05-03 NOTE — Therapy (Signed)
[facility] [place] [address] [place] [address] [place], [place], [address] Phone: [phone]   Fax:  [phone]  Physical Therapy Treatment  Patient Details  Name: Teresa Medina MRN: [id number] Date of Birth: 12-30-1945 Referring Provider: Dr. Clementeen Graham   Encounter Date: 05/03/2017      PT End of Session - 05/03/17 1617    Visit Number 2   Number of Visits 16   Date for PT Re-Evaluation 06/20/17   PT Start Time 1535   PT Stop Time 1615   PT Time Calculation (min) 40 min   Activity Tolerance Patient tolerated treatment well;No increased pain   Behavior During Therapy WFL for tasks assessed/performed      Past Medical History:  Diagnosis Date  . Arthritis   . Chronic kidney disease (CKD) 11/29/2015   next visit: get UA, Phos, Vit D, PTH, CMP   . Chronic pain syndrome 06/23/2015   Records reviewed: pt has been on Fentanyl TD, Cymbalta, Flexeril  . Degenerative disc disease, lumbar 06/23/2015   MRI 06/2014 most severe L4-5  . Depression   . Diabetes mellitus without complication (HCC)   . Essential hypertension 06/02/2015  . GERD (gastroesophageal reflux disease) 06/02/2015  . Herniation of intervertebral disc between L4 and L5 06/23/2015   MRI 06/2014 moderate to severe R foraminal narrowing  . History of vitamin D deficiency 06/02/2015  . Hypertension   . Levoscoliosis 06/23/2015   Mild, MRI 06/22/14  . Migraine 06/02/2015  . Osteoporosis 06/02/2015  . Retrolisthesis of vertebrae 06/23/2015   Gr1 L1 on L2 MRI 06/2014  . Seasonal allergies 06/02/2015  . Type 2 diabetes mellitus (HCC) 06/02/2015  . Type 2 diabetes with nephropathy (HCC) 11/29/2015   DIABETES SCREENING/PREVENTIVE CARE: Updated 03/05/16  A1C past 3-6 mos: Yes, 03/05/16 6.6% controlled? Yes BP goal <140/90: Yes  LDL goal <70: no - 100 11/24/15 Eye exam annually: Yes , importance discussed with patient Foot exam:  08/31/15 Microalbuminuria:   not applicable, patient  is on ACE/ARB Metformin: No - declines ACE/ARB: Yes  Antiplatelet if ASCVD Risk >10%: Recommended Statin: No     Past Surgical History:  Procedure Laterality Date  . BUNIONECTOMY Left 1993  . FOOT SURGERY  2008  . FOOT SURGERY  2003  . NECK SURGERY  1993  . REPLACEMENT TOTAL KNEE Bilateral 1999   R in Jan, L in Apr  . TONSILLECTOMY  1952    There were no vitals filed for this visit.      Subjective Assessment - 05/03/17 1540    Subjective Teresa Medina reports she missed last session due to migraine. Pt reports she did exercises 1x after last session. She has not had any falls since last visit. She has some questions regarding HEP.  She would like to try harder to complete the exercises so she feels well.     Pertinent History cervical surgery 25 yrs ago - pt not sure what they did.  L4-5 disc issues, osteoporosis, bilat TKA's 20 yrs ago. Pt reports bilat rotator cuffs were hurt 22 yrs ago and hasn't been throwing balls since.    Patient Stated Goals get back into using her arm again.    Currently in Pain? No/denies  up to 5/10 with certain motions.    Pain Location Shoulder   Pain Orientation Left            [facility] PT Assessment - 05/03/17 0001      Assessment   Medical Diagnosis Lt shoulder pain  Referring Provider Dr. Clementeen Graham    Onset Date/Surgical Date 10/23/16   Hand Dominance Right   Next MD Visit PRN          [facility] Adult PT Treatment/Exercise - 05/03/17 0001      Lumbar Exercises: Standing   Other Standing Lumbar Exercises Standing posture - with VC for lifting chest, feeling triangle weight in feet x 1 min - no UE support.  semi-tandem stance without UE support x 20 sec each foot forward.      Shoulder Exercises: Supine   External Rotation AAROM;Both;10 reps  cane    Flexion AAROM;Both;10 reps  cane   Other Supine Exercises decompression position x 3 min, shoulder presses 5 sec hold x 10 reps, head presses x 3 sec x 10 reps    Other Supine Exercises elbow  press x 5 sec x 10; snow angel x 10, then prolonged stretch with arms abdct 90 deg x 1 min      Pt required increased time to change positions due to pain in back and discomfort in abdomen (gas, per pt).        PT Education - 05/03/17 1603    Education provided Yes   Education Details HEP -   Person(s) Educated Patient   Methods Explanation;Handout;Verbal cues;Demonstration   Comprehension Verbalized understanding;Returned demonstration          PT Short Term Goals - 05/03/17 1621      PT SHORT TERM GOAL #1   Title I with initial HEP ( 05/23/17)    Time 4   Period Weeks   Status On-going     PT SHORT TERM GOAL #2   Title improve BERG =/> 48/56 to decrease fall risk ( 05/23/17)    Time 4   Period Weeks   Status On-going     PT SHORT TERM GOAL #3   Title improve bilat shoulder ROM to allow pt to hook her bra per her baseline ( 05/23/17)    Time 4   Period Weeks   Status On-going     PT SHORT TERM GOAL #4   Title improve FOTO =/< 38% limited ( 05/23/17)    Time 4   Period Weeks   Status On-going     PT SHORT TERM GOAL #5   Title improve cervical rotation =/< 60 degrees ( 05/23/17)    Time 4   Period Weeks   Status On-going           PT Long Term Goals - 05/03/17 1550      PT LONG TERM GOAL #1   Title I with HEP ( 06/20/17)    Time 8   Period Weeks   Status On-going     PT LONG TERM GOAL #2   Title improve FOTO =/<38 % limited, CJ level ( 06/20/17)    Time 8   Period Weeks   Status On-going     PT LONG TERM GOAL #3   Title report =/> 75% reduction in Lt UE pain with daily activities ( 06/20/17)    Time 8   Period Weeks   Status On-going     PT LONG TERM GOAL #4   Title maintain upright posture with head over shoulders and looking straight ahead without verbal cues ( 06/20/17)    Time 8   Period Weeks   Status On-going     PT LONG TERM GOAL #5   Title report =/> 75% improvement in her confidence with walking safely (  06/20/17)    Time 8    Period Weeks   Status On-going               Plan - 05/03/17 1620    Clinical Impression Statement Pt tolerated all exercises well, without increase in shoulder pain.  Pt continues with limited Lt shoulder ROM.  Exercises slightly modified to encourage compliance.  Pt progressing towards goals.    Rehab Potential Good   PT Frequency 2x / week   PT Duration 8 weeks   PT Treatment/Interventions Moist Heat;Ultrasound;Therapeutic exercise;Dry needling;Taping;Manual techniques;Neuromuscular re-education;DME Instruction;Cryotherapy;Electrical Stimulation;Iontophoresis /ml Dexamethasone;Patient/family education;Passive range of motion   PT Next Visit Plan postural work, back of the body strengthening and balance work.    Consulted and Agree with Plan of Care Patient      Patient will benefit from skilled therapeutic intervention in order to improve the following deficits and impairments:  Decreased range of motion, Difficulty walking, Pain, Increased muscle spasms, Decreased strength, Postural dysfunction, Improper body mechanics, Decreased balance, Decreased endurance  Visit Diagnosis: Acute pain of left shoulder  Muscle weakness (generalized)  Abnormal posture  Repeated falls     Problem List Patient Active Problem List   Diagnosis Date Noted  . Type 2 diabetes with nephropathy (HCC) 11/29/2015  . Chronic kidney disease (CKD) 11/29/2015  . Hx of bone density study 11/29/2015  . Postmenopausal 08/31/2015  . Chronic pain syndrome 06/23/2015  . Levoscoliosis 06/23/2015  . Retrolisthesis of vertebrae 06/23/2015  . Degenerative disc disease, lumbar 06/23/2015  . Herniation of intervertebral disc between L4 and L5 06/23/2015  . Type 2 diabetes mellitus (HCC) 06/02/2015  . Essential hypertension 06/02/2015  . Migraine 06/02/2015  . Seasonal allergies 06/02/2015  . History of vitamin D deficiency 06/02/2015  . Osteoporosis 06/02/2015  . GERD (gastroesophageal reflux  disease) 06/02/2015   Mayer Camel, PTA 05/03/17 4:24 PM  [facility] [place] [address] [place], [place], [address] Phone: [phone]   Fax:  [phone]  Name: Teresa Medina MRN: [id number] Date of Birth: 1945-08-14

## 2017-05-03 NOTE — Patient Instructions (Addendum)
Cane Exercise: Flexion    Lie on back, holding cane above chest. Keeping arms as straight as possible, lower cane toward floor beyond head. Hold __2-3__ seconds. Repeat _10___ times. Do __1__ sessions per day.  SHOULDER: External Rotation - Supine (Cane)    Hold cane with both hands. Rotate arm away from body. Keep elbow on floor and next to body. _10__ reps per set, __1_ sets per day, _5__ days per week Add towel to keep elbow at side.  Elbow Press    Interlace fingers; bring hands underneath head or over forehead. Press elbows down. Hold _5-15__ seconds. Relax arms. Repeat _5__ times.  [place]: Double Arm    Arms near sides, palms up. Press both arms lightly into floor, slide arms out to side and up alongside head. Keep contact with floor throughout motion. At maximal position, lengthen arms. Hold _a few__ seconds. Relax. Slide arms back to start. Repeat _10__ times.   [facility] at [facility] [address] [place], [place] [address]  [phone] (office) [phone] (fax)

## 2017-05-07 ENCOUNTER — Encounter: Payer: Medicare Other | Admitting: Physical Therapy

## 2017-05-10 ENCOUNTER — Ambulatory Visit (INDEPENDENT_AMBULATORY_CARE_PROVIDER_SITE_OTHER): Payer: Medicare Other | Admitting: Physical Therapy

## 2017-05-10 DIAGNOSIS — R293 Abnormal posture: Secondary | ICD-10-CM

## 2017-05-10 DIAGNOSIS — M6281 Muscle weakness (generalized): Secondary | ICD-10-CM | POA: Diagnosis not present

## 2017-05-10 DIAGNOSIS — R296 Repeated falls: Secondary | ICD-10-CM | POA: Diagnosis not present

## 2017-05-10 DIAGNOSIS — M25512 Pain in left shoulder: Secondary | ICD-10-CM | POA: Diagnosis present

## 2017-05-10 NOTE — Patient Instructions (Signed)
Axial Extension (Chin Tuck)    Pull chin in and lengthen back of neck. Hold __5__ seconds while counting out loud. Repeat __10__ times. Do __several__ sessions per day.  Shoulder Blade Squeeze    Rotate shoulders back, then squeeze shoulder blades together. Repeat ____ times. Do ____ sessions per day.  Upper Back Strength: Lower Trapezius / Rotator Cuff " L's "     Arms in waitress pose, palms up. Press hands back and slide shoulder blades down. Hold for __5__ seconds. Repeat _10___ times. 1-2 times per day.    Scapular Retraction: Elbow Flexion (Standing)  "W's"     With elbows bent to 90, pinch shoulder blades together and rotate arms out, keeping elbows bent. Repeat __10__ times per set. Do __1-2__ sets per session. Do _several ___ sessions per day.  [facility] at [facility] [address] [place], [place] [address]  [phone] (office) [phone] (fax)

## 2017-05-10 NOTE — Therapy (Signed)
[place], [place], [address] Phone: [phone]   Fax:  [phone]  Physical Therapy Treatment  Patient Details  Name: Teresa Medina MRN: [id number] Date of Birth: July 03, 1946 Referring Provider: Dr. Lynne Leader   Encounter Date: 05/10/2017      PT End of Session - 05/10/17 1446    Visit Number 3   Number of Visits 16   Date for PT Re-Evaluation 06/20/17   PT Start Time 5176   PT Stop Time 1530   PT Time Calculation (min) 44 min   Activity Tolerance Patient tolerated treatment well   Behavior During Therapy [facility] for tasks assessed/performed      Past Medical History:  Diagnosis Date  . Arthritis   . Chronic kidney disease (CKD) 11/29/2015   next visit: get UA, Phos, Vit D, PTH, CMP   . Chronic pain syndrome 06/23/2015   Records reviewed: pt has been on Fentanyl TD, Cymbalta, Flexeril  . Degenerative disc disease, lumbar 06/23/2015   MRI 06/2014 most severe L4-5  . Depression   . Diabetes mellitus without complication ([place])   . Essential hypertension 06/02/2015  . GERD (gastroesophageal reflux disease) 06/02/2015  . Herniation of intervertebral disc between L4 and L5 06/23/2015   MRI 06/2014 moderate to severe R foraminal narrowing  . History of vitamin D deficiency 06/02/2015  . Hypertension   . Levoscoliosis 06/23/2015   Mild, MRI 06/22/14  . Migraine 06/02/2015  . Osteoporosis 06/02/2015  . Retrolisthesis of vertebrae 06/23/2015   Gr1 L1 on L2 MRI 06/2014  . Seasonal allergies 06/02/2015  . Type 2 diabetes mellitus ([place]) 06/02/2015  . Type 2 diabetes with nephropathy ([place]) 11/29/2015   DIABETES SCREENING/PREVENTIVE CARE: Updated 03/05/16  A1C past 3-6 mos: Yes, 03/05/16 6.6% controlled? Yes BP goal <140/90: Yes  LDL goal <70: no - 100 11/24/15 Eye exam annually: Yes , importance discussed with patient Foot exam:  08/31/15 Microalbuminuria:   not applicable, patient is on ACE/ARB  Metformin: No - declines ACE/ARB: Yes  Antiplatelet if ASCVD Risk >10%: Recommended Statin: No     Past Surgical History:  Procedure Laterality Date  . BUNIONECTOMY Left 1993  . FOOT SURGERY  2008  . FOOT SURGERY  2003  . NECK SURGERY  1993  . REPLACEMENT TOTAL KNEE Bilateral 1999   R in Jan, L in Apr  . TONSILLECTOMY  1952    There were no vitals filed for this visit.      Subjective Assessment - 05/10/17 1447    Subjective "My back is really hurting today".  She has not had any fall since prior to last visit.  She feels her Lt shoulder pain has let up. Still has pain with reaching across body, or behind back.   She no longer has pain in her shoulder at night, and no shooting pains in shoulder when watching TV.    Pertinent History cervical surgery 25 yrs ago - pt not sure what they did.  L4-5 disc issues, osteoporosis, bilat TKA's 20 yrs ago. Pt reports bilat rotator cuffs were hurt 22 yrs ago and hasn't been throwing balls since.    Diagnostic tests x-rays ( -) for fractures.    Patient Stated Goals get back into using her arm again.    Currently in Pain? Yes   Pain Score 8    Pain Location Back   Pain Orientation Left;Right;Lower   Pain Descriptors / Indicators Spasm   Aggravating Factors  certain movements  Pain Relieving Factors ??            [facility] PT Assessment - 05/10/17 0001      Assessment   Medical Diagnosis Lt shoulder pain   Referring Provider Dr. Lynne Leader    Onset Date/Surgical Date 10/23/16   Hand Dominance Right   Next MD Visit PRN     AROM   Left Shoulder Extension 48 Degrees   Left Shoulder Flexion 140 Degrees   Left Shoulder ABduction 130 Degrees   Left Shoulder Internal Rotation --  Thumb to bra strap           [facility] Adult PT Treatment/Exercise - 05/10/17 0001      Lumbar Exercises: Standing   Other Standing Lumbar Exercises semi-tandem stance without UE support x 30 sec each leg;  Reaching / sliding arms up the outside of cabinet x 5  reps      Shoulder Exercises: Seated   Extension Strengthening;Both  8 reps   Theraband Level (Shoulder Extension) Level 1 (Yellow)   Row Strengthening;Both  8 reps   Theraband Level (Shoulder Row) Level 1 (Yellow)   External Rotation AAROM;Strengthening;Both;10 reps  cane, then band   Theraband Level (Shoulder External Rotation) Level 1 (Yellow)   Other Seated Exercises scap squeeze x 5 sec x 12 reps   Other Seated Exercises L's x 3 sec hold x 10; W's x 3 sec hold x 10 reps     Shoulder Exercises: Standing   Internal Rotation AAROM;Both;10 reps  cane behind back     Shoulder Exercises: Pulleys   Flexion --  5 min, to tolerance                PT Education - 05/10/17 1635    Education provided Yes   Education Details HEP   Person(s) Educated Patient   Methods Explanation;Handout;Verbal cues;Demonstration   Comprehension Verbalized understanding;Returned demonstration          PT Short Term Goals - 05/03/17 1621      PT SHORT TERM GOAL #1   Title I with initial HEP ( 05/23/17)    Time 4   Period Weeks   Status On-going     PT SHORT TERM GOAL #2   Title improve BERG =/> 48/56 to decrease fall risk ( 05/23/17)    Time 4   Period Weeks   Status On-going     PT SHORT TERM GOAL #3   Title improve bilat shoulder ROM to allow pt to hook her bra per her baseline ( 05/23/17)    Time 4   Period Weeks   Status On-going     PT SHORT TERM GOAL #4   Title improve FOTO =/< 38% limited ( 05/23/17)    Time 4   Period Weeks   Status On-going     PT SHORT TERM GOAL #5   Title improve cervical rotation =/< 60 degrees ( 05/23/17)    Time 4   Period Weeks   Status On-going           PT Long Term Goals - 05/10/17 1458      PT LONG TERM GOAL #1   Title I with HEP ( 06/20/17)    Time 8   Period Weeks   Status On-going     PT LONG TERM GOAL #2   Title improve FOTO =/<38 % limited, CJ level ( 06/20/17)    Time 8   Period Weeks   Status On-going  PT  LONG TERM GOAL #3   Title report =/> 75% reduction in Lt UE pain with daily activities ( 06/20/17)    Time 8   Period Weeks   Status Achieved     PT LONG TERM GOAL #4   Title maintain upright posture with head over shoulders and looking straight ahead without verbal cues ( 06/20/17)    Time 8   Period Weeks   Status On-going     PT LONG TERM GOAL #5   Title report =/> 75% improvement in her confidence with walking safely ( 06/20/17)    Time 8   Period Weeks   Status Partially Met  continues to be nervous with uneven ground, curbs, etc. outside               Plan - 05/10/17 1633    Clinical Impression Statement Pt demonstrated improved Lt shoulder ROM.  She is reporting less pain in shoulder; has met LTG#3.  Pt tolerated all exercises well, without increase in pain.  Progressing towards goals.    Rehab Potential Good   PT Frequency 2x / week   PT Duration 8 weeks   PT Treatment/Interventions Moist Heat;Ultrasound;Therapeutic exercise;Dry needling;Taping;Manual techniques;Neuromuscular re-education;DME Instruction;Cryotherapy;Electrical Stimulation;Iontophoresis 37m/ml Dexamethasone;Patient/family education;Passive range of motion   PT Next Visit Plan postural work, back of the body strengthening and balance work. Add band exercises for UE if tolerated.    Consulted and Agree with Plan of Care Patient      Patient will benefit from skilled therapeutic intervention in order to improve the following deficits and impairments:  Decreased range of motion, Difficulty walking, Pain, Increased muscle spasms, Decreased strength, Postural dysfunction, Improper body mechanics, Decreased balance, Decreased endurance  Visit Diagnosis: Acute pain of left shoulder  Muscle weakness (generalized)  Abnormal posture  Repeated falls     Problem List Patient Active Problem List   Diagnosis Date Noted  . Type 2 diabetes with nephropathy ([place] 11/29/2015  . Chronic kidney disease (CKD)  11/29/2015  . Hx of bone density study 11/29/2015  . Postmenopausal 08/31/2015  . Chronic pain syndrome 06/23/2015  . Levoscoliosis 06/23/2015  . Retrolisthesis of vertebrae 06/23/2015  . Degenerative disc disease, lumbar 06/23/2015  . Herniation of intervertebral disc between L4 and L5 06/23/2015  . Type 2 diabetes mellitus ([place] 06/02/2015  . Essential hypertension 06/02/2015  . Migraine 06/02/2015  . Seasonal allergies 06/02/2015  . History of vitamin D deficiency 06/02/2015  . Osteoporosis 06/02/2015  . GERD (gastroesophageal reflux disease) 06/02/2015   JKerin Perna PTA 05/10/17 4:37 PM  [place] [address]Phone: [phone]  Fax:  [phone] Name: MHlee FringerMRN: [id number] of Birth: 1Apr 19, 1947

## 2017-05-13 DIAGNOSIS — H04123 Dry eye syndrome of bilateral lacrimal glands: Secondary | ICD-10-CM | POA: Diagnosis not present

## 2017-05-13 DIAGNOSIS — H25813 Combined forms of age-related cataract, bilateral: Secondary | ICD-10-CM | POA: Diagnosis not present

## 2017-05-13 DIAGNOSIS — H43813 Vitreous degeneration, bilateral: Secondary | ICD-10-CM | POA: Diagnosis not present

## 2017-05-17 ENCOUNTER — Ambulatory Visit (INDEPENDENT_AMBULATORY_CARE_PROVIDER_SITE_OTHER): Payer: Medicare Other | Admitting: Physical Therapy

## 2017-05-17 DIAGNOSIS — R296 Repeated falls: Secondary | ICD-10-CM

## 2017-05-17 DIAGNOSIS — R293 Abnormal posture: Secondary | ICD-10-CM | POA: Diagnosis not present

## 2017-05-17 DIAGNOSIS — M25512 Pain in left shoulder: Secondary | ICD-10-CM

## 2017-05-17 DIAGNOSIS — M6281 Muscle weakness (generalized): Secondary | ICD-10-CM

## 2017-05-17 NOTE — Patient Instructions (Signed)
Resisted External Rotation: in Neutral - Bilateral  PALMS UP!!! Sit or stand, tubing in both hands, elbows at sides, bent to 90, forearms forward. Pinch shoulder blades together and rotate forearms out. Keep elbows at sides. Repeat _5-10__ times per set. Do __2-3__ sets per session. Do __3__ sessions per week.  Resistive Band Rowing   With resistive band anchored in door, grasp both ends. Keeping elbows bent, pull back, squeezing shoulder blades together. Hold _2___ seconds. Repeat _5-10___ times. Do __3__ sessions per week.    Strengthening: Resisted Extension   Hold tubing with both hands, arms forward. Pull arms back, elbow straight. Repeat _5-10__ times per set. Do __1-2__ sets per session. Do _1___ sessions per week.   [facility] [address] [place], [place] [address]  [phone] (office) [phone] (fax)

## 2017-05-17 NOTE — Therapy (Signed)
[place], [place], [address] Phone: [phone]   Fax:  [phone]  Physical Therapy Treatment  Patient Details  Name: Teresa Medina MRN: [id number] Date of Birth: Jan 13, 1946 Referring Provider: Dr. Lynne Leader   Encounter Date: 05/17/2017      PT End of Session - 05/17/17 1344    Visit Number 4   Number of Visits 16   Date for PT Re-Evaluation 06/20/17   PT Start Time 1332   PT Stop Time 1406   PT Time Calculation (min) 34 min   Activity Tolerance Patient tolerated treatment well   Behavior During Therapy [facility] for tasks assessed/performed      Past Medical History:  Diagnosis Date  . Arthritis   . Chronic kidney disease (CKD) 11/29/2015   next visit: get UA, Phos, Vit D, PTH, CMP   . Chronic pain syndrome 06/23/2015   Records reviewed: pt has been on Fentanyl TD, Cymbalta, Flexeril  . Degenerative disc disease, lumbar 06/23/2015   MRI 06/2014 most severe L4-5  . Depression   . Diabetes mellitus without complication ([place])   . Essential hypertension 06/02/2015  . GERD (gastroesophageal reflux disease) 06/02/2015  . Herniation of intervertebral disc between L4 and L5 06/23/2015   MRI 06/2014 moderate to severe R foraminal narrowing  . History of vitamin D deficiency 06/02/2015  . Hypertension   . Levoscoliosis 06/23/2015   Mild, MRI 06/22/14  . Migraine 06/02/2015  . Osteoporosis 06/02/2015  . Retrolisthesis of vertebrae 06/23/2015   Gr1 L1 on L2 MRI 06/2014  . Seasonal allergies 06/02/2015  . Type 2 diabetes mellitus ([place]) 06/02/2015  . Type 2 diabetes with nephropathy ([place]) 11/29/2015   DIABETES SCREENING/PREVENTIVE CARE: Updated 03/05/16  A1C past 3-6 mos: Yes, 03/05/16 6.6% controlled? Yes BP goal <140/90: Yes  LDL goal <70: no - 100 11/24/15 Eye exam annually: Yes , importance discussed with patient Foot exam:  08/31/15 Microalbuminuria:   not applicable, patient is on ACE/ARB  Metformin: No - declines ACE/ARB: Yes  Antiplatelet if ASCVD Risk >10%: Recommended Statin: No     Past Surgical History:  Procedure Laterality Date  . BUNIONECTOMY Left 1993  . FOOT SURGERY  2008  . FOOT SURGERY  2003  . NECK SURGERY  1993  . REPLACEMENT TOTAL KNEE Bilateral 1999   R in Jan, L in Apr  . TONSILLECTOMY  1952    There were no vitals filed for this visit.      Subjective Assessment - 05/17/17 1335    Subjective Pt reports her back is feeling better than last week. She feels like she is "3/4 better".  She'd like to work on some balance exercises today.    Patient Stated Goals get back into using her arm again.    Currently in Pain? Yes   Pain Score 2    Pain Location Back   Pain Orientation Right;Left;Lower   Pain Descriptors / Indicators Aching   Aggravating Factors  certain movements, bending over    Pain Relieving Factors ice            [facility] PT Assessment - 05/17/17 0001      Assessment   Medical Diagnosis Lt shoulder pain   Referring Provider Dr. Lynne Leader    Onset Date/Surgical Date 10/23/16   Hand Dominance Right   Next MD Visit PRN           [facility] Adult PT Treatment/Exercise - 05/17/17 0001  Elbow Exercises   Elbow Flexion Strengthening;Both;10 reps;Seated;Bar weights/barbell  1# in each hand. VC for posture/head position     Lumbar Exercises: Standing   Row Both;10 reps;Theraband   Theraband Level (Row) Level 1 (Yellow)   Shoulder Extension Strengthening;Both;10 reps;Theraband   Theraband Level (Shoulder Extension) Level 1 (Yellow)   Other Standing Lumbar Exercises semi-tandem stance without UE support x 30 sec each leg;  side stepping with increased step height x 10 ft Rt/Lt x 2 reps with light UE support on counter as needed.      Shoulder Exercises: Seated   Extension Strengthening;Both  8 reps   Theraband Level (Shoulder Extension) Level 1 (Yellow)   Extension Limitations VC for upright posture and technique   Row Both;10  reps;Theraband   Theraband Level (Shoulder Row) Level 1 (Yellow)   External Rotation Strengthening;Both;10 reps;Theraband  2 sets   Theraband Level (Shoulder External Rotation) Level 1 (Yellow)     Shoulder Exercises: Pulleys   Flexion 2 minutes   ABduction 2 minutes                PT Education - 05/17/17 1611    Education provided Yes   Education Details HEP - issued yellow band with cardboard handles (for decreased hand pain with exercise)   Person(s) Educated Patient   Methods Explanation;Handout;Verbal cues;Demonstration   Comprehension Verbalized understanding;Returned demonstration          PT Short Term Goals - 05/17/17 1615      PT SHORT TERM GOAL #1   Title I with initial HEP ( 05/23/17)    Time 4   Period Weeks   Status Achieved     PT SHORT TERM GOAL #2   Title improve BERG =/> 48/56 to decrease fall risk ( 05/23/17)    Time 4   Period Weeks   Status On-going     PT SHORT TERM GOAL #3   Title improve bilat shoulder ROM to allow pt to hook her bra per her baseline ( 05/23/17)    Time 4   Period Weeks   Status Achieved     PT SHORT TERM GOAL #4   Title improve FOTO =/< 38% limited ( 05/23/17)    Time 4   Period Weeks   Status On-going     PT SHORT TERM GOAL #5   Title improve cervical rotation =/< 60 degrees ( 05/23/17)    Time 4   Period Weeks           PT Long Term Goals - 05/17/17 1400      PT LONG TERM GOAL #1   Title I with HEP ( 06/20/17)    Time 8   Period Weeks   Status On-going     PT LONG TERM GOAL #2   Title improve FOTO =/<38 % limited, CJ level ( 06/20/17)    Time 8   Period Weeks   Status On-going     PT LONG TERM GOAL #3   Title report =/> 75% reduction in Lt UE pain with daily activities ( 06/20/17)    Time 8   Period Weeks   Status Achieved     PT LONG TERM GOAL #4   Title maintain upright posture with head over shoulders and looking straight ahead without verbal cues ( 06/20/17)    Time 8   Period Weeks    Status On-going     PT LONG TERM GOAL #5   Title report =/> 75% improvement in  her confidence with walking safely ( 06/20/17)    Time 8   Period Weeks   Status Partially Met  75% improvement on EVEN ground, cont to have decreased confidence with uneven ground/curbs/pavement.                Plan - 05/17/17 1413    Clinical Impression Statement Pt continues to show progress/improvement in confidence in gait as well as shoulder mobility.  She tolerated all exercises well, without increase in symtpoms.  Pt has met STG 1 and 3, is progressing well towards goals.    Rehab Potential Good   PT Frequency 2x / week   PT Duration 8 weeks   PT Treatment/Interventions Moist Heat;Ultrasound;Therapeutic exercise;Dry needling;Taping;Manual techniques;Neuromuscular re-education;DME Instruction;Cryotherapy;Electrical Stimulation;Iontophoresis 24m/ml Dexamethasone;Patient/family education;Passive range of motion   PT Next Visit Plan postural work, back of the body strengthening and balance work. measure cervical ROM. assess Berg balance test.    Consulted and Agree with Plan of Care Patient      Patient will benefit from skilled therapeutic intervention in order to improve the following deficits and impairments:  Decreased range of motion, Difficulty walking, Pain, Increased muscle spasms, Decreased strength, Postural dysfunction, Improper body mechanics, Decreased balance, Decreased endurance  Visit Diagnosis: Acute pain of left shoulder  Muscle weakness (generalized)  Abnormal posture  Repeated falls     Problem List Patient Active Problem List   Diagnosis Date Noted  . Type 2 diabetes with nephropathy ([place] 11/29/2015  . Chronic kidney disease (CKD) 11/29/2015  . Hx of bone density study 11/29/2015  . Postmenopausal 08/31/2015  . Chronic pain syndrome 06/23/2015  . Levoscoliosis 06/23/2015  . Retrolisthesis of vertebrae 06/23/2015  . Degenerative disc disease, lumbar 06/23/2015   . Herniation of intervertebral disc between L4 and L5 06/23/2015  . Type 2 diabetes mellitus ([place] 06/02/2015  . Essential hypertension 06/02/2015  . Migraine 06/02/2015  . Seasonal allergies 06/02/2015  . History of vitamin D deficiency 06/02/2015  . Osteoporosis 06/02/2015  . GERD (gastroesophageal reflux disease) 06/02/2015   JKerin Perna PTA 05/17/17 4:17 PM  [place] [address]Phone: [phone]  Fax:  [phone] Name: MKandis HenryMRN: [id number] of Birth: [birth year]

## 2017-05-24 ENCOUNTER — Encounter: Payer: Medicare Other | Admitting: Physical Therapy

## 2017-05-31 ENCOUNTER — Encounter: Payer: Medicare Other | Admitting: Physical Therapy

## 2017-06-07 ENCOUNTER — Ambulatory Visit (INDEPENDENT_AMBULATORY_CARE_PROVIDER_SITE_OTHER): Payer: Medicare Other | Admitting: Rehabilitative and Restorative Service Providers"

## 2017-06-07 ENCOUNTER — Encounter: Payer: Self-pay | Admitting: Rehabilitative and Restorative Service Providers"

## 2017-06-07 DIAGNOSIS — R293 Abnormal posture: Secondary | ICD-10-CM | POA: Diagnosis not present

## 2017-06-07 DIAGNOSIS — R296 Repeated falls: Secondary | ICD-10-CM | POA: Diagnosis not present

## 2017-06-07 DIAGNOSIS — M25512 Pain in left shoulder: Secondary | ICD-10-CM

## 2017-06-07 DIAGNOSIS — M6281 Muscle weakness (generalized): Secondary | ICD-10-CM | POA: Diagnosis not present

## 2017-06-07 NOTE — Therapy (Addendum)
[facility] [place], [place], [address] Phone: [phone]   Fax:  [phone]  Physical Therapy Treatment  Patient Details  Name: Teresa Medina MRN: [id number] Date of Birth: 09-20-1945 Referring Provider: Dr. Lynne Leader   Encounter Date: 06/07/2017      PT End of Session - 06/07/17 1334    Visit Number 5   Number of Visits 16   Date for PT Re-Evaluation 06/20/17   PT Start Time 0630   PT Stop Time 1404   PT Time Calculation (min) 31 min   Activity Tolerance Patient tolerated treatment well      Past Medical History:  Diagnosis Date  . Arthritis   . Chronic kidney disease (CKD) 11/29/2015   next visit: get UA, Phos, Vit D, PTH, CMP   . Chronic pain syndrome 06/23/2015   Records reviewed: pt has been on Fentanyl TD, Cymbalta, Flexeril  . Degenerative disc disease, lumbar 06/23/2015   MRI 06/2014 most severe L4-5  . Depression   . Diabetes mellitus without complication ([place])   . Essential hypertension 06/02/2015  . GERD (gastroesophageal reflux disease) 06/02/2015  . Herniation of intervertebral disc between L4 and L5 06/23/2015   MRI 06/2014 moderate to severe R foraminal narrowing  . History of vitamin D deficiency 06/02/2015  . Hypertension   . Levoscoliosis 06/23/2015   Mild, MRI 06/22/14  . Migraine 06/02/2015  . Osteoporosis 06/02/2015  . Retrolisthesis of vertebrae 06/23/2015   Gr1 L1 on L2 MRI 06/2014  . Seasonal allergies 06/02/2015  . Type 2 diabetes mellitus ([place]) 06/02/2015  . Type 2 diabetes with nephropathy ([place]) 11/29/2015   DIABETES SCREENING/PREVENTIVE CARE: Updated 03/05/16  A1C past 3-6 mos: Yes, 03/05/16 6.6% controlled? Yes BP goal <140/90: Yes  LDL goal <70: no - 100 11/24/15 Eye exam annually: Yes , importance discussed with patient Foot exam:  08/31/15 Microalbuminuria:   not applicable, patient is on ACE/ARB Metformin: No - declines ACE/ARB: Yes  Antiplatelet if ASCVD  Risk >10%: Recommended Statin: No     Past Surgical History:  Procedure Laterality Date  . BUNIONECTOMY Left 1993  . FOOT SURGERY  2008  . FOOT SURGERY  2003  . NECK SURGERY  1993  . REPLACEMENT TOTAL KNEE Bilateral 1999   R in Jan, L in Apr  . TONSILLECTOMY  1952    There were no vitals filed for this visit.      Subjective Assessment - 06/07/17 1338    Subjective Patient reports that she really wants to work on her balance and walking.    Currently in Pain? No/denies                         [facility] Adult PT Treatment/Exercise - 06/07/17 0001      Lumbar Exercises: Standing   Functional Squats 5 reps  stopped due to LBP    Other Standing Lumbar Exercises semi-tandem stance without UE support x 30 sec 2 reps each leg;  side stepping with increased step height x 10 ft Rt/Lt x 2 reps with light UE support on counter as needed.      Knee/Hip Exercises: Standing   Heel Raises 5 reps  difficult - unable to lift due to weakness    Hip Abduction Right;Left;10 reps;1 set  encouraging patient to lead with heel      Shoulder Exercises: Standing   Extension Strengthening;Both;20 reps;Theraband   Theraband Level (Shoulder Extension) Level 2 (Red)  Row Strengthening;Both;20 reps;Theraband   Theraband Level (Shoulder Row) Level 2 (Red)     Shoulder Exercises: Pulleys   Flexion 2 minutes   ABduction 2 minutes     Shoulder Exercises: ROM/Strengthening   Other ROM/Strengthening Exercises Nustep L5 x 5 min                   PT Short Term Goals - 05/17/17 1615      PT SHORT TERM GOAL #1   Title I with initial HEP ( 05/23/17)    Time 4   Period Weeks   Status Achieved     PT SHORT TERM GOAL #2   Title improve BERG =/> 48/56 to decrease fall risk ( 05/23/17)    Time 4   Period Weeks   Status On-going     PT SHORT TERM GOAL #3   Title improve bilat shoulder ROM to allow pt to hook her bra per her baseline ( 05/23/17)    Time 4   Period Weeks    Status Achieved     PT SHORT TERM GOAL #4   Title improve FOTO =/< 38% limited ( 05/23/17)    Time 4   Period Weeks   Status On-going     PT SHORT TERM GOAL #5   Title improve cervical rotation =/< 60 degrees ( 05/23/17)    Time 4   Period Weeks           PT Long Term Goals - 05/17/17 1400      PT LONG TERM GOAL #1   Title I with HEP ( 06/20/17)    Time 8   Period Weeks   Status On-going     PT LONG TERM GOAL #2   Title improve FOTO =/<38 % limited, CJ level ( 06/20/17)    Time 8   Period Weeks   Status On-going     PT LONG TERM GOAL #3   Title report =/> 75% reduction in Lt UE pain with daily activities ( 06/20/17)    Time 8   Period Weeks   Status Achieved     PT LONG TERM GOAL #4   Title maintain upright posture with head over shoulders and looking straight ahead without verbal cues ( 06/20/17)    Time 8   Period Weeks   Status On-going     PT LONG TERM GOAL #5   Title report =/> 75% improvement in her confidence with walking safely ( 06/20/17)    Time 8   Period Weeks   Status Partially Met  75% improvement on EVEN ground, cont to have decreased confidence with uneven ground/curbs/pavement.                Plan - 06/07/17 1401    Clinical Impression Statement Difficulty tolerating standing blanace activities due to back pain. Worked well with exercise in clinic.     Rehab Potential Good   PT Frequency 2x / week   PT Duration 8 weeks   PT Treatment/Interventions Moist Heat;Ultrasound;Therapeutic exercise;Dry needling;Taping;Manual techniques;Neuromuscular re-education;DME Instruction;Cryotherapy;Electrical Stimulation;Iontophoresis 81m/ml Dexamethasone;Patient/family education;Passive range of motion   PT Next Visit Plan postural work, back of the body strengthening and balance work. measure cervical ROM. assess Berg balance test.    Consulted and Agree with Plan of Care Patient      Patient will benefit from skilled therapeutic intervention in  order to improve the following deficits and impairments:  Decreased range of motion, Difficulty walking, Pain, Increased muscle spasms, Decreased  strength, Postural dysfunction, Improper body mechanics, Decreased balance, Decreased endurance  Visit Diagnosis: Acute pain of left shoulder  Muscle weakness (generalized)  Abnormal posture  Repeated falls     Problem List Patient Active Problem List   Diagnosis Date Noted  . Type 2 diabetes with nephropathy ([place]) 11/29/2015  . Chronic kidney disease (CKD) 11/29/2015  . Hx of bone density study 11/29/2015  . Postmenopausal 08/31/2015  . Chronic pain syndrome 06/23/2015  . Levoscoliosis 06/23/2015  . Retrolisthesis of vertebrae 06/23/2015  . Degenerative disc disease, lumbar 06/23/2015  . Herniation of intervertebral disc between L4 and L5 06/23/2015  . Type 2 diabetes mellitus ([place]) 06/02/2015  . Essential hypertension 06/02/2015  . Migraine 06/02/2015  . Seasonal allergies 06/02/2015  . History of vitamin D deficiency 06/02/2015  . Osteoporosis 06/02/2015  . GERD (gastroesophageal reflux disease) 06/02/2015    Jule Schlabach Nilda Simmer PT, MPH 06/07/2017, 2:04 PM  [facility], [place], [address] Phone: [phone]   Fax:  [phone]  Name: Teresa Medina MRN: [id number] Date of Birth: 1946/06/21  PHYSICAL THERAPY DISCHARGE SUMMARY  Visits from Start of Care: 5  Current functional level related to goals / functional outcomes: See last progress note for discharge status   Remaining deficits: Unknown    Education / Equipment: HEP Plan: Patient agrees to discharge.  Patient goals were partially met. Patient is being discharged due to not returning since the last visit.  ?????     Anjel Pardo P. Helene Kelp PT, MPH 07/22/17 3:45 PM

## 2017-06-11 ENCOUNTER — Other Ambulatory Visit: Payer: Self-pay

## 2017-06-11 DIAGNOSIS — Z8639 Personal history of other endocrine, nutritional and metabolic disease: Secondary | ICD-10-CM

## 2017-06-11 MED ORDER — SUMATRIPTAN SUCCINATE 100 MG PO TABS: ORAL_TABLET | ORAL | 3 refills | Status: DC

## 2017-06-11 MED ORDER — RABEPRAZOLE SODIUM 20 MG PO TBEC: 20.0000 mg | DELAYED_RELEASE_TABLET | Freq: Every day | ORAL | 3 refills | Status: DC

## 2017-06-11 MED ORDER — CYCLOBENZAPRINE HCL 10 MG PO TABS: 10.0000 mg | ORAL_TABLET | Freq: Three times a day (TID) | ORAL | 0 refills | Status: DC

## 2017-06-11 NOTE — Telephone Encounter (Signed)
Patient request refill for Imitrex, Flexeril, and Aciphex sent to [facility]. Rx was approved and sent to [facility]. Rhonda Cunningham,CMA

## 2017-06-13 ENCOUNTER — Encounter: Payer: Medicare Other | Admitting: Physical Therapy

## 2017-06-19 NOTE — Telephone Encounter (Signed)
Refills sent on the same day as message sent.

## 2017-06-20 ENCOUNTER — Encounter: Payer: Self-pay | Admitting: Osteopathic Medicine

## 2017-06-21 ENCOUNTER — Encounter: Payer: Medicare Other | Admitting: Physical Therapy

## 2017-06-28 ENCOUNTER — Telehealth: Payer: Self-pay | Admitting: Osteopathic Medicine

## 2017-06-28 NOTE — Telephone Encounter (Signed)
Patients son came in today stating Aciphex needed a prior authorization and mom has been out for a month. I did a PA and the medication is approved from 06/29/17 - 06/28/18. I called her pharmacy and they are going to send medication with her next shipment on the 29th. Patient's son was also notified. - CF

## 2017-07-01 ENCOUNTER — Ambulatory Visit (INDEPENDENT_AMBULATORY_CARE_PROVIDER_SITE_OTHER): Payer: Medicare Other | Admitting: Osteopathic Medicine

## 2017-07-01 ENCOUNTER — Encounter: Payer: Self-pay | Admitting: Osteopathic Medicine

## 2017-07-01 VITALS — BP 116/77 | HR 82 | Temp 98.3°F | Resp 16 | Wt 206.0 lb

## 2017-07-01 DIAGNOSIS — M5136 Other intervertebral disc degeneration, lumbar region: Secondary | ICD-10-CM | POA: Diagnosis not present

## 2017-07-01 DIAGNOSIS — M5126 Other intervertebral disc displacement, lumbar region: Secondary | ICD-10-CM

## 2017-07-01 DIAGNOSIS — E119 Type 2 diabetes mellitus without complications: Secondary | ICD-10-CM | POA: Diagnosis not present

## 2017-07-01 LAB — POCT GLYCOSYLATED HEMOGLOBIN (HGB A1C): HEMOGLOBIN A1C: 6.6

## 2017-07-01 MED ORDER — FENTANYL 50 MCG/HR TD PT72: 50.0000 ug | MEDICATED_PATCH | TRANSDERMAL | 0 refills | Status: DC

## 2017-07-01 NOTE — Progress Notes (Signed)
HPI: Teresa Medina is a 71 y.o. female  who [facility] today, 07/01/17,  for chief complaint of:  Chief Complaint  Patient presents with  . Follow-up    3 mths - DM  . Medication Refill    Fentanyl    Chronic pain: Patient is on fentanyl patches chronically, doing well on these. NCCSRS reviewed and no concerns - looks like at some point one of her patches got filled it late, she states one of her family members filled this for her, she asked him not to do this in the future.   Diabetes: A1C today looking good. Doing well with diabetic diet most of the time.     Past medical history, surgical history, social history and family history reviewed.  Patient Active Problem List   Diagnosis Date Noted  . Type 2 diabetes with nephropathy ([facility]) 11/29/2015  . Chronic kidney disease (CKD) 11/29/2015  . Hx of bone density study 11/29/2015  . Postmenopausal 08/31/2015  . Chronic pain syndrome 06/23/2015  . Levoscoliosis 06/23/2015  . Retrolisthesis of vertebrae 06/23/2015  . Degenerative disc disease, lumbar 06/23/2015  . Herniation of intervertebral disc between L4 and L5 06/23/2015  . Type 2 diabetes mellitus (H[facility]) 06/02/2015  . Essential hypertension 06/02/2015  . Migraine 06/02/2015  . Seasonal allergies 06/02/2015  . History of vitamin D deficiency 06/02/2015  . Osteoporosis 06/02/2015  . GERD (gastroesophageal reflux disease) 06/02/2015    Current medication list and allergy/intolerance information reviewed.   Current Outpatient Medications on File Prior to Visit  Medication Sig Dispense Refill  . AMBULATORY NON FORMULARY MEDICATION Medication Name: Zostavax IM x 1 vial 1 Units 0  . Calcium Carbonate-Vitamin D 600-400 MG-UNIT chew tablet Chew 2 tablets by mouth daily. 180 tablet 3  . Cholecalciferol (VITAMIN D-3) 1000 UNITS CAPS Take by mouth.    . cyclobenzaprine (FLEXERIL) 10 MG tablet Take 1 tablet (10 mg total) by mouth 3  (three) times daily. 90 tablet 0  . diclofenac sodium (VOLTAREN) 1 % GEL Apply 2-4 g topically 4 (four) times daily. To affected joint as needed 100 g 5  . DULoxetine (CYMBALTA) 30 MG capsule Take 3 capsules (90 mg total) by mouth daily. 270 capsule 1  . fentaNYL (DURAGESIC - DOSED MCG/HR) 50 MCG/HR Place 1 patch (50 mcg total) onto the skin every 3 (three) days. 10 patch 0  . fluticasone (FLONASE) 50 MCG/ACT nasal spray Place 1 spray into both nostrils daily. 48 g 3  . glucose blood test strip by Other route. Free Style Test Strips    . glyBURIDE (DIABETA) 2.5 MG tablet Take 1 tablet (2.5 mg total) by mouth daily with breakfast. 90 tablet 1  . losartan (COZAAR) 100 MG tablet Take 1 tablet (100 mg total) by mouth daily. 90 tablet 1  . metoprolol succinate (TOPROL-XL) 50 MG 24 hr tablet Take 1 tablet (50 mg total) by mouth daily. Take with or immediately following a meal. 90 tablet 1  . montelukast (SINGULAIR) 10 MG tablet Take 1 tablet (10 mg total) by mouth at bedtime. 90 tablet 3  . RABEprazole (ACIPHEX) 20 MG tablet Take 1 tablet (20 mg total) by mouth daily. 90 tablet 3  . SUMAtriptan (IMITREX) 100 MG tablet TAKE ONE TABLET BY MOUTH AS NEEDED FOR MIGRAINE. MAY REPEAT IN TWO HOURS IF HEADACHE PERSISTS OR RECURS. 30 tablet 3  . topiramate (TOPAMAX) 50 MG tablet Take 3 tablets (150 mg total) by mouth at bedtime. 270  tablet 3   No current facility-administered medications on file prior to visit.    No Known Allergies    Review of Systems:  Constitutional: No recent illness  Cardiac: No  chest pain, No  pressure  Respiratory:  No  shortness of breath. No  Cough  Neurologic: No  weakness, No  Dizziness  Psychiatric: No  concerns with depression, No  concerns with anxiety  Exam:  BP 116/77 (BP Location: Left Arm, Patient Position: Sitting, Cuff Size: Large)   Pulse 82   Temp 98.3 F (36.8 C) (Oral)   Resp 16   Wt 206 lb (93.4 kg)   BMI 38.92 kg/m   Constitutional: VS see above.  General Appearance: alert, well-developed, well-nourished, NAD  Eyes: Normal lids and conjunctive, non-icteric sclera  Ears, Nose, Mouth, Throat: MMM, Normal external inspection ears/nares/mouth/lips/gums.  Neck: No masses, trachea midline.   Respiratory: Normal respiratory effort. no wheeze, no rhonchi, no rales  Cardiovascular: S1/S2 normal, no murmur, no rub/gallop auscultated. RRR.   Neurological: Normal balance/coordination. No tremor.  Skin: warm, dry, intact.   Psychiatric: Normal judgment/insight. Normal mood and affect. Oriented x3.   Results for orders placed or performed in visit on 07/01/17 (from the past 48 hour(s))  POCT glycosylated hemoglobin (Hb A1C)     Status: Abnormal   Collection Time: 07/01/17  1:17 PM  Result Value Ref Range   Hemoglobin A1C 6.6      ASSESSMENT/PLAN:   Degenerative disc disease, lumbar - Plan: fentaNYL (DURAGESIC - DOSED MCG/HR) 50 MCG/HR, DISCONTINUED: fentaNYL (DURAGESIC - DOSED MCG/HR) 50 MCG/HR, DISCONTINUED: fentaNYL (DURAGESIC - DOSED MCG/HR) 50 MCG/HR  Type 2 diabetes mellitus without complication, without long-term current use of insulin (HCC) - Plan: POCT glycosylated hemoglobin (Hb A1C)  Herniation of intervertebral disc between L4 and L5 - Plan: fentaNYL (DURAGESIC - DOSED MCG/HR) 50 MCG/HR, DISCONTINUED: fentaNYL (DURAGESIC - DOSED MCG/HR) 50 MCG/HR, DISCONTINUED: fentaNYL (DURAGESIC - DOSED MCG/HR) 50 MCG/HR    Follow-up plan: Return in about 3 months (around 10/01/2017) for refill pain medications and check sugars, sooner if needed.  Visit summary with medication list and pertinent instructions was printed for patient to review, alert us if any changes needed. All questions at time of visit were answered - patient instructed to contact office with any additional concerns. ER/RTC precautions were reviewed with the patient and understanding verbalized.

## 2017-08-13 ENCOUNTER — Other Ambulatory Visit: Payer: Self-pay | Admitting: Osteopathic Medicine

## 2017-08-13 DIAGNOSIS — G894 Chronic pain syndrome: Secondary | ICD-10-CM

## 2017-09-03 DIAGNOSIS — E119 Type 2 diabetes mellitus without complications: Secondary | ICD-10-CM | POA: Diagnosis not present

## 2017-09-03 DIAGNOSIS — H0100B Unspecified blepharitis left eye, upper and lower eyelids: Secondary | ICD-10-CM | POA: Diagnosis not present

## 2017-09-03 DIAGNOSIS — H527 Unspecified disorder of refraction: Secondary | ICD-10-CM | POA: Diagnosis not present

## 2017-09-03 DIAGNOSIS — H0100A Unspecified blepharitis right eye, upper and lower eyelids: Secondary | ICD-10-CM | POA: Diagnosis not present

## 2017-09-03 DIAGNOSIS — H02834 Dermatochalasis of left upper eyelid: Secondary | ICD-10-CM | POA: Diagnosis not present

## 2017-09-03 DIAGNOSIS — H25813 Combined forms of age-related cataract, bilateral: Secondary | ICD-10-CM | POA: Diagnosis not present

## 2017-09-03 DIAGNOSIS — H02831 Dermatochalasis of right upper eyelid: Secondary | ICD-10-CM | POA: Diagnosis not present

## 2017-09-03 DIAGNOSIS — H04123 Dry eye syndrome of bilateral lacrimal glands: Secondary | ICD-10-CM | POA: Diagnosis not present

## 2017-09-03 DIAGNOSIS — H43813 Vitreous degeneration, bilateral: Secondary | ICD-10-CM | POA: Diagnosis not present

## 2017-09-03 LAB — HM DIABETES EYE EXAM

## 2017-09-06 ENCOUNTER — Encounter: Payer: Self-pay | Admitting: Osteopathic Medicine

## 2017-10-01 ENCOUNTER — Ambulatory Visit: Payer: Medicare Other | Admitting: Osteopathic Medicine

## 2017-10-07 ENCOUNTER — Ambulatory Visit: Payer: Medicare Other | Admitting: Osteopathic Medicine

## 2017-10-10 ENCOUNTER — Ambulatory Visit (INDEPENDENT_AMBULATORY_CARE_PROVIDER_SITE_OTHER): Payer: Medicare Other | Admitting: Osteopathic Medicine

## 2017-10-10 ENCOUNTER — Encounter: Payer: Self-pay | Admitting: Osteopathic Medicine

## 2017-10-10 VITALS — BP 133/56 | HR 80 | Temp 98.0°F | Wt 207.0 lb

## 2017-10-10 DIAGNOSIS — M5136 Other intervertebral disc degeneration, lumbar region: Secondary | ICD-10-CM

## 2017-10-10 DIAGNOSIS — M5126 Other intervertebral disc displacement, lumbar region: Secondary | ICD-10-CM | POA: Diagnosis not present

## 2017-10-10 DIAGNOSIS — G894 Chronic pain syndrome: Secondary | ICD-10-CM

## 2017-10-10 DIAGNOSIS — E1100 Type 2 diabetes mellitus with hyperosmolarity without nonketotic hyperglycemic-hyperosmolar coma (NKHHC): Secondary | ICD-10-CM | POA: Diagnosis not present

## 2017-10-10 LAB — POCT GLYCOSYLATED HEMOGLOBIN (HGB A1C): HEMOGLOBIN A1C: 6.1

## 2017-10-10 MED ORDER — FENTANYL 50 MCG/HR TD PT72: 50.0000 ug | MEDICATED_PATCH | TRANSDERMAL | 0 refills | Status: DC

## 2017-10-10 NOTE — Patient Instructions (Signed)
   [facility] around [facility]17.  When due for refill after that, can contact pharmacy and see if the will send us a request, if they won't do that, just call us!

## 2017-10-10 NOTE — Progress Notes (Signed)
HPI: Teresa Medina is a 72 y.o. female  who presents to [facility] today, 10/10/17,  for chief complaint of:  Pain medication refill, sugar recheck.   Chronic pain: Patient is on fentanyl patches chronically, doing well on these. NCCSRS reviewed and no concerns.  Diabetes: A1C today looking good. Doing well with diabetic diet most of the time.     Past medical history, surgical history, social history and family history reviewed.  Patient Active Problem List   Diagnosis Date Noted  . Type 2 diabetes with nephropathy (HCC) 11/29/2015  . Chronic kidney disease (CKD) 11/29/2015  . Hx of bone density study 11/29/2015  . Postmenopausal 08/31/2015  . Chronic pain syndrome 06/23/2015  . Levoscoliosis 06/23/2015  . Retrolisthesis of vertebrae 06/23/2015  . Degenerative disc disease, lumbar 06/23/2015  . Herniation of intervertebral disc between L4 and L5 06/23/2015  . Type 2 diabetes mellitus ([facility]) 06/02/2015  . Essential hypertension 06/02/2015  . Migraine 06/02/2015  . Seasonal allergies 06/02/2015  . History of vitamin D deficiency 06/02/2015  . Osteoporosis 06/02/2015  . GERD (gastroesophageal reflux disease) 06/02/2015    Current medication list and allergy/intolerance information reviewed.   Current Outpatient Medications on File Prior to Visit  Medication Sig Dispense Refill  . AMBULATORY NON FORMULARY MEDICATION Medication Name: Zostavax IM x 1 vial 1 Units 0  . Calcium Carbonate-Vitamin D 600-400 MG-UNIT chew tablet Chew 2 tablets by mouth daily. 180 tablet 3  . Cholecalciferol (VITAMIN D-3) 1000 UNITS CAPS Take by mouth.    . cyclobenzaprine (FLEXERIL) 10 MG tablet Take 1 tablet (10 mg total) by mouth 3 (three) times daily. 90 tablet 0  . diclofenac sodium (VOLTAREN) 1 % GEL Apply 2-4 g topically 4 (four) times daily. To affected joint as needed 100 g 5  . DULoxetine (CYMBALTA) 30 MG capsule Take 3 capsules (90 mg total) by mouth  daily. 270 capsule 0  . fluticasone (FLONASE) 50 MCG/ACT nasal spray Place 1 spray into both nostrils daily. 48 g 3  . glucose blood test strip by Other route. Free Style Test Strips    . glyBURIDE (DIABETA) 2.5 MG tablet Take 1 tablet (2.5 mg total) by mouth daily with breakfast. 90 tablet 1  . losartan (COZAAR) 100 MG tablet Take 1 tablet (100 mg total) by mouth daily. 90 tablet 1  . metoprolol succinate (TOPROL-XL) 50 MG 24 hr tablet Take 1 tablet (50 mg total) by mouth daily. Take with or immediately following a meal. 90 tablet 0  . montelukast (SINGULAIR) 10 MG tablet Take 1 tablet (10 mg total) by mouth at bedtime. 90 tablet 3  . RABEprazole (ACIPHEX) 20 MG tablet Take 1 tablet (20 mg total) by mouth daily. 90 tablet 3  . SUMAtriptan (IMITREX) 100 MG tablet TAKE ONE TABLET BY MOUTH AS NEEDED FOR MIGRAINE. MAY REPEAT IN TWO HOURS IF HEADACHE PERSISTS OR RECURS. 30 tablet 3  . topiramate (TOPAMAX) 50 MG tablet Take 3 tablets (150 mg total) by mouth at bedtime. 270 tablet 3   No current facility-administered medications on file prior to visit.    No Known Allergies    Review of Systems:  Constitutional: No recent illness  Cardiac: No  chest pain, No  pressure  Respiratory:  No  shortness of breath. No  Cough  Neurologic: No  weakness, No  Dizziness  Psychiatric: No  concerns with depression, No  concerns with anxiety  Exam:  BP (!) 133/56   Pulse 80  Temp 98 F (36.7 C) (Oral)   Wt 207 lb 0.6 oz (93.9 kg)   BMI 39.12 kg/m   Constitutional: VS see above. General Appearance: alert, well-developed, well-nourished, NAD  Eyes: Normal lids and conjunctive, non-icteric sclera  Ears, Nose, Mouth, Throat: MMM, Normal external inspection ears/nares/mouth/lips/gums.  Neck: No masses, trachea midline.   Respiratory: Normal respiratory effort. no wheeze, no rhonchi, no rales  Cardiovascular: S1/S2 normal, no murmur, no rub/gallop auscultated. RRR.   Neurological: Normal  balance/coordination. No tremor.  Skin: warm, dry, intact.   Psychiatric: Normal judgment/insight. Normal mood and affect. Oriented x3.   Results for orders placed or performed in visit on 10/10/17 (from the past 48 hour(s))  POCT HgB A1C     Status: None   Collection Time: 10/10/17  1:40 PM  Result Value Ref Range   Hemoglobin A1C 6.1      ASSESSMENT/PLAN:   Type 2 diabetes mellitus with hyperosmolarity without coma, without long-term current use of insulin (HCC) - Plan: POCT HgB A1C  Degenerative disc disease, lumbar  Herniation of intervertebral disc between L4 and L5  Chronic pain syndrome - 3 months of fentanyl patch prescriptions provided - Plan: fentaNYL (DURAGESIC - DOSED MCG/HR) 50 MCG/HR    Follow-up plan: Return for 3-4 months to recheck sugars and refill medicine, fasting visit for labs .  Visit summary with medication list and pertinent instructions was printed for patient to review, alert us if any changes needed. All questions at time of visit were answered - patient instructed to contact office with any additional concerns. ER/RTC precautions were reviewed with the patient and understanding verbalized.

## 2017-11-11 ENCOUNTER — Other Ambulatory Visit: Payer: Self-pay | Admitting: Osteopathic Medicine

## 2017-11-11 DIAGNOSIS — E119 Type 2 diabetes mellitus without complications: Secondary | ICD-10-CM

## 2017-11-11 DIAGNOSIS — G894 Chronic pain syndrome: Secondary | ICD-10-CM

## 2017-12-04 ENCOUNTER — Other Ambulatory Visit: Payer: Self-pay | Admitting: Osteopathic Medicine

## 2017-12-04 DIAGNOSIS — G894 Chronic pain syndrome: Secondary | ICD-10-CM

## 2017-12-04 NOTE — Telephone Encounter (Signed)
Pt advised OV needed.   Last OV 10-10-17  OV scheduled when on phone with pt today for 01-02-18 at 1:00 for med refills/management

## 2017-12-04 NOTE — Telephone Encounter (Signed)
Will refill now x30 days Per clinic policy for opiate medications, she'll need a follow up 3 months after her most recent visit for conitnued authorization of refills

## 2017-12-04 NOTE — Telephone Encounter (Signed)
[facility] requesting RF on pt's Fentanyl Patches.   Please review and send if appropriate

## 2017-12-13 ENCOUNTER — Encounter: Payer: Self-pay | Admitting: Osteopathic Medicine

## 2017-12-14 ENCOUNTER — Emergency Department (HOSPITAL_COMMUNITY)
Admission: EM | Admit: 2017-12-14 | Discharge: 2017-12-15 | Disposition: A | Discharge status: Home / Self Care | Payer: Medicare Other | Attending: Emergency Medicine | Admitting: Emergency Medicine

## 2017-12-14 ENCOUNTER — Emergency Department (HOSPITAL_COMMUNITY): Payer: Medicare Other

## 2017-12-14 ENCOUNTER — Other Ambulatory Visit: Payer: Self-pay

## 2017-12-14 ENCOUNTER — Encounter (HOSPITAL_COMMUNITY): Payer: Self-pay

## 2017-12-14 DIAGNOSIS — Y92009 Unspecified place in unspecified non-institutional (private) residence as the place of occurrence of the external cause: Secondary | ICD-10-CM | POA: Diagnosis not present

## 2017-12-14 DIAGNOSIS — N189 Chronic kidney disease, unspecified: Secondary | ICD-10-CM | POA: Diagnosis not present

## 2017-12-14 DIAGNOSIS — Z79899 Other long term (current) drug therapy: Secondary | ICD-10-CM | POA: Diagnosis not present

## 2017-12-14 DIAGNOSIS — Y9389 Activity, other specified: Secondary | ICD-10-CM | POA: Insufficient documentation

## 2017-12-14 DIAGNOSIS — I129 Hypertensive chronic kidney disease with stage 1 through stage 4 chronic kidney disease, or unspecified chronic kidney disease: Secondary | ICD-10-CM | POA: Insufficient documentation

## 2017-12-14 DIAGNOSIS — Y998 Other external cause status: Secondary | ICD-10-CM | POA: Insufficient documentation

## 2017-12-14 DIAGNOSIS — S0990XA Unspecified injury of head, initial encounter: Secondary | ICD-10-CM | POA: Insufficient documentation

## 2017-12-14 DIAGNOSIS — W19XXXA Unspecified fall, initial encounter: Secondary | ICD-10-CM

## 2017-12-14 DIAGNOSIS — Z7982 Long term (current) use of aspirin: Secondary | ICD-10-CM | POA: Insufficient documentation

## 2017-12-14 DIAGNOSIS — S42411A Displaced simple supracondylar fracture without intercondylar fracture of right humerus, initial encounter for closed fracture: Secondary | ICD-10-CM | POA: Diagnosis not present

## 2017-12-14 DIAGNOSIS — E1122 Type 2 diabetes mellitus with diabetic chronic kidney disease: Secondary | ICD-10-CM | POA: Diagnosis not present

## 2017-12-14 DIAGNOSIS — S199XXA Unspecified injury of neck, initial encounter: Secondary | ICD-10-CM | POA: Diagnosis not present

## 2017-12-14 DIAGNOSIS — W0110XA Fall on same level from slipping, tripping and stumbling with subsequent striking against unspecified object, initial encounter: Secondary | ICD-10-CM | POA: Diagnosis not present

## 2017-12-14 DIAGNOSIS — M79642 Pain in left hand: Secondary | ICD-10-CM | POA: Diagnosis not present

## 2017-12-14 DIAGNOSIS — S42413A Displaced simple supracondylar fracture without intercondylar fracture of unspecified humerus, initial encounter for closed fracture: Secondary | ICD-10-CM | POA: Diagnosis not present

## 2017-12-14 DIAGNOSIS — Z96653 Presence of artificial knee joint, bilateral: Secondary | ICD-10-CM | POA: Insufficient documentation

## 2017-12-14 DIAGNOSIS — T148XXA Other injury of unspecified body region, initial encounter: Secondary | ICD-10-CM | POA: Diagnosis not present

## 2017-12-14 DIAGNOSIS — S0003XA Contusion of scalp, initial encounter: Secondary | ICD-10-CM | POA: Diagnosis not present

## 2017-12-14 MED ORDER — ONDANSETRON HCL 4 MG/2ML IJ SOLN
4.0000 mg | Freq: Once | INTRAMUSCULAR | Status: AC
  Administered 2017-12-14: 4 mg via INTRAVENOUS
  Filled 2017-12-14: qty 2

## 2017-12-14 MED ORDER — HYDROMORPHONE HCL 1 MG/ML IJ SOLN
1.0000 mg | Freq: Once | INTRAMUSCULAR | Status: AC
  Administered 2017-12-14: 1 mg via INTRAVENOUS
  Filled 2017-12-14: qty 1

## 2017-12-14 MED ORDER — ONDANSETRON 4 MG PO TBDP: 4.0000 mg | ORAL_TABLET | Freq: Three times a day (TID) | ORAL | Status: DC | PRN

## 2017-12-14 MED ORDER — HYDROCODONE-ACETAMINOPHEN 5-325 MG PO TABS
1.0000 | ORAL_TABLET | Freq: Four times a day (QID) | ORAL | Status: DC | PRN
  Administered 2017-12-14 – 2017-12-15 (×2): 1 via ORAL
  Filled 2017-12-14 (×2): qty 1

## 2017-12-14 NOTE — ED Provider Notes (Addendum)
[facility]-EMERGENCY DEPT Provider Note   CSN: [id number] Arrival date & time: 12/14/17  1718     History   Chief Complaint Chief Complaint  Patient presents with  . Fall  . Elbow Injury    HPI Teresa Medina is a 72 y.o. female.  Patient stumbled over the rug and fell.  Landing forward hitting her right forehead and her right arm was trapped under her in unusual position.  Pain to the right elbow has a bruise to the right forehead no loss of consciousness.  Patient with some mild neck pain no significant pain to the neck or lower back.  Patient alert.  Patient without an orthopedic doctor locally.  Patient denied any other injuries.     Past Medical History:  Diagnosis Date  . Arthritis   . Chronic kidney disease (CKD) 11/29/2015   next visit: get UA, Phos, Vit D, PTH, CMP   . Chronic pain syndrome 06/23/2015   Records reviewed: pt has been on Fentanyl TD, Cymbalta, Flexeril  . Degenerative disc disease, lumbar 06/23/2015   MRI 06/2014 most severe L4-5  . Depression   . Diabetes mellitus without complication (HCC)   . Essential hypertension 06/02/2015  . GERD (gastroesophageal reflux disease) 06/02/2015  . Herniation of intervertebral disc between L4 and L5 06/23/2015   MRI 06/2014 moderate to severe R foraminal narrowing  . History of vitamin D deficiency 06/02/2015  . Hypertension   . Levoscoliosis 06/23/2015   Mild, MRI 06/22/14  . Migraine 06/02/2015  . Osteoporosis 06/02/2015  . Retrolisthesis of vertebrae 06/23/2015   Gr1 L1 on L2 MRI 06/2014  . Seasonal allergies 06/02/2015  . Type 2 diabetes mellitus (HCC) 06/02/2015  . Type 2 diabetes with nephropathy (HCC) 11/29/2015   DIABETES SCREENING/PREVENTIVE CARE: Updated 03/05/16  A1C past 3-6 mos: Yes, 03/05/16 6.6% controlled? Yes BP goal <140/90: Yes  LDL goal <70: no - 100 11/24/15 Eye exam annually: Yes , importance discussed with patient Foot exam:  08/31/15 Microalbuminuria:   not  applicable, patient is on ACE/ARB Metformin: No - declines ACE/ARB: Yes  Antiplatelet if ASCVD Risk >10%: Recommended Statin: No     Patient Active Problem List   Diagnosis Date Noted  . Type 2 diabetes with nephropathy (HCC) 11/29/2015  . Chronic kidney disease (CKD) 11/29/2015  . Hx of bone density study 11/29/2015  . Postmenopausal 08/31/2015  . Chronic pain syndrome 06/23/2015  . Levoscoliosis 06/23/2015  . Retrolisthesis of vertebrae 06/23/2015  . Degenerative disc disease, lumbar 06/23/2015  . Herniation of intervertebral disc between L4 and L5 06/23/2015  . Type 2 diabetes mellitus (HCC) 06/02/2015  . Essential hypertension 06/02/2015  . Migraine 06/02/2015  . Seasonal allergies 06/02/2015  . History of vitamin D deficiency 06/02/2015  . Osteoporosis 06/02/2015  . GERD (gastroesophageal reflux disease) 06/02/2015    Past Surgical History:  Procedure Laterality Date  . BUNIONECTOMY Left 1993  . FOOT SURGERY  2008  . FOOT SURGERY  2003  . NECK SURGERY  1993  . REPLACEMENT TOTAL KNEE Bilateral 1999   R in Jan, L in Apr  . TONSILLECTOMY  1952     OB History   None      Home Medications    Prior to Admission medications   Medication Sig Start Date End Date Taking? Authorizing Provider  aspirin EC 81 MG tablet Take 81 mg by mouth daily.   Yes [provider]  Biotin 11914 MCG TABS Take 10,000 mcg by mouth daily.  Yes [provider]  Cholecalciferol (VITAMIN D-3) 1000 UNITS CAPS Take 2,000 Units by mouth daily.    Yes [provider]  cyclobenzaprine (FLEXERIL) 10 MG tablet Take 1 tablet (10 mg total) by mouth 3 (three) times daily. 06/11/17  Yes Sunnie Nielsen, DO  DULoxetine (CYMBALTA) 30 MG capsule Take 3 capsules (90 mg total) by mouth daily. 11/11/17  Yes Sunnie Nielsen, DO  fentaNYL (DURAGESIC - DOSED MCG/HR) 50 MCG/HR Place 1 patch (50 mcg total) onto the skin every 3 (three) days. 12/04/17 01/03/18 Yes Alexander, Dorene Grebe, DO    fluticasone [facility]) 50 MCG/ACT nasal spray Place 1 spray into both nostrils daily. 03/08/17  Yes Sunnie Nielsen, DO  glucose blood test strip by Other route. Free Style Test Strips   Yes [provider]  glyBURIDE (DIABETA) 2.5 MG tablet Take 1 tablet (2.5 mg total) by mouth daily with breakfast. 11/11/17  Yes Sunnie Nielsen, DO  Homeopathic Products [facility] DRY EYE RELIEF OP) Apply 1 drop to eye 2 (two) times daily.   Yes [provider]  losartan (COZAAR) 100 MG tablet Take 1 tablet (100 mg total) by mouth daily. 03/08/17  Yes Sunnie Nielsen, DO  metoprolol succinate (TOPROL-XL) 50 MG 24 hr tablet Take 1 tablet (50 mg total) by mouth daily. Take with or immediately following a meal. 11/11/17  Yes Alexander, Dorene Grebe, DO  montelukast (SINGULAIR) 10 MG tablet Take 1 tablet (10 mg total) by mouth at bedtime. 03/08/17  Yes Sunnie Nielsen, DO  RABEprazole (ACIPHEX) 20 MG tablet Take 1 tablet (20 mg total) by mouth daily. 06/11/17  Yes Sunnie Nielsen, DO  SUMAtriptan (IMITREX) 100 MG tablet TAKE ONE TABLET BY MOUTH AS NEEDED FOR MIGRAINE. MAY REPEAT IN TWO HOURS IF HEADACHE PERSISTS OR RECURS. 06/11/17  Yes Sunnie Nielsen, DO  topiramate (TOPAMAX) 50 MG tablet Take 3 tablets (150 mg total) by mouth at bedtime. 03/08/17  Yes Sunnie Nielsen, DO    Family History Family History  Problem Relation Age of Onset  . Diabetes Sister   . Heart Problems Sister        multiple bypass surgeries  . Cancer Sister   . Depression Brother   . Heart Problems Brother        bad heart use oxygen  . Coronary artery disease Mother   . Migraines Mother   . Pulmonary fibrosis Father   . Pneumonia Father   . Migraines Father   . Breast cancer Maternal Aunt     Social History Social History   Tobacco Use  . Smoking status: Never Smoker  . Smokeless tobacco: Never Used  Substance Use Topics  . Alcohol use: No    Alcohol/week: 0.0 oz  . Drug use: No      Allergies   Patient has no known allergies.   Review of Systems Review of Systems  Constitutional: Negative for fever.  HENT: Negative for congestion.   Eyes: Negative for visual disturbance.  Respiratory: Negative for shortness of breath.   Cardiovascular: Negative for chest pain.  Gastrointestinal: Negative for abdominal pain.  Genitourinary: Negative for dysuria.  Musculoskeletal: Positive for neck pain. Negative for back pain.  Skin: Positive for wound.  Neurological: Negative for syncope, numbness and headaches.  Hematological: Does not bruise/bleed easily.  Psychiatric/Behavioral: Negative for confusion.     Physical Exam Updated Vital Signs BP (!) 121/101   Pulse 84   Resp 20   SpO2 96%   Physical Exam  Constitutional: She is oriented to person, place,  and time. She appears well-developed and well-nourished. No distress.  HENT:  Head: Normocephalic.   right forehead area with a bruise and hematoma measuring about 4 cm.  Superficial abrasion no active bleeding.  Eyes: Pupils are equal, round, and reactive to light. Conjunctivae and EOM are normal.  Neck: Normal range of motion. Neck supple.  No posterior tenderness to palpation to the midline cervical spine.  Pulmonary/Chest: Effort normal and breath sounds normal. No respiratory distress.  Abdominal: Soft. Bowel sounds are normal. She exhibits no distension.  Musculoskeletal: She exhibits edema and tenderness.  Patient with swelling around the right elbow area with some bruising no open wounds limited range of motion patient can extend elbow very well.  Distally radial pulses 2+ good cap refill sensation intact good movement of the fingers no tenderness at the wrist no tenderness at the shoulder area.  No other extremity injuries other than mentioned here.  Neurological: She is alert and oriented to person, place, and time. No cranial nerve deficit or sensory deficit. She exhibits normal muscle tone.  Coordination normal.  Skin: Skin is warm.  Nursing note and vitals reviewed.    ED Treatments / Results  Labs (all labs ordered are listed, but only abnormal results are displayed) Labs Reviewed - No data to display  EKG None  Radiology Dg Elbow Complete Right  Result Date: 12/14/2017 CLINICAL DATA:  Right elbow pain after fall today. EXAM: RIGHT ELBOW - COMPLETE 3+ VIEW COMPARISON:  None. FINDINGS: Moderately displaced distal humeral fracture is noted. Visualized portions of radius and ulna are unremarkable. IMPRESSION: Moderately displaced distal humeral fracture. Electronically Signed   By: Lupita Raider, M.D.   On: 12/14/2017 19:01   Ct Head Wo Contrast  Result Date: 12/14/2017 CLINICAL DATA:  Head injury after trauma. EXAM: CT HEAD WITHOUT CONTRAST CT CERVICAL SPINE WITHOUT CONTRAST TECHNIQUE: Multidetector CT imaging of the head and cervical spine was performed following the standard protocol without intravenous contrast. Multiplanar CT image reconstructions of the cervical spine were also generated. COMPARISON:  None. FINDINGS: CT HEAD FINDINGS Brain: No evidence of acute infarction, hemorrhage, hydrocephalus, extra-axial collection or mass lesion/mass effect. Vascular: No hyperdense vessel or unexpected calcification. Skull: Normal. Negative for fracture or focal lesion. Sinuses/Orbits: No acute finding. Other: Small right frontal scalp hematoma is noted. CT CERVICAL SPINE FINDINGS Alignment: Reversal of normal lordosis is noted due to extensive degenerative change. Skull base and vertebrae: Status post posterior laminectomy of C4, C5 and C6. No fracture or significant spondylolisthesis is noted. Soft tissues and spinal canal: No prevertebral fluid or swelling. No visible canal hematoma. Disc levels: There appears to be fusion of the C4-5 C5-6 and C6-7 disc spaces due to degenerative disc disease. Severe degenerative disc disease is also noted at C3-4 and C7-T1. Upper chest: Negative.  Other: None. IMPRESSION: Small right frontal scalp hematoma. No acute intracranial abnormality seen. Extensive degenerative and postsurgical changes are noted in the cervical spine. No acute fracture or spondylolisthesis is noted. Electronically Signed   By: Lupita Raider, M.D.   On: 12/14/2017 19:24   Ct Cervical Spine Wo Contrast  Result Date: 12/14/2017 CLINICAL DATA:  Head injury after trauma. EXAM: CT HEAD WITHOUT CONTRAST CT CERVICAL SPINE WITHOUT CONTRAST TECHNIQUE: Multidetector CT imaging of the head and cervical spine was performed following the standard protocol without intravenous contrast. Multiplanar CT image reconstructions of the cervical spine were also generated. COMPARISON:  None. FINDINGS: CT HEAD FINDINGS Brain: No evidence of acute infarction, hemorrhage, hydrocephalus,  extra-axial collection or mass lesion/mass effect. Vascular: No hyperdense vessel or unexpected calcification. Skull: Normal. Negative for fracture or focal lesion. Sinuses/Orbits: No acute finding. Other: Small right frontal scalp hematoma is noted. CT CERVICAL SPINE FINDINGS Alignment: Reversal of normal lordosis is noted due to extensive degenerative change. Skull base and vertebrae: Status post posterior laminectomy of C4, C5 and C6. No fracture or significant spondylolisthesis is noted. Soft tissues and spinal canal: No prevertebral fluid or swelling. No visible canal hematoma. Disc levels: There appears to be fusion of the C4-5 C5-6 and C6-7 disc spaces due to degenerative disc disease. Severe degenerative disc disease is also noted at C3-4 and C7-T1. Upper chest: Negative. Other: None. IMPRESSION: Small right frontal scalp hematoma. No acute intracranial abnormality seen. Extensive degenerative and postsurgical changes are noted in the cervical spine. No acute fracture or spondylolisthesis is noted. Electronically Signed   By: Lupita Raider, M.D.   On: 12/14/2017 19:24   Ct Elbow Right Wo Contrast  Result Date:  12/14/2017 CLINICAL DATA:  72 year old female with right humeral fracture. EXAM: CT OF THE LOWER RIGHT EXTREMITY WITHOUT CONTRAST TECHNIQUE: Multidetector CT imaging of the right lower extremity was performed according to the standard protocol. COMPARISON:  Right elbow radiograph dated 12/14/2017 FINDINGS: Evaluation is limited due to streak artifact and positioning of the arm. Bones/Joint/Cartilage There is a mildly displaced transverse fracture of the distal /supracondylar radius. There is mild volar angulation of the distal fracture fragment. There is probable extension of the fracture into the trochlea and medial epicondyle. A small bone fragment anterior to the trochlea at the tip of the coronoid process noted which may represent a fracture fragment from the true clear or medial epicondyle. There is no dislocation. Ligaments Suboptimally assessed by CT. Muscles and Tendons No acute findings.  No intramuscular hematoma. Soft tissues No large fluid collection or hematoma. IMPRESSION: Mildly displaced and angulated comminuted appearing fracture of the distal/supracondylar radius with apparent extension into the trochlea and possible involvement of the medial epicondyle. No dislocation. Electronically Signed   By: Elgie Collard M.D.   On: 12/14/2017 22:16    Procedures Procedures (including critical care time)  Medications Ordered in ED Medications  ondansetron (ZOFRAN-ODT) disintegrating tablet 4 mg (has no administration in time range)  HYDROcodone-acetaminophen (NORCO/VICODIN) 5-325 MG per tablet 1 tablet (1 tablet Oral Given 12/14/17 2254)  HYDROmorphone (DILAUDID) injection 1 mg (1 mg Intravenous Given 12/14/17 1931)  ondansetron (ZOFRAN) injection 4 mg (4 mg Intravenous Given 12/14/17 1931)     Initial Impression / Assessment and Plan / ED Course  I have reviewed the triage vital signs and the nursing notes.  Pertinent labs & imaging results that were available during my care of the patient were  reviewed by me and considered in my medical decision making (see chart for details).    Discussed with on-call orthopedist Dr.Xu,  He reviewed her x-rays.  Recommending a long-arm splint CT scan of the elbow results will have to be known he will followed up in the office.  Patient stable for discharge home and follow-up with orthopedics.  Patient will be given pain medication to take splint instructions.  And referral information to orthopedics.  Patient CT scan confirms findings on x-ray.  Patient will follow-up with Dr. Roda Shutters.  Patient placed in long-arm splint and sling.  At this point it became apparent that family was concerned about her going home.  Apparently there have been ambulatory problems for several months.  Patient is fallen  a few times.  So this just was not tripping over the carpet.  States that she does ambulate with a cane and at times uses a walker there is no wheelchair at home.  Do not want to send patient home if she would fall and hurt her self again.  Discussed with charge nurse.  Plan is to keep her here in the emergency department overnight though have care management evaluate her tomorrow.  If they can get things resolved for safe home environment for her then she could be admitted overnight for Sunday night and then could be evaluated by physical therapy occupational therapy on Monday.  Charge nurse is okay with this. Patient ordered hydrocodone and Zofran.  Consult placed for care management. Family okay with this plan.  No   Final Clinical Impressions(s) / ED Diagnoses   Final diagnoses:  Fall, initial encounter  Injury of head, initial encounter  Supracondylar fracture of humerus, closed, right, initial encounter    ED Discharge Orders    None       Vanetta Mulders, MD 12/14/17 2123    Vanetta Mulders, MD 12/15/17 0001

## 2017-12-14 NOTE — ED Notes (Signed)
Patient transported to CT 

## 2017-12-14 NOTE — ED Notes (Signed)
No respiratory or acute distress noted alert and oriented x 3 call light in reach. 

## 2017-12-14 NOTE — ED Triage Notes (Signed)
Pt resides at home. Mechanical witnessed fall today at 1530 resulting in right elbow pain with swelling and tenderness. EMS IV placed and 100 mcg Fentanyl given. Pt's personal patch removed before given. Pain currently at 6/10. No LOC SCCA CLEARED. No trauma. Pt reports no other complaints. Pt neuro intact. Alert and oriented x 4. Good PMS

## 2017-12-14 NOTE — ED Notes (Signed)
No respiratory or acute distress noted alert and oriented x 3 food and drink given call light in reach.

## 2017-12-15 DIAGNOSIS — S0990XA Unspecified injury of head, initial encounter: Secondary | ICD-10-CM | POA: Diagnosis not present

## 2017-12-15 MED ORDER — HYDROCODONE-ACETAMINOPHEN 5-325 MG PO TABS: 1.0000 | ORAL_TABLET | Freq: Four times a day (QID) | ORAL | 0 refills | Status: DC | PRN

## 2017-12-15 MED ORDER — HYDROCODONE-ACETAMINOPHEN 5-325 MG PO TABS
1.0000 | ORAL_TABLET | Freq: Once | ORAL | Status: AC
  Administered 2017-12-15: 1 via ORAL
  Filled 2017-12-15: qty 1

## 2017-12-15 NOTE — ED Notes (Signed)
No respiratory or acute distress noted resting in bed with eyes closed call light in reach no reaction to medication noted. 

## 2017-12-15 NOTE — ED Notes (Signed)
Per Dr. Jacqulyn Bath, patient may be given her fentanyl patch from home as well as her PRN Norco/Vicodin pill.

## 2017-12-15 NOTE — Evaluation (Addendum)
Physical Therapy Evaluation Patient Details Name: Teresa Medina MRN: [id number] DOB: July 10, 1946 Today's Date: 12/15/2017   History of Present Illness  72 y.o. female admitted with fall, hit head and injured R elbow. Imaging showed R distal radius and R distal humerus fractures. PMH of DM, CKD, cervical fusion, B TKAs.  Clinical Impression  Pt admitted with above diagnosis. Pt currently with functional limitations due to the deficits listed below (see PT Problem List). Min assist for stand pivot transfer, mod assist bed mobility. Standing tolerance limited by RUE pain and dizziness, pt unable to ambulate. ST-SNF recommended vs. HHPT if she doesn't qualify for SNF. Son stated he's not able to provide 24* assistance at home.  Pt will benefit from skilled PT to increase their independence and safety with mobility to allow discharge to the venue listed below.       Follow Up Recommendations SNF;Supervision/Assistance - 24 hour;Supervision for mobility/OOB(or HHPT if pt doesn't qualify for SNF)    Equipment Recommendations  Wheelchair (measurements PT);Wheelchair cushion (measurements PT) (pt plans to borrow a WC) ; 3 in 1   Recommendations for Other Services       Precautions / Restrictions Precautions Precautions: Fall Precaution Comments: 2 falls in past 1 year including fall just PTA Required Braces or Orthoses: Sling;Other Brace/Splint Other Brace/Splint: RUE splint Restrictions Weight Bearing Restrictions: Yes RUE Weight Bearing: Non weight bearing      Mobility  Bed Mobility Overal bed mobility: Needs Assistance Bed Mobility: Supine to Sit     Supine to sit: Mod assist     General bed mobility comments: mod A to raise trunk and to pivot hips to edge of bed  Transfers Overall transfer level: Needs assistance Equipment used: 1 person hand held assist Transfers: Sit to/from [employer] Sit to Stand: Min guard Stand pivot transfers: Min assist        General transfer comment: pt stood for ~ 2 minutes for pericare, standing tolerance limited by onset of dizziness so returned to sitting, then stood again for pivot to chair with min A for balance  Ambulation/Gait             General Gait Details: unable 2* dizziness/pain  Stairs            Wheelchair Mobility    Modified Rankin (Stroke Patients Only)       Balance Overall balance assessment: Needs assistance   Sitting balance-Leahy Scale: Fair     Standing balance support: Single extremity supported Standing balance-Leahy Scale: Poor                               Pertinent Vitals/Pain Pain Assessment: 0-10 Pain Score: 10-Worst pain ever Pain Location: R elbow with movement Pain Descriptors / Indicators: Sharp Pain Intervention(s): Limited activity within patient's tolerance;Monitored during session;Premedicated before session;Repositioned    Home Living Family/patient expects to be discharged to:: Private residence Living Arrangements: Children Available Help at Discharge: Family;Available PRN/intermittently Type of Home: House Home Access: Ramped entrance     Home Layout: One level Home Equipment: Cane - quad      Prior Function Level of Independence: Independent with assistive device(s)         Comments: walks with cane when going out     Hand Dominance        Extremity/Trunk Assessment   Upper Extremity Assessment Upper Extremity Assessment: RUE deficits/detail RUE Deficits / Details: can wiggle fingers RUE: Unable  to fully assess due to immobilization    Lower Extremity Assessment Lower Extremity Assessment: Overall WFL for tasks assessed(h/o peripheral neuropathy B feet)    Cervical / Trunk Assessment Cervical / Trunk Assessment: Normal  Communication   Communication: No difficulties  Cognition Arousal/Alertness: Awake/alert Behavior During Therapy: WFL for tasks assessed/performed Overall Cognitive Status:  Within Functional Limits for tasks assessed                                        General Comments      Exercises     Assessment/Plan    PT Assessment Patient needs continued PT services  PT Problem List Decreased strength;Decreased activity tolerance;Decreased balance;Decreased mobility;Pain       PT Treatment Interventions Gait training;DME instruction;Therapeutic activities;Therapeutic exercise;Functional mobility training;Balance training;Patient/family education    PT Goals (Current goals can be found in the Care Plan section)  Acute Rehab PT Goals Patient Stated Goal: wants to do a short rehab stay to get stronger PT Goal Formulation: With patient/family Time For Goal Achievement: 12/29/17 Potential to Achieve Goals: Good    Frequency Min 3X/week   Barriers to discharge Decreased caregiver support son works at home but not able to provide 24* assist    Co-evaluation               AM-PAC PT "6 Clicks" Daily Activity  Outcome Measure Difficulty turning over in bed (including adjusting bedclothes, sheets and blankets)?: Unable Difficulty moving from lying on back to sitting on the side of the bed? : Unable Difficulty sitting down on and standing up from a chair with arms (e.g., wheelchair, bedside commode, etc,.)?: A Lot Help needed moving to and from a bed to chair (including a wheelchair)?: A Little Help needed walking in hospital room?: Total Help needed climbing 3-5 steps with a railing? : Total 6 Click Score: 9    End of Session Equipment Utilized During Treatment: Gait belt;Other (comment)(RUE sling donned) Activity Tolerance: Patient limited by pain;Patient limited by fatigue Patient left: in chair;with call bell/phone within reach;with family/visitor present Nurse Communication: Mobility status PT Visit Diagnosis: Unsteadiness on feet (R26.81);History of falling (Z91.81);Difficulty in walking, not elsewhere classified  (R26.2);Pain Pain - Right/Left: Right Pain - part of body: Arm    Time: 1333-1416 PT Time Calculation (min) (ACUTE ONLY): 43 min   Charges:   PT Evaluation $PT Eval Low Complexity: 1 Low PT Treatments $Therapeutic Exercise: 23-37 mins   PT G Codes:         Tamala Ser 12/15/2017, 2:36 PM [phone]

## 2017-12-15 NOTE — Care Management Note (Signed)
Discharge Planning:  NCM spoke to pt and son, Minerva Areola at bedside. Pt had a fall at home and sustained injury to right elbow that will require surgery in next couple of weeks. She has cane and RW at home. Son plans to borrow a wheelchair and opt to keep benefit in case pt needs a scooter in near future. Offered choice for HH/list provided. Pt agreeable to [facility]. Pt declines rehab at this time but will plan decided after she has surgery. PT/OT ordered prior to dc from ED. Son wants instructions and to see how pt is ambulating prior to dc. Contacted [employer] rep with new referral. They will call son in am to arrange appt. Provided son with order for bedside commode with drop arm to take to retail store with [facility]. States they do not have in stock currently in hospital stock room. Isidoro Donning RN CCM Case Mgmt phone [phone]

## 2017-12-15 NOTE — ED Provider Notes (Signed)
Blood pressure 100/65, pulse 81, resp. rate 18, SpO2 91 %.  Assuming care from Dr. Deretha Emory.  In short, Teresa Medina is a 72 y.o. female with a chief complaint of Fall and Elbow Injury .  Refer to the original H&P for additional details.  The current plan of care is to f/u after SW evaluation.  02:47 PM I had a [name] discussion with the patient and her son.  We reviewed her injuries.  Patient has been seen by physical therapy.  Unfortunately, the patient is unable to be placed in a skilled nursing facility from the emergency department.  Home health has been set up and will begin services this week.  I provided the follow-up information for Dr. Roda Shutters with orthopedics.  Plan to prescribe a small amount of Norco which the patient has taken here for breakthrough pain.  After discussion the son is comfortable taking the patient home and will continue to pursue nursing placement as an outpatient.  Discussed fracture management and follow-up plan in detail.   Alona Bene, MD    Maia Plan, MD 12/15/17 [phone]

## 2017-12-15 NOTE — ED Notes (Signed)
Patient given breakfast tray.

## 2017-12-15 NOTE — Discharge Instructions (Signed)
You were seen in the ED today with an elbow fracture. You are having home health come to your house later this week. Have the social worker help you with additional placement options and call your insurance to discuss coverage options for skilled nursing care. Take Tramadol as needed for pain and keep the splint clean and dry. Call Dr. Roda Shutters tomorrow to schedule an outpatient follow up appointment to discuss treatment plan.

## 2017-12-15 NOTE — Progress Notes (Signed)
CSW informed by PT that her recommendation is "SNF if possible". PT requested CSW speak with patient/patient's son regarding SNF options.  CSW spoke with patient/patient's son at bedside regarding SNF placement process and medicare coverage. CSW explained that Medicare will only cover SNF if patient has a qualifying stay (inpatient for 3 midnights). Patient's son inquired about patient's [employer] covering for SNF. CSW explained that patient's Medicare is the primary insurance and the secondary would not cover SNF. CSW explained that the patient has the option to private pay for SNF. Patient's son expressed frustrations about patient's insurance not covering rehab at [facility]. Patient's son requested to speak with patient's MD regarding patient's treatment plan before making a discharge plan.  CSW notified patient's attending EDP, EDP agreed to follow up with patient/patient's son.  CSW notified by ED secretary that patient's son is requesting to speak with CSW.   CSW spoke with patient/patient's son regarding discharge planning. Patient's son inquired about the possibility of patient being discharged to SNF from the ED with private pay. CSW explained that it will depend on a SNF being able to accept patient today from the ED. Patient's son requested that CSW followup with [facility], [facility] and [facility].   CSW contacted [facility] admissions staff, no answer.  CSW contacted [facility] SN[facility], staff reported that CSW would have to contact admissions staff tomorrow to see if bed was available.  CSW contacted [facility], staff reported that they would not be able to admit a patient today and that CSW would have to follow up with admissions tomorrow.   CSW updated patient/patient's son.  Patient's son reported that he would take patient home. CSW provided SNF resources in case patient wants to pursue SNF from home.   CSW signing off, no other needs identified at this  time.  Celso Sickle, [place] Clinical Social Worker [facility] Cell#: [phone]

## 2017-12-16 ENCOUNTER — Other Ambulatory Visit: Payer: Self-pay | Admitting: Osteopathic Medicine

## 2017-12-16 ENCOUNTER — Ambulatory Visit (INDEPENDENT_AMBULATORY_CARE_PROVIDER_SITE_OTHER): Payer: Medicare Other | Admitting: Physician Assistant

## 2017-12-16 ENCOUNTER — Telehealth: Payer: Self-pay

## 2017-12-16 VITALS — BP 142/78 | HR 90 | Temp 98.0°F | Resp 18

## 2017-12-16 DIAGNOSIS — Z8639 Personal history of other endocrine, nutritional and metabolic disease: Secondary | ICD-10-CM

## 2017-12-16 DIAGNOSIS — Z23 Encounter for immunization: Secondary | ICD-10-CM | POA: Diagnosis not present

## 2017-12-16 DIAGNOSIS — S0083XD Contusion of other part of head, subsequent encounter: Secondary | ICD-10-CM | POA: Diagnosis not present

## 2017-12-16 DIAGNOSIS — S42401D Unspecified fracture of lower end of right humerus, subsequent encounter for fracture with routine healing: Secondary | ICD-10-CM | POA: Diagnosis not present

## 2017-12-16 MED ORDER — TOPIRAMATE 50 MG PO TABS: 150.0000 mg | ORAL_TABLET | Freq: Every day | ORAL | 3 refills | Status: DC

## 2017-12-16 MED ORDER — HYDROCODONE-ACETAMINOPHEN 5-325 MG PO TABS: 1.0000 | ORAL_TABLET | Freq: Four times a day (QID) | ORAL | 0 refills | Status: DC | PRN

## 2017-12-16 MED ORDER — CYCLOBENZAPRINE HCL 10 MG PO TABS: 10.0000 mg | ORAL_TABLET | Freq: Three times a day (TID) | ORAL | 0 refills | Status: DC

## 2017-12-16 NOTE — Progress Notes (Signed)
Patient is here via w/c; brought in by son; she broke right humerus and is wearing sling; has appt. With orthopedic surgeon 12-18-17 to decide on surgery; is going to rehab center for 24-7 care until that time and for post op recovery; needs PPD for admission.  Ok to fill out FL-2 forms as patient needs. 10 more tablets of norco to get her to ortho appt. Tandy Gaw PA-c

## 2017-12-16 NOTE — Telephone Encounter (Signed)
Yes, patient is being seen at the nurse visit for PPD and information collection for FL2.

## 2017-12-16 NOTE — Telephone Encounter (Signed)
Is this the patient you were talking about earlier. I don't mind sending home health for evaluation visit of needs.

## 2017-12-16 NOTE — Telephone Encounter (Signed)
Minerva Areola (pt's son) called stating that pt fell down at home on Saturday & broke her right arm. Pt was seen at [facility] & was kept overnight for observation. Minerva Areola is requesting recommendations to have her arm checked & states she needs add'l care. Requesting a call back from provider at [phone]. Pls advise, thanks.

## 2017-12-16 NOTE — Telephone Encounter (Signed)
Pt and Pt's son advised of refills for flexeril and Topamax that were sent.   Pt was given a short term supply of Hydrocodone to help with her with acute pain until she can get in with ortho. Pt has a right humerus fracture, evaluated at [facility] ED for evaluation. Pt see's ortho on Wednesday of this week for surgical evaluation. Pt is asking for a short term Rx to be sent to [facility] just to make sure she doesn't run out until she can get in with Ortho. Pt currently has 6 pills left, and has been taking q6hrs for pain.  Pt's son would like phone call [phone]) update with status of this request.

## 2017-12-17 ENCOUNTER — Other Ambulatory Visit: Payer: Self-pay | Admitting: *Deleted

## 2017-12-17 ENCOUNTER — Telehealth (INDEPENDENT_AMBULATORY_CARE_PROVIDER_SITE_OTHER): Payer: Self-pay | Admitting: Orthopaedic Surgery

## 2017-12-17 DIAGNOSIS — S0083XD Contusion of other part of head, subsequent encounter: Secondary | ICD-10-CM | POA: Diagnosis not present

## 2017-12-17 DIAGNOSIS — S42401D Unspecified fracture of lower end of right humerus, subsequent encounter for fracture with routine healing: Secondary | ICD-10-CM | POA: Diagnosis not present

## 2017-12-17 NOTE — Telephone Encounter (Signed)
I spoke with Jasmine December, who arranged [facility] to burn the cd of all images done on 12/14/17.  Patient's son Minerva Areola will pickup cd tonight for appointment tomorrow.

## 2017-12-17 NOTE — Consult Note (Signed)
Made aware by Kandee Keen with [facility] that patient enrolled in the [facility] First program over this past weekend.  Attempted to contact patient to make aware of [facility] Care Management's involvement if medication or transportation needs are identified while on the [facility] First program. However, there was no answer.   Will send notification to [facility] Care Management office to make aware of [facility] First enrollment.    Raiford Noble, MSN-Ed, RN,BSN [facility] Liaison [phone]

## 2017-12-17 NOTE — Telephone Encounter (Signed)
Patients son is requesting x-rays and scans of his moms right arm that were done at [facility] 12/14/17. They have an appt at [facility] tomorrow at 1 so they are needing to pick the disc up by 11 a.m. Tomorrow. Minerva Areola # [phone]  Or email [email or web address]

## 2017-12-18 ENCOUNTER — Ambulatory Visit (INDEPENDENT_AMBULATORY_CARE_PROVIDER_SITE_OTHER): Payer: Medicare Other | Admitting: Family Medicine

## 2017-12-18 ENCOUNTER — Other Ambulatory Visit: Payer: Self-pay

## 2017-12-18 VITALS — BP 150/92 | HR 69 | Temp 97.9°F | Resp 18

## 2017-12-18 DIAGNOSIS — M19021 Primary osteoarthritis, right elbow: Secondary | ICD-10-CM | POA: Diagnosis not present

## 2017-12-18 DIAGNOSIS — M8580 Other specified disorders of bone density and structure, unspecified site: Secondary | ICD-10-CM | POA: Diagnosis not present

## 2017-12-18 DIAGNOSIS — S52601D Unspecified fracture of lower end of right ulna, subsequent encounter for closed fracture with routine healing: Secondary | ICD-10-CM | POA: Diagnosis not present

## 2017-12-18 DIAGNOSIS — Z23 Encounter for immunization: Secondary | ICD-10-CM | POA: Diagnosis not present

## 2017-12-18 DIAGNOSIS — M25421 Effusion, right elbow: Secondary | ICD-10-CM | POA: Diagnosis not present

## 2017-12-18 DIAGNOSIS — M1811 Unilateral primary osteoarthritis of first carpometacarpal joint, right hand: Secondary | ICD-10-CM | POA: Diagnosis not present

## 2017-12-18 DIAGNOSIS — M25531 Pain in right wrist: Secondary | ICD-10-CM | POA: Diagnosis not present

## 2017-12-18 DIAGNOSIS — S42411A Displaced simple supracondylar fracture without intercondylar fracture of right humerus, initial encounter for closed fracture: Secondary | ICD-10-CM | POA: Diagnosis not present

## 2017-12-18 DIAGNOSIS — S42401A Unspecified fracture of lower end of right humerus, initial encounter for closed fracture: Secondary | ICD-10-CM | POA: Diagnosis not present

## 2017-12-18 DIAGNOSIS — S42411D Displaced simple supracondylar fracture without intercondylar fracture of right humerus, subsequent encounter for fracture with routine healing: Secondary | ICD-10-CM | POA: Diagnosis not present

## 2017-12-18 LAB — TB SKIN TEST
Induration: 0 mm
TB SKIN TEST: NEGATIVE

## 2017-12-18 NOTE — Progress Notes (Signed)
   Subjective:    Patient ID: Teresa Medina, female    DOB: 11/19/1945, 72 y.o.   MRN: [id number]  HPI Patient here for reading of PPD; VS and reading done while patient in automobile due to her discomfort and fatigue having come from orthopedic surgeon visit. She states she will have surgical repair of her fractured left arm on 12/23/17. She will go to rehab facility afterwards.   Review of Systems     Objective:   Physical Exam PPD site negative for redness; and 0 induration.       Assessment & Plan:  Paperwork given to patient FL2 Form.

## 2017-12-18 NOTE — Telephone Encounter (Signed)
Completed.

## 2017-12-19 DIAGNOSIS — S42401D Unspecified fracture of lower end of right humerus, subsequent encounter for fracture with routine healing: Secondary | ICD-10-CM | POA: Diagnosis not present

## 2017-12-19 DIAGNOSIS — S0083XD Contusion of other part of head, subsequent encounter: Secondary | ICD-10-CM | POA: Diagnosis not present

## 2017-12-19 DIAGNOSIS — S42409A Unspecified fracture of lower end of unspecified humerus, initial encounter for closed fracture: Secondary | ICD-10-CM | POA: Insufficient documentation

## 2017-12-19 LAB — READ PPD: TB SKIN TEST: NEGATIVE

## 2017-12-20 DIAGNOSIS — G4733 Obstructive sleep apnea (adult) (pediatric): Secondary | ICD-10-CM | POA: Diagnosis not present

## 2017-12-20 DIAGNOSIS — R52 Pain, unspecified: Secondary | ICD-10-CM | POA: Diagnosis not present

## 2017-12-20 DIAGNOSIS — F329 Major depressive disorder, single episode, unspecified: Secondary | ICD-10-CM | POA: Diagnosis not present

## 2017-12-20 DIAGNOSIS — R2681 Unsteadiness on feet: Secondary | ICD-10-CM | POA: Diagnosis not present

## 2017-12-20 DIAGNOSIS — Z6838 Body mass index (BMI) 38.0-38.9, adult: Secondary | ICD-10-CM | POA: Diagnosis not present

## 2017-12-20 DIAGNOSIS — I129 Hypertensive chronic kidney disease with stage 1 through stage 4 chronic kidney disease, or unspecified chronic kidney disease: Secondary | ICD-10-CM | POA: Diagnosis not present

## 2017-12-20 DIAGNOSIS — E1122 Type 2 diabetes mellitus with diabetic chronic kidney disease: Secondary | ICD-10-CM | POA: Diagnosis not present

## 2017-12-20 DIAGNOSIS — N189 Chronic kidney disease, unspecified: Secondary | ICD-10-CM | POA: Diagnosis not present

## 2017-12-20 DIAGNOSIS — J309 Allergic rhinitis, unspecified: Secondary | ICD-10-CM | POA: Diagnosis not present

## 2017-12-20 DIAGNOSIS — K219 Gastro-esophageal reflux disease without esophagitis: Secondary | ICD-10-CM | POA: Diagnosis not present

## 2017-12-20 DIAGNOSIS — R112 Nausea with vomiting, unspecified: Secondary | ICD-10-CM | POA: Diagnosis not present

## 2017-12-20 DIAGNOSIS — Z79899 Other long term (current) drug therapy: Secondary | ICD-10-CM | POA: Diagnosis not present

## 2017-12-20 DIAGNOSIS — G43909 Migraine, unspecified, not intractable, without status migrainosus: Secondary | ICD-10-CM | POA: Diagnosis not present

## 2017-12-20 DIAGNOSIS — I10 Essential (primary) hypertension: Secondary | ICD-10-CM | POA: Diagnosis not present

## 2017-12-20 DIAGNOSIS — K5900 Constipation, unspecified: Secondary | ICD-10-CM | POA: Diagnosis not present

## 2017-12-20 DIAGNOSIS — S42471A Displaced transcondylar fracture of right humerus, initial encounter for closed fracture: Secondary | ICD-10-CM | POA: Diagnosis not present

## 2017-12-20 DIAGNOSIS — E669 Obesity, unspecified: Secondary | ICD-10-CM | POA: Diagnosis not present

## 2017-12-20 DIAGNOSIS — M25531 Pain in right wrist: Secondary | ICD-10-CM | POA: Diagnosis not present

## 2017-12-20 DIAGNOSIS — Z7984 Long term (current) use of oral hypoglycemic drugs: Secondary | ICD-10-CM | POA: Diagnosis not present

## 2017-12-20 NOTE — Progress Notes (Signed)
Agree with documentation as above.   Marilea Gwynne, MD  

## 2017-12-24 DIAGNOSIS — S42401A Unspecified fracture of lower end of right humerus, initial encounter for closed fracture: Secondary | ICD-10-CM | POA: Diagnosis not present

## 2017-12-24 DIAGNOSIS — S42471A Displaced transcondylar fracture of right humerus, initial encounter for closed fracture: Secondary | ICD-10-CM | POA: Diagnosis not present

## 2017-12-24 DIAGNOSIS — I129 Hypertensive chronic kidney disease with stage 1 through stage 4 chronic kidney disease, or unspecified chronic kidney disease: Secondary | ICD-10-CM | POA: Diagnosis not present

## 2017-12-24 DIAGNOSIS — G8918 Other acute postprocedural pain: Secondary | ICD-10-CM | POA: Diagnosis not present

## 2017-12-24 DIAGNOSIS — K219 Gastro-esophageal reflux disease without esophagitis: Secondary | ICD-10-CM | POA: Diagnosis not present

## 2017-12-24 DIAGNOSIS — G4733 Obstructive sleep apnea (adult) (pediatric): Secondary | ICD-10-CM | POA: Diagnosis not present

## 2017-12-24 DIAGNOSIS — N189 Chronic kidney disease, unspecified: Secondary | ICD-10-CM | POA: Diagnosis not present

## 2017-12-24 DIAGNOSIS — E1122 Type 2 diabetes mellitus with diabetic chronic kidney disease: Secondary | ICD-10-CM | POA: Diagnosis not present

## 2017-12-25 DIAGNOSIS — N189 Chronic kidney disease, unspecified: Secondary | ICD-10-CM | POA: Diagnosis not present

## 2017-12-25 DIAGNOSIS — E1122 Type 2 diabetes mellitus with diabetic chronic kidney disease: Secondary | ICD-10-CM | POA: Diagnosis not present

## 2017-12-25 DIAGNOSIS — K219 Gastro-esophageal reflux disease without esophagitis: Secondary | ICD-10-CM | POA: Diagnosis not present

## 2017-12-25 DIAGNOSIS — S42471A Displaced transcondylar fracture of right humerus, initial encounter for closed fracture: Secondary | ICD-10-CM | POA: Diagnosis not present

## 2017-12-25 DIAGNOSIS — G4733 Obstructive sleep apnea (adult) (pediatric): Secondary | ICD-10-CM | POA: Diagnosis not present

## 2017-12-25 DIAGNOSIS — I129 Hypertensive chronic kidney disease with stage 1 through stage 4 chronic kidney disease, or unspecified chronic kidney disease: Secondary | ICD-10-CM | POA: Diagnosis not present

## 2018-01-02 ENCOUNTER — Encounter: Payer: Self-pay | Admitting: Osteopathic Medicine

## 2018-01-02 ENCOUNTER — Ambulatory Visit (INDEPENDENT_AMBULATORY_CARE_PROVIDER_SITE_OTHER): Payer: Medicare Other | Admitting: Osteopathic Medicine

## 2018-01-02 VITALS — BP 125/83 | HR 89 | Wt 198.6 lb

## 2018-01-02 DIAGNOSIS — G894 Chronic pain syndrome: Secondary | ICD-10-CM

## 2018-01-02 DIAGNOSIS — E1100 Type 2 diabetes mellitus with hyperosmolarity without nonketotic hyperglycemic-hyperosmolar coma (NKHHC): Secondary | ICD-10-CM

## 2018-01-02 DIAGNOSIS — M5136 Other intervertebral disc degeneration, lumbar region: Secondary | ICD-10-CM | POA: Diagnosis not present

## 2018-01-02 LAB — POCT GLYCOSYLATED HEMOGLOBIN (HGB A1C): HEMOGLOBIN A1C: 6.5 % — AB (ref 4.0–5.6)

## 2018-01-02 MED ORDER — FENTANYL 50 MCG/HR TD PT72: 50.0000 ug | MEDICATED_PATCH | TRANSDERMAL | 0 refills | Status: DC

## 2018-01-02 NOTE — Progress Notes (Signed)
HPI: Teresa Medina is a 72 y.o. female  who presents to [facility] today, 01/02/18,  for chief complaint of:  Pain medication refill, sugar recheck.   Chronic pain: Patient is on fentanyl patches chronically, doing well on these. NCCSRS reviewed and no concerns - she broke her arm and has been getting additional opiates from her orthopedic team w/ recent surgery   Diabetes: A1C today looking good. Doing well with diabetic diet most of the time, better now that she's in assisted living for rehab w/ arm. 6.5% A1C today.     Past medical history, surgical history, social history and family history reviewed.  Patient Active Problem List   Diagnosis Date Noted  . Closed fracture of distal end of humerus 12/19/2017  . Type 2 diabetes with nephropathy (HCC) 11/29/2015  . Chronic kidney disease (CKD) 11/29/2015  . Hx of bone density study 11/29/2015  . Postmenopausal 08/31/2015  . Chronic pain syndrome 06/23/2015  . Levoscoliosis 06/23/2015  . Retrolisthesis of vertebrae 06/23/2015  . Degenerative disc disease, lumbar 06/23/2015  . Herniation of intervertebral disc between L4 and L5 06/23/2015  . Type 2 diabetes mellitus (HCC) 06/02/2015  . Essential hypertension 06/02/2015  . Migraine 06/02/2015  . Seasonal allergies 06/02/2015  . History of vitamin D deficiency 06/02/2015  . Osteoporosis 06/02/2015  . GERD (gastroesophageal reflux disease) 06/02/2015    Current medication list and allergy/intolerance information reviewed.   Current Outpatient Medications on File Prior to Visit  Medication Sig Dispense Refill  . aspirin EC 81 MG tablet Take 81 mg by mouth daily.    . Biotin 16109 MCG TABS Take 10,000 mcg by mouth daily.    . Cholecalciferol (VITAMIN D-3) 1000 UNITS CAPS Take 2,000 Units by mouth daily.     . cyclobenzaprine (FLEXERIL) 10 MG tablet Take 1 tablet (10 mg total) by mouth 3 (three) times daily. 90 tablet 0  . DULoxetine (CYMBALTA)  30 MG capsule Take 3 capsules (90 mg total) by mouth daily. 270 capsule 0  . fentaNYL (DURAGESIC - DOSED MCG/HR) 50 MCG/HR Place 1 patch (50 mcg total) onto the skin every 3 (three) days. 10 patch 0  . fluticasone (FLONASE) 50 MCG/ACT nasal spray Place 1 spray into both nostrils daily. 48 g 3  . glucose blood test strip by Other route. Free Style Test Strips    . glyBURIDE (DIABETA) 2.5 MG tablet Take 1 tablet (2.5 mg total) by mouth daily with breakfast. 90 tablet 0  . Homeopathic Products (SIMILASAN DRY EYE RELIEF OP) Apply 1 drop to eye 2 (two) times daily.    Marland Kitchen HYDROcodone-acetaminophen (NORCO/VICODIN) 5-325 MG tablet Take 1 tablet by mouth every 6 (six) hours as needed for severe pain. 10 tablet 0  . losartan (COZAAR) 100 MG tablet Take 1 tablet (100 mg total) by mouth daily. 90 tablet 1  . metoprolol succinate (TOPROL-XL) 50 MG 24 hr tablet Take 1 tablet (50 mg total) by mouth daily. Take with or immediately following a meal. 90 tablet 0  . montelukast (SINGULAIR) 10 MG tablet Take 1 tablet (10 mg total) by mouth at bedtime. 90 tablet 3  . RABEprazole (ACIPHEX) 20 MG tablet Take 1 tablet (20 mg total) by mouth daily. 90 tablet 3  . SUMAtriptan (IMITREX) 100 MG tablet TAKE ONE TABLET BY MOUTH AS NEEDED FOR MIGRAINE. MAY REPEAT IN TWO HOURS IF HEADACHE PERSISTS OR RECURS. 30 tablet 3  . SUMAtriptan (IMITREX) 100 MG tablet TAKE ONE TABLET BY MOUTH  AS NEEDED FOR MIGRAINE. MAY REPEAT IN TWO HOURS IF HEADACHE PERSISTS OR RECURS. 30 tablet 0  . topiramate (TOPAMAX) 50 MG tablet Take 3 tablets (150 mg total) by mouth at bedtime. 270 tablet 3   No current facility-administered medications on file prior to visit.    No Known Allergies    Review of Systems:  Constitutional: No recent illness  Cardiac: No  chest pain, No  pressure  Respiratory:  No  shortness of breath. No  Cough  Neurologic: No  weakness, No  Dizziness  Psychiatric: No  concerns with depression, No  concerns with  anxiety  Exam:  BP 125/83 (BP Location: Left Arm, Patient Position: Sitting, Cuff Size: Large)   Pulse 89   Wt 198 lb 9.6 oz (90.1 kg)   BMI 37.53 kg/m   Constitutional: VS see above. General Appearance: alert, well-developed, well-nourished, NAD  Eyes: Normal lids and conjunctive, non-icteric sclera  Ears, Nose, Mouth, Throat: MMM, Normal external inspection ears/nares/mouth/lips/gums.  Neck: No masses, trachea midline.   Respiratory: Normal respiratory effort. no wheeze, no rhonchi, no rales  Cardiovascular: S1/S2 normal, no murmur, no rub/gallop auscultated. RRR.   Neurological: Normal balance/coordination. No tremor.  Skin: warm, dry, intact.   Psychiatric: Normal judgment/insight. Normal mood and affect. Oriented x3.   Results for orders placed or performed in visit on 01/02/18 (from the past 48 hour(s))  POCT HgB A1C     Status: Abnormal   Collection Time: 01/02/18  1:20 PM  Result Value Ref Range   Hemoglobin A1C 6.5 (A) 4.0 - 5.6 %   HbA1c, POC (prediabetic range)  5.7 - 6.4 %   HbA1c, POC (controlled diabetic range)  0.0 - 7.0 %     ASSESSMENT/PLAN:   Type 2 diabetes mellitus with hyperosmolarity without coma, without long-term current use of insulin (HCC) - Plan: POCT HgB A1C  Degenerative disc disease, lumbar  Chronic pain syndrome - 3 months of fentanyl patch prescriptions provided - Plan: fentaNYL (DURAGESIC - DOSED MCG/HR) 50 MCG/HR, DISCONTINUED: fentaNYL (DURAGESIC - DOSED MCG/HR) 50 MCG/HR, DISCONTINUED: fentaNYL (DURAGESIC - DOSED MCG/HR) 50 MCG/HR    Follow-up plan: Return in about 3 months (around 04/02/2018) for refill meds, A1C, sooner if needed (can cancel  01/30/18 appt) .  Visit summary with medication list and pertinent instructions was printed for patient to review, alert [place] if any changes needed. All questions at time of visit were answered - patient instructed to contact office with any additional concerns. ER/RTC precautions were reviewed  with the patient and understanding verbalized.

## 2018-01-05 DIAGNOSIS — N189 Chronic kidney disease, unspecified: Secondary | ICD-10-CM | POA: Diagnosis not present

## 2018-01-05 DIAGNOSIS — K219 Gastro-esophageal reflux disease without esophagitis: Secondary | ICD-10-CM | POA: Diagnosis not present

## 2018-01-05 DIAGNOSIS — E1100 Type 2 diabetes mellitus with hyperosmolarity without nonketotic hyperglycemic-hyperosmolar coma (NKHHC): Secondary | ICD-10-CM | POA: Diagnosis not present

## 2018-01-05 DIAGNOSIS — G894 Chronic pain syndrome: Secondary | ICD-10-CM | POA: Diagnosis not present

## 2018-01-05 DIAGNOSIS — M80021D Age-related osteoporosis with current pathological fracture, right humerus, subsequent encounter for fracture with routine healing: Secondary | ICD-10-CM | POA: Diagnosis not present

## 2018-01-05 DIAGNOSIS — I129 Hypertensive chronic kidney disease with stage 1 through stage 4 chronic kidney disease, or unspecified chronic kidney disease: Secondary | ICD-10-CM | POA: Diagnosis not present

## 2018-01-05 DIAGNOSIS — E1122 Type 2 diabetes mellitus with diabetic chronic kidney disease: Secondary | ICD-10-CM | POA: Diagnosis not present

## 2018-01-05 DIAGNOSIS — Z7984 Long term (current) use of oral hypoglycemic drugs: Secondary | ICD-10-CM | POA: Diagnosis not present

## 2018-01-05 DIAGNOSIS — M5136 Other intervertebral disc degeneration, lumbar region: Secondary | ICD-10-CM | POA: Diagnosis not present

## 2018-01-07 DIAGNOSIS — S42411D Displaced simple supracondylar fracture without intercondylar fracture of right humerus, subsequent encounter for fracture with routine healing: Secondary | ICD-10-CM | POA: Diagnosis not present

## 2018-01-07 DIAGNOSIS — S42491D Other displaced fracture of lower end of right humerus, subsequent encounter for fracture with routine healing: Secondary | ICD-10-CM | POA: Diagnosis not present

## 2018-01-08 DIAGNOSIS — M80021D Age-related osteoporosis with current pathological fracture, right humerus, subsequent encounter for fracture with routine healing: Secondary | ICD-10-CM | POA: Diagnosis not present

## 2018-01-08 DIAGNOSIS — M5136 Other intervertebral disc degeneration, lumbar region: Secondary | ICD-10-CM | POA: Diagnosis not present

## 2018-01-08 DIAGNOSIS — E1122 Type 2 diabetes mellitus with diabetic chronic kidney disease: Secondary | ICD-10-CM | POA: Diagnosis not present

## 2018-01-08 DIAGNOSIS — N189 Chronic kidney disease, unspecified: Secondary | ICD-10-CM | POA: Diagnosis not present

## 2018-01-08 DIAGNOSIS — I129 Hypertensive chronic kidney disease with stage 1 through stage 4 chronic kidney disease, or unspecified chronic kidney disease: Secondary | ICD-10-CM | POA: Diagnosis not present

## 2018-01-08 DIAGNOSIS — E1100 Type 2 diabetes mellitus with hyperosmolarity without nonketotic hyperglycemic-hyperosmolar coma (NKHHC): Secondary | ICD-10-CM | POA: Diagnosis not present

## 2018-01-09 DIAGNOSIS — N189 Chronic kidney disease, unspecified: Secondary | ICD-10-CM | POA: Diagnosis not present

## 2018-01-09 DIAGNOSIS — M80021D Age-related osteoporosis with current pathological fracture, right humerus, subsequent encounter for fracture with routine healing: Secondary | ICD-10-CM | POA: Diagnosis not present

## 2018-01-09 DIAGNOSIS — E1122 Type 2 diabetes mellitus with diabetic chronic kidney disease: Secondary | ICD-10-CM | POA: Diagnosis not present

## 2018-01-09 DIAGNOSIS — E1100 Type 2 diabetes mellitus with hyperosmolarity without nonketotic hyperglycemic-hyperosmolar coma (NKHHC): Secondary | ICD-10-CM | POA: Diagnosis not present

## 2018-01-09 DIAGNOSIS — I129 Hypertensive chronic kidney disease with stage 1 through stage 4 chronic kidney disease, or unspecified chronic kidney disease: Secondary | ICD-10-CM | POA: Diagnosis not present

## 2018-01-09 DIAGNOSIS — M5136 Other intervertebral disc degeneration, lumbar region: Secondary | ICD-10-CM | POA: Diagnosis not present

## 2018-01-14 DIAGNOSIS — E1122 Type 2 diabetes mellitus with diabetic chronic kidney disease: Secondary | ICD-10-CM | POA: Diagnosis not present

## 2018-01-14 DIAGNOSIS — M80021D Age-related osteoporosis with current pathological fracture, right humerus, subsequent encounter for fracture with routine healing: Secondary | ICD-10-CM | POA: Diagnosis not present

## 2018-01-14 DIAGNOSIS — E1100 Type 2 diabetes mellitus with hyperosmolarity without nonketotic hyperglycemic-hyperosmolar coma (NKHHC): Secondary | ICD-10-CM | POA: Diagnosis not present

## 2018-01-14 DIAGNOSIS — M5136 Other intervertebral disc degeneration, lumbar region: Secondary | ICD-10-CM | POA: Diagnosis not present

## 2018-01-14 DIAGNOSIS — I129 Hypertensive chronic kidney disease with stage 1 through stage 4 chronic kidney disease, or unspecified chronic kidney disease: Secondary | ICD-10-CM | POA: Diagnosis not present

## 2018-01-14 DIAGNOSIS — N189 Chronic kidney disease, unspecified: Secondary | ICD-10-CM | POA: Diagnosis not present

## 2018-01-17 DIAGNOSIS — I129 Hypertensive chronic kidney disease with stage 1 through stage 4 chronic kidney disease, or unspecified chronic kidney disease: Secondary | ICD-10-CM | POA: Diagnosis not present

## 2018-01-17 DIAGNOSIS — E1122 Type 2 diabetes mellitus with diabetic chronic kidney disease: Secondary | ICD-10-CM | POA: Diagnosis not present

## 2018-01-17 DIAGNOSIS — N189 Chronic kidney disease, unspecified: Secondary | ICD-10-CM | POA: Diagnosis not present

## 2018-01-17 DIAGNOSIS — E1100 Type 2 diabetes mellitus with hyperosmolarity without nonketotic hyperglycemic-hyperosmolar coma (NKHHC): Secondary | ICD-10-CM | POA: Diagnosis not present

## 2018-01-17 DIAGNOSIS — M80021D Age-related osteoporosis with current pathological fracture, right humerus, subsequent encounter for fracture with routine healing: Secondary | ICD-10-CM | POA: Diagnosis not present

## 2018-01-17 DIAGNOSIS — M5136 Other intervertebral disc degeneration, lumbar region: Secondary | ICD-10-CM | POA: Diagnosis not present

## 2018-01-20 DIAGNOSIS — E1122 Type 2 diabetes mellitus with diabetic chronic kidney disease: Secondary | ICD-10-CM | POA: Diagnosis not present

## 2018-01-20 DIAGNOSIS — N189 Chronic kidney disease, unspecified: Secondary | ICD-10-CM | POA: Diagnosis not present

## 2018-01-20 DIAGNOSIS — I129 Hypertensive chronic kidney disease with stage 1 through stage 4 chronic kidney disease, or unspecified chronic kidney disease: Secondary | ICD-10-CM | POA: Diagnosis not present

## 2018-01-20 DIAGNOSIS — M80021D Age-related osteoporosis with current pathological fracture, right humerus, subsequent encounter for fracture with routine healing: Secondary | ICD-10-CM | POA: Diagnosis not present

## 2018-01-20 DIAGNOSIS — E1100 Type 2 diabetes mellitus with hyperosmolarity without nonketotic hyperglycemic-hyperosmolar coma (NKHHC): Secondary | ICD-10-CM | POA: Diagnosis not present

## 2018-01-20 DIAGNOSIS — M5136 Other intervertebral disc degeneration, lumbar region: Secondary | ICD-10-CM | POA: Diagnosis not present

## 2018-01-21 DIAGNOSIS — I129 Hypertensive chronic kidney disease with stage 1 through stage 4 chronic kidney disease, or unspecified chronic kidney disease: Secondary | ICD-10-CM | POA: Diagnosis not present

## 2018-01-21 DIAGNOSIS — M80021D Age-related osteoporosis with current pathological fracture, right humerus, subsequent encounter for fracture with routine healing: Secondary | ICD-10-CM | POA: Diagnosis not present

## 2018-01-21 DIAGNOSIS — E1100 Type 2 diabetes mellitus with hyperosmolarity without nonketotic hyperglycemic-hyperosmolar coma (NKHHC): Secondary | ICD-10-CM | POA: Diagnosis not present

## 2018-01-21 DIAGNOSIS — E1122 Type 2 diabetes mellitus with diabetic chronic kidney disease: Secondary | ICD-10-CM | POA: Diagnosis not present

## 2018-01-21 DIAGNOSIS — N189 Chronic kidney disease, unspecified: Secondary | ICD-10-CM | POA: Diagnosis not present

## 2018-01-21 DIAGNOSIS — M5136 Other intervertebral disc degeneration, lumbar region: Secondary | ICD-10-CM | POA: Diagnosis not present

## 2018-01-22 DIAGNOSIS — M80021D Age-related osteoporosis with current pathological fracture, right humerus, subsequent encounter for fracture with routine healing: Secondary | ICD-10-CM | POA: Diagnosis not present

## 2018-01-22 DIAGNOSIS — I129 Hypertensive chronic kidney disease with stage 1 through stage 4 chronic kidney disease, or unspecified chronic kidney disease: Secondary | ICD-10-CM | POA: Diagnosis not present

## 2018-01-22 DIAGNOSIS — N189 Chronic kidney disease, unspecified: Secondary | ICD-10-CM | POA: Diagnosis not present

## 2018-01-22 DIAGNOSIS — M5136 Other intervertebral disc degeneration, lumbar region: Secondary | ICD-10-CM | POA: Diagnosis not present

## 2018-01-22 DIAGNOSIS — E1100 Type 2 diabetes mellitus with hyperosmolarity without nonketotic hyperglycemic-hyperosmolar coma (NKHHC): Secondary | ICD-10-CM | POA: Diagnosis not present

## 2018-01-22 DIAGNOSIS — E1122 Type 2 diabetes mellitus with diabetic chronic kidney disease: Secondary | ICD-10-CM | POA: Diagnosis not present

## 2018-01-23 DIAGNOSIS — M5136 Other intervertebral disc degeneration, lumbar region: Secondary | ICD-10-CM | POA: Diagnosis not present

## 2018-01-23 DIAGNOSIS — I129 Hypertensive chronic kidney disease with stage 1 through stage 4 chronic kidney disease, or unspecified chronic kidney disease: Secondary | ICD-10-CM | POA: Diagnosis not present

## 2018-01-23 DIAGNOSIS — N189 Chronic kidney disease, unspecified: Secondary | ICD-10-CM | POA: Diagnosis not present

## 2018-01-23 DIAGNOSIS — E1100 Type 2 diabetes mellitus with hyperosmolarity without nonketotic hyperglycemic-hyperosmolar coma (NKHHC): Secondary | ICD-10-CM | POA: Diagnosis not present

## 2018-01-23 DIAGNOSIS — M80021D Age-related osteoporosis with current pathological fracture, right humerus, subsequent encounter for fracture with routine healing: Secondary | ICD-10-CM | POA: Diagnosis not present

## 2018-01-23 DIAGNOSIS — E1122 Type 2 diabetes mellitus with diabetic chronic kidney disease: Secondary | ICD-10-CM | POA: Diagnosis not present

## 2018-01-24 DIAGNOSIS — I129 Hypertensive chronic kidney disease with stage 1 through stage 4 chronic kidney disease, or unspecified chronic kidney disease: Secondary | ICD-10-CM | POA: Diagnosis not present

## 2018-01-24 DIAGNOSIS — E1100 Type 2 diabetes mellitus with hyperosmolarity without nonketotic hyperglycemic-hyperosmolar coma (NKHHC): Secondary | ICD-10-CM | POA: Diagnosis not present

## 2018-01-24 DIAGNOSIS — E1122 Type 2 diabetes mellitus with diabetic chronic kidney disease: Secondary | ICD-10-CM | POA: Diagnosis not present

## 2018-01-24 DIAGNOSIS — N189 Chronic kidney disease, unspecified: Secondary | ICD-10-CM | POA: Diagnosis not present

## 2018-01-24 DIAGNOSIS — M5136 Other intervertebral disc degeneration, lumbar region: Secondary | ICD-10-CM | POA: Diagnosis not present

## 2018-01-24 DIAGNOSIS — M80021D Age-related osteoporosis with current pathological fracture, right humerus, subsequent encounter for fracture with routine healing: Secondary | ICD-10-CM | POA: Diagnosis not present

## 2018-01-30 ENCOUNTER — Ambulatory Visit: Payer: Medicare Other | Admitting: Osteopathic Medicine

## 2018-01-30 DIAGNOSIS — N189 Chronic kidney disease, unspecified: Secondary | ICD-10-CM | POA: Diagnosis not present

## 2018-01-30 DIAGNOSIS — M5136 Other intervertebral disc degeneration, lumbar region: Secondary | ICD-10-CM | POA: Diagnosis not present

## 2018-01-30 DIAGNOSIS — I129 Hypertensive chronic kidney disease with stage 1 through stage 4 chronic kidney disease, or unspecified chronic kidney disease: Secondary | ICD-10-CM | POA: Diagnosis not present

## 2018-01-30 DIAGNOSIS — E1100 Type 2 diabetes mellitus with hyperosmolarity without nonketotic hyperglycemic-hyperosmolar coma (NKHHC): Secondary | ICD-10-CM | POA: Diagnosis not present

## 2018-01-30 DIAGNOSIS — E1122 Type 2 diabetes mellitus with diabetic chronic kidney disease: Secondary | ICD-10-CM | POA: Diagnosis not present

## 2018-01-30 DIAGNOSIS — M80021D Age-related osteoporosis with current pathological fracture, right humerus, subsequent encounter for fracture with routine healing: Secondary | ICD-10-CM | POA: Diagnosis not present

## 2018-01-31 DIAGNOSIS — M5136 Other intervertebral disc degeneration, lumbar region: Secondary | ICD-10-CM | POA: Diagnosis not present

## 2018-01-31 DIAGNOSIS — N189 Chronic kidney disease, unspecified: Secondary | ICD-10-CM | POA: Diagnosis not present

## 2018-01-31 DIAGNOSIS — E1122 Type 2 diabetes mellitus with diabetic chronic kidney disease: Secondary | ICD-10-CM | POA: Diagnosis not present

## 2018-01-31 DIAGNOSIS — M80021D Age-related osteoporosis with current pathological fracture, right humerus, subsequent encounter for fracture with routine healing: Secondary | ICD-10-CM | POA: Diagnosis not present

## 2018-01-31 DIAGNOSIS — I129 Hypertensive chronic kidney disease with stage 1 through stage 4 chronic kidney disease, or unspecified chronic kidney disease: Secondary | ICD-10-CM | POA: Diagnosis not present

## 2018-01-31 DIAGNOSIS — E1100 Type 2 diabetes mellitus with hyperosmolarity without nonketotic hyperglycemic-hyperosmolar coma (NKHHC): Secondary | ICD-10-CM | POA: Diagnosis not present

## 2018-02-05 DIAGNOSIS — S42411D Displaced simple supracondylar fracture without intercondylar fracture of right humerus, subsequent encounter for fracture with routine healing: Secondary | ICD-10-CM | POA: Diagnosis not present

## 2018-02-06 DIAGNOSIS — N189 Chronic kidney disease, unspecified: Secondary | ICD-10-CM | POA: Diagnosis not present

## 2018-02-06 DIAGNOSIS — I129 Hypertensive chronic kidney disease with stage 1 through stage 4 chronic kidney disease, or unspecified chronic kidney disease: Secondary | ICD-10-CM | POA: Diagnosis not present

## 2018-02-06 DIAGNOSIS — M5136 Other intervertebral disc degeneration, lumbar region: Secondary | ICD-10-CM | POA: Diagnosis not present

## 2018-02-06 DIAGNOSIS — M80021D Age-related osteoporosis with current pathological fracture, right humerus, subsequent encounter for fracture with routine healing: Secondary | ICD-10-CM | POA: Diagnosis not present

## 2018-02-06 DIAGNOSIS — E1100 Type 2 diabetes mellitus with hyperosmolarity without nonketotic hyperglycemic-hyperosmolar coma (NKHHC): Secondary | ICD-10-CM | POA: Diagnosis not present

## 2018-02-06 DIAGNOSIS — E1122 Type 2 diabetes mellitus with diabetic chronic kidney disease: Secondary | ICD-10-CM | POA: Diagnosis not present

## 2018-02-07 DIAGNOSIS — E1122 Type 2 diabetes mellitus with diabetic chronic kidney disease: Secondary | ICD-10-CM | POA: Diagnosis not present

## 2018-02-07 DIAGNOSIS — M80021D Age-related osteoporosis with current pathological fracture, right humerus, subsequent encounter for fracture with routine healing: Secondary | ICD-10-CM | POA: Diagnosis not present

## 2018-02-07 DIAGNOSIS — M5136 Other intervertebral disc degeneration, lumbar region: Secondary | ICD-10-CM | POA: Diagnosis not present

## 2018-02-07 DIAGNOSIS — I129 Hypertensive chronic kidney disease with stage 1 through stage 4 chronic kidney disease, or unspecified chronic kidney disease: Secondary | ICD-10-CM | POA: Diagnosis not present

## 2018-02-07 DIAGNOSIS — E1100 Type 2 diabetes mellitus with hyperosmolarity without nonketotic hyperglycemic-hyperosmolar coma (NKHHC): Secondary | ICD-10-CM | POA: Diagnosis not present

## 2018-02-07 DIAGNOSIS — N189 Chronic kidney disease, unspecified: Secondary | ICD-10-CM | POA: Diagnosis not present

## 2018-02-08 DIAGNOSIS — M5136 Other intervertebral disc degeneration, lumbar region: Secondary | ICD-10-CM | POA: Diagnosis not present

## 2018-02-08 DIAGNOSIS — I129 Hypertensive chronic kidney disease with stage 1 through stage 4 chronic kidney disease, or unspecified chronic kidney disease: Secondary | ICD-10-CM | POA: Diagnosis not present

## 2018-02-08 DIAGNOSIS — E1122 Type 2 diabetes mellitus with diabetic chronic kidney disease: Secondary | ICD-10-CM | POA: Diagnosis not present

## 2018-02-08 DIAGNOSIS — M80021D Age-related osteoporosis with current pathological fracture, right humerus, subsequent encounter for fracture with routine healing: Secondary | ICD-10-CM | POA: Diagnosis not present

## 2018-02-08 DIAGNOSIS — N189 Chronic kidney disease, unspecified: Secondary | ICD-10-CM | POA: Diagnosis not present

## 2018-02-08 DIAGNOSIS — E1100 Type 2 diabetes mellitus with hyperosmolarity without nonketotic hyperglycemic-hyperosmolar coma (NKHHC): Secondary | ICD-10-CM | POA: Diagnosis not present

## 2018-02-09 ENCOUNTER — Other Ambulatory Visit: Payer: Self-pay | Admitting: Osteopathic Medicine

## 2018-02-09 DIAGNOSIS — I10 Essential (primary) hypertension: Secondary | ICD-10-CM

## 2018-02-09 DIAGNOSIS — E119 Type 2 diabetes mellitus without complications: Secondary | ICD-10-CM

## 2018-02-09 DIAGNOSIS — G894 Chronic pain syndrome: Secondary | ICD-10-CM

## 2018-02-10 DIAGNOSIS — E1100 Type 2 diabetes mellitus with hyperosmolarity without nonketotic hyperglycemic-hyperosmolar coma (NKHHC): Secondary | ICD-10-CM | POA: Diagnosis not present

## 2018-02-10 DIAGNOSIS — M5136 Other intervertebral disc degeneration, lumbar region: Secondary | ICD-10-CM | POA: Diagnosis not present

## 2018-02-10 DIAGNOSIS — E1122 Type 2 diabetes mellitus with diabetic chronic kidney disease: Secondary | ICD-10-CM | POA: Diagnosis not present

## 2018-02-10 DIAGNOSIS — M80021D Age-related osteoporosis with current pathological fracture, right humerus, subsequent encounter for fracture with routine healing: Secondary | ICD-10-CM | POA: Diagnosis not present

## 2018-02-10 DIAGNOSIS — N189 Chronic kidney disease, unspecified: Secondary | ICD-10-CM | POA: Diagnosis not present

## 2018-02-10 DIAGNOSIS — I129 Hypertensive chronic kidney disease with stage 1 through stage 4 chronic kidney disease, or unspecified chronic kidney disease: Secondary | ICD-10-CM | POA: Diagnosis not present

## 2018-02-11 DIAGNOSIS — E1100 Type 2 diabetes mellitus with hyperosmolarity without nonketotic hyperglycemic-hyperosmolar coma (NKHHC): Secondary | ICD-10-CM | POA: Diagnosis not present

## 2018-02-11 DIAGNOSIS — M80021D Age-related osteoporosis with current pathological fracture, right humerus, subsequent encounter for fracture with routine healing: Secondary | ICD-10-CM | POA: Diagnosis not present

## 2018-02-11 DIAGNOSIS — E1122 Type 2 diabetes mellitus with diabetic chronic kidney disease: Secondary | ICD-10-CM | POA: Diagnosis not present

## 2018-02-11 DIAGNOSIS — M5136 Other intervertebral disc degeneration, lumbar region: Secondary | ICD-10-CM | POA: Diagnosis not present

## 2018-02-11 DIAGNOSIS — I129 Hypertensive chronic kidney disease with stage 1 through stage 4 chronic kidney disease, or unspecified chronic kidney disease: Secondary | ICD-10-CM | POA: Diagnosis not present

## 2018-02-11 DIAGNOSIS — N189 Chronic kidney disease, unspecified: Secondary | ICD-10-CM | POA: Diagnosis not present

## 2018-02-12 ENCOUNTER — Other Ambulatory Visit: Payer: Self-pay | Admitting: Osteopathic Medicine

## 2018-02-12 DIAGNOSIS — Z780 Asymptomatic menopausal state: Secondary | ICD-10-CM

## 2018-02-18 DIAGNOSIS — M80021D Age-related osteoporosis with current pathological fracture, right humerus, subsequent encounter for fracture with routine healing: Secondary | ICD-10-CM | POA: Diagnosis not present

## 2018-02-18 DIAGNOSIS — I129 Hypertensive chronic kidney disease with stage 1 through stage 4 chronic kidney disease, or unspecified chronic kidney disease: Secondary | ICD-10-CM | POA: Diagnosis not present

## 2018-02-18 DIAGNOSIS — M5136 Other intervertebral disc degeneration, lumbar region: Secondary | ICD-10-CM | POA: Diagnosis not present

## 2018-02-18 DIAGNOSIS — E1100 Type 2 diabetes mellitus with hyperosmolarity without nonketotic hyperglycemic-hyperosmolar coma (NKHHC): Secondary | ICD-10-CM | POA: Diagnosis not present

## 2018-02-18 DIAGNOSIS — N189 Chronic kidney disease, unspecified: Secondary | ICD-10-CM | POA: Diagnosis not present

## 2018-02-18 DIAGNOSIS — E1122 Type 2 diabetes mellitus with diabetic chronic kidney disease: Secondary | ICD-10-CM | POA: Diagnosis not present

## 2018-02-20 DIAGNOSIS — I129 Hypertensive chronic kidney disease with stage 1 through stage 4 chronic kidney disease, or unspecified chronic kidney disease: Secondary | ICD-10-CM | POA: Diagnosis not present

## 2018-02-20 DIAGNOSIS — E1122 Type 2 diabetes mellitus with diabetic chronic kidney disease: Secondary | ICD-10-CM | POA: Diagnosis not present

## 2018-02-20 DIAGNOSIS — E1100 Type 2 diabetes mellitus with hyperosmolarity without nonketotic hyperglycemic-hyperosmolar coma (NKHHC): Secondary | ICD-10-CM | POA: Diagnosis not present

## 2018-02-20 DIAGNOSIS — M5136 Other intervertebral disc degeneration, lumbar region: Secondary | ICD-10-CM | POA: Diagnosis not present

## 2018-02-20 DIAGNOSIS — N189 Chronic kidney disease, unspecified: Secondary | ICD-10-CM | POA: Diagnosis not present

## 2018-02-20 DIAGNOSIS — M80021D Age-related osteoporosis with current pathological fracture, right humerus, subsequent encounter for fracture with routine healing: Secondary | ICD-10-CM | POA: Diagnosis not present

## 2018-02-25 DIAGNOSIS — N189 Chronic kidney disease, unspecified: Secondary | ICD-10-CM | POA: Diagnosis not present

## 2018-02-25 DIAGNOSIS — I129 Hypertensive chronic kidney disease with stage 1 through stage 4 chronic kidney disease, or unspecified chronic kidney disease: Secondary | ICD-10-CM | POA: Diagnosis not present

## 2018-02-25 DIAGNOSIS — M80021D Age-related osteoporosis with current pathological fracture, right humerus, subsequent encounter for fracture with routine healing: Secondary | ICD-10-CM | POA: Diagnosis not present

## 2018-02-25 DIAGNOSIS — E1122 Type 2 diabetes mellitus with diabetic chronic kidney disease: Secondary | ICD-10-CM | POA: Diagnosis not present

## 2018-02-25 DIAGNOSIS — E1100 Type 2 diabetes mellitus with hyperosmolarity without nonketotic hyperglycemic-hyperosmolar coma (NKHHC): Secondary | ICD-10-CM | POA: Diagnosis not present

## 2018-02-25 DIAGNOSIS — M5136 Other intervertebral disc degeneration, lumbar region: Secondary | ICD-10-CM | POA: Diagnosis not present

## 2018-02-26 ENCOUNTER — Telehealth: Payer: Self-pay

## 2018-02-26 NOTE — Telephone Encounter (Signed)
Pharmacist Molly Maduroobert from [facility] called requesting confirmation on calcium carbonate-vit d rx. Is pt taking chewable form or tablet form? Pls advise, thanks.

## 2018-02-26 NOTE — Telephone Encounter (Signed)
Chewable tablet is what I have listed, either would be fine.

## 2018-02-27 DIAGNOSIS — N189 Chronic kidney disease, unspecified: Secondary | ICD-10-CM | POA: Diagnosis not present

## 2018-02-27 DIAGNOSIS — M80021D Age-related osteoporosis with current pathological fracture, right humerus, subsequent encounter for fracture with routine healing: Secondary | ICD-10-CM | POA: Diagnosis not present

## 2018-02-27 DIAGNOSIS — M5136 Other intervertebral disc degeneration, lumbar region: Secondary | ICD-10-CM | POA: Diagnosis not present

## 2018-02-27 DIAGNOSIS — I129 Hypertensive chronic kidney disease with stage 1 through stage 4 chronic kidney disease, or unspecified chronic kidney disease: Secondary | ICD-10-CM | POA: Diagnosis not present

## 2018-02-27 DIAGNOSIS — E1122 Type 2 diabetes mellitus with diabetic chronic kidney disease: Secondary | ICD-10-CM | POA: Diagnosis not present

## 2018-02-27 DIAGNOSIS — E1100 Type 2 diabetes mellitus with hyperosmolarity without nonketotic hyperglycemic-hyperosmolar coma (NKHHC): Secondary | ICD-10-CM | POA: Diagnosis not present

## 2018-02-27 NOTE — Telephone Encounter (Signed)
Pharmacy advised  

## 2018-03-04 ENCOUNTER — Other Ambulatory Visit: Payer: Self-pay | Admitting: Osteopathic Medicine

## 2018-03-04 ENCOUNTER — Encounter: Payer: Self-pay | Admitting: Osteopathic Medicine

## 2018-03-04 DIAGNOSIS — E1122 Type 2 diabetes mellitus with diabetic chronic kidney disease: Secondary | ICD-10-CM | POA: Diagnosis not present

## 2018-03-04 DIAGNOSIS — M5136 Other intervertebral disc degeneration, lumbar region: Secondary | ICD-10-CM | POA: Diagnosis not present

## 2018-03-04 DIAGNOSIS — I129 Hypertensive chronic kidney disease with stage 1 through stage 4 chronic kidney disease, or unspecified chronic kidney disease: Secondary | ICD-10-CM | POA: Diagnosis not present

## 2018-03-04 DIAGNOSIS — M80021D Age-related osteoporosis with current pathological fracture, right humerus, subsequent encounter for fracture with routine healing: Secondary | ICD-10-CM | POA: Diagnosis not present

## 2018-03-04 DIAGNOSIS — E1100 Type 2 diabetes mellitus with hyperosmolarity without nonketotic hyperglycemic-hyperosmolar coma (NKHHC): Secondary | ICD-10-CM | POA: Diagnosis not present

## 2018-03-04 DIAGNOSIS — N189 Chronic kidney disease, unspecified: Secondary | ICD-10-CM | POA: Diagnosis not present

## 2018-03-31 ENCOUNTER — Ambulatory Visit: Payer: Medicare Other | Admitting: Osteopathic Medicine

## 2018-04-02 ENCOUNTER — Ambulatory Visit: Payer: Medicare Other | Admitting: Osteopathic Medicine

## 2018-04-02 DIAGNOSIS — S42411D Displaced simple supracondylar fracture without intercondylar fracture of right humerus, subsequent encounter for fracture with routine healing: Secondary | ICD-10-CM | POA: Diagnosis not present

## 2018-04-02 DIAGNOSIS — S42401D Unspecified fracture of lower end of right humerus, subsequent encounter for fracture with routine healing: Secondary | ICD-10-CM | POA: Diagnosis not present

## 2018-04-03 ENCOUNTER — Ambulatory Visit (INDEPENDENT_AMBULATORY_CARE_PROVIDER_SITE_OTHER): Payer: Medicare Other | Admitting: Osteopathic Medicine

## 2018-04-03 ENCOUNTER — Encounter: Payer: Self-pay | Admitting: Osteopathic Medicine

## 2018-04-03 VITALS — BP 95/60 | HR 91 | Temp 98.1°F | Wt 194.7 lb

## 2018-04-03 DIAGNOSIS — G894 Chronic pain syndrome: Secondary | ICD-10-CM

## 2018-04-03 DIAGNOSIS — M5126 Other intervertebral disc displacement, lumbar region: Secondary | ICD-10-CM | POA: Diagnosis not present

## 2018-04-03 DIAGNOSIS — M5136 Other intervertebral disc degeneration, lumbar region: Secondary | ICD-10-CM | POA: Diagnosis not present

## 2018-04-03 DIAGNOSIS — I10 Essential (primary) hypertension: Secondary | ICD-10-CM | POA: Diagnosis not present

## 2018-04-03 DIAGNOSIS — E119 Type 2 diabetes mellitus without complications: Secondary | ICD-10-CM | POA: Diagnosis not present

## 2018-04-03 DIAGNOSIS — I9589 Other hypotension: Secondary | ICD-10-CM | POA: Diagnosis not present

## 2018-04-03 LAB — POCT GLYCOSYLATED HEMOGLOBIN (HGB A1C): Hemoglobin A1C: 6.1 % — AB (ref 4.0–5.6)

## 2018-04-03 MED ORDER — FENTANYL 50 MCG/HR TD PT72: 50.0000 ug | MEDICATED_PATCH | TRANSDERMAL | 0 refills | Status: DC

## 2018-04-03 MED ORDER — GLYBURIDE 2.5 MG PO TABS: 2.5000 mg | ORAL_TABLET | Freq: Every day | ORAL | 3 refills | Status: DC

## 2018-04-03 MED ORDER — MONTELUKAST SODIUM 10 MG PO TABS: 10.0000 mg | ORAL_TABLET | Freq: Every day | ORAL | 3 refills | Status: DC

## 2018-04-03 MED ORDER — DULOXETINE HCL 30 MG PO CPEP: 90.0000 mg | ORAL_CAPSULE | Freq: Every day | ORAL | 3 refills | Status: DC

## 2018-04-03 MED ORDER — RABEPRAZOLE SODIUM 20 MG PO TBEC: 20.0000 mg | DELAYED_RELEASE_TABLET | Freq: Every day | ORAL | 3 refills | Status: DC

## 2018-04-03 MED ORDER — LOSARTAN POTASSIUM 100 MG PO TABS: 100.0000 mg | ORAL_TABLET | Freq: Every day | ORAL | 3 refills | Status: DC

## 2018-04-03 MED ORDER — METOPROLOL SUCCINATE ER 50 MG PO TB24: 50.0000 mg | ORAL_TABLET | Freq: Every day | ORAL | 3 refills | Status: DC

## 2018-04-03 NOTE — Progress Notes (Signed)
HPI: Teresa Medina is a 72 y.o. female  who presents to [facility] today, 04/03/18,  for chief complaint of:  Pain medication refill, sugar recheck.   Chronic pain: Patient is on fentanyl patches chronically, doing well on these. NCCSRS reviewed and no concerns - she broke her arm and had been getting additional opiates from her orthopedic team w/ recent surgery, these have been dicsontinued. Last Fentanyl fill 03/06/18  Diabetes: A1C today looking good. Doing well with diabetic diet most of the time, better w/ diet when was in assisted living for rehab w/ arm. 6.1% A1C today.   BP on the low side but no symptoms.     Past medical history, surgical history, social history and family history reviewed.  Patient Active Problem List   Diagnosis Date Noted  . Closed fracture of distal end of humerus 12/19/2017  . Type 2 diabetes with nephropathy (HCC) 11/29/2015  . Chronic kidney disease (CKD) 11/29/2015  . Hx of bone density study 11/29/2015  . Postmenopausal 08/31/2015  . Chronic pain syndrome 06/23/2015  . Levoscoliosis 06/23/2015  . Retrolisthesis of vertebrae 06/23/2015  . Degenerative disc disease, lumbar 06/23/2015  . Herniation of intervertebral disc between L4 and L5 06/23/2015  . Type 2 diabetes mellitus (HCC) 06/02/2015  . Essential hypertension 06/02/2015  . Migraine 06/02/2015  . Seasonal allergies 06/02/2015  . History of vitamin D deficiency 06/02/2015  . Osteoporosis 06/02/2015  . GERD (gastroesophageal reflux disease) 06/02/2015    Current medication list and allergy/intolerance information reviewed.   Current Outpatient Medications on File Prior to Visit  Medication Sig Dispense Refill  . aspirin EC 81 MG tablet Take 81 mg by mouth daily.    . Biotin 2130810000 MCG TABS Take 10,000 mcg by mouth daily.    . Calcium Carbonate-Vitamin D 600-400 MG-UNIT chew tablet Chew 2 tablets by mouth daily. 180 tablet 2  . Cholecalciferol  (VITAMIN D-3) 1000 UNITS CAPS Take 2,000 Units by mouth daily.     . cyclobenzaprine (FLEXERIL) 10 MG tablet Take 1 tablet (10 mg total) by mouth 3 (three) times daily. 90 tablet 0  . DULoxetine (CYMBALTA) 30 MG capsule Take 3 capsules (90 mg total) by mouth daily. 270 capsule 0  . fluticasone (FLONASE) 50 MCG/ACT nasal spray Place 1 spray into both nostrils daily. 48 g 3  . glucose blood test strip by Other route. Free Style Test Strips    . glyBURIDE (DIABETA) 2.5 MG tablet Take 1 tablet (2.5 mg total) by mouth daily with breakfast. 90 tablet 0  . Homeopathic Products (SIMILASAN DRY EYE RELIEF OP) Apply 1 drop to eye 2 (two) times daily.    Marland Kitchen. HYDROcodone-acetaminophen (NORCO/VICODIN) 5-325 MG tablet Take 1 tablet by mouth every 6 (six) hours as needed for severe pain. 10 tablet 0  . losartan (COZAAR) 100 MG tablet Take 1 tablet (100 mg total) by mouth daily. 90 tablet 0  . metoprolol succinate (TOPROL-XL) 50 MG 24 hr tablet Take 1 tablet (50 mg total) by mouth daily. Take with or immediately following a meal. 90 tablet 0  . montelukast (SINGULAIR) 10 MG tablet Take 1 tablet (10 mg total) by mouth at bedtime. 90 tablet 0  . RABEprazole (ACIPHEX) 20 MG tablet Take 1 tablet (20 mg total) by mouth daily. 90 tablet 3  . SUMAtriptan (IMITREX) 100 MG tablet TAKE ONE TABLET BY MOUTH AS NEEDED FOR MIGRAINE. MAY REPEAT IN TWO HOURS IF HEADACHE PERSISTS OR RECURS. 30 tablet 3  .  SUMAtriptan (IMITREX) 100 MG tablet TAKE ONE TABLET BY MOUTH AS NEEDED FOR MIGRAINE. MAY REPEAT IN TWO HOURS IF HEADACHE PERSISTS OR RECURS. 30 tablet 0  . topiramate (TOPAMAX) 50 MG tablet Take 3 tablets (150 mg total) by mouth at bedtime. 270 tablet 3  . topiramate (TOPAMAX) 50 MG tablet Take 3 tablets (150 mg total) by mouth at bedtime. 270 tablet 0   No current facility-administered medications on file prior to visit.    No Known Allergies    Review of Systems:  Constitutional: No recent illness  Cardiac: No  chest pain,  No  pressure  Respiratory:  No  shortness of breath. No  Cough  Neurologic: No  weakness, No  Dizziness  Psychiatric: No  concerns with depression, No  concerns with anxiety  Exam:  BP 95/60   Pulse 91   Temp 98.1 F (36.7 C) (Oral)   Wt 194 lb 11.2 oz (88.3 kg)   BMI 36.79 kg/m   Constitutional: VS see above. General Appearance: alert, well-developed, well-nourished, NAD  Eyes: Normal lids and conjunctive, non-icteric sclera  Ears, Nose, Mouth, Throat: MMM, Normal external inspection ears/nares/mouth/lips/gums.  Neck: No masses, trachea midline.   Respiratory: Normal respiratory effort. no wheeze, no rhonchi, no rales  Cardiovascular: S1/S2 normal, no murmur, no rub/gallop auscultated. RRR.   Neurological: Normal balance/coordination. No tremor.  Skin: warm, dry, intact.   Psychiatric: Normal judgment/insight. Normal mood and affect. Oriented x3.     ASSESSMENT/PLAN:  The primary encounter diagnosis was Type 2 diabetes mellitus without complication, without long-term current use of insulin (HCC). Diagnoses of Chronic pain syndrome, Degenerative disc disease, lumbar, Herniation of intervertebral disc between L4 and L5, Essential hypertension, and Other specified hypotension were also pertinent to this visit.   Orders Placed This Encounter  Procedures  . CBC  . COMPLETE METABOLIC PANEL WITH GFR  . POCT HgB A1C     Follow-up plan: Return in about 3 months (around 07/04/2018) for maintain prescription pain medications and A1C follow-up. 1-2 weeks nurse visit recheck BP, lab also.  Visit summary with medication list and pertinent instructions was printed for patient to review, alert [place] if any changes needed. All questions at time of visit were answered - patient instructed to contact office with any additional concerns. ER/RTC precautions were reviewed with the patient and understanding verbalized.

## 2018-04-17 ENCOUNTER — Ambulatory Visit (INDEPENDENT_AMBULATORY_CARE_PROVIDER_SITE_OTHER): Payer: Medicare Other | Admitting: Osteopathic Medicine

## 2018-04-17 VITALS — BP 129/58 | HR 89 | Wt 197.0 lb

## 2018-04-17 DIAGNOSIS — I10 Essential (primary) hypertension: Secondary | ICD-10-CM | POA: Diagnosis not present

## 2018-04-17 DIAGNOSIS — I9589 Other hypotension: Secondary | ICD-10-CM | POA: Diagnosis not present

## 2018-04-17 NOTE — Progress Notes (Signed)
   Subjective:    Patient ID: Teresa Medina, female    DOB: Dec 01, 1945, 72 y.o.   MRN: [id number]  HPI  Teresa Medina is here for blood pressure check. Last blood pressure was a little low. Today it is mostly in normal range. Denies any chest pain and shortness of breath.   Review of Systems     Objective:   Physical Exam        Assessment & Plan:  Blood pressure check - blood pressure 129/58. Patient advised to continue current medications as directed and follow up as directed. Patient will go to the lab for CMP and CBC.

## 2018-04-18 LAB — COMPLETE METABOLIC PANEL WITH GFR
AG RATIO: 1.4 (calc) (ref 1.0–2.5)
ALBUMIN MSPROF: 3.9 g/dL (ref 3.6–5.1)
ALT: 10 U/L (ref 6–29)
AST: 13 U/L (ref 10–35)
Alkaline phosphatase (APISO): 132 U/L — ABNORMAL HIGH (ref 33–130)
BILIRUBIN TOTAL: 0.3 mg/dL (ref 0.2–1.2)
BUN / CREAT RATIO: 14 (calc) (ref 6–22)
BUN: 19 mg/dL (ref 7–25)
CHLORIDE: 100 mmol/L (ref 98–110)
CO2: 28 mmol/L (ref 20–32)
Calcium: 9.3 mg/dL (ref 8.6–10.4)
Creat: 1.33 mg/dL — ABNORMAL HIGH (ref 0.60–0.93)
GFR, EST AFRICAN AMERICAN: 46 mL/min/{1.73_m2} — AB (ref >=60)
GFR, EST NON AFRICAN AMERICAN: 40 mL/min/{1.73_m2} — AB (ref >=60)
GLOBULIN: 2.7 g/dL (ref 1.9–3.7)
GLUCOSE: 122 mg/dL — AB (ref 65–99)
Potassium: 4.4 mmol/L (ref 3.5–5.3)
Sodium: 136 mmol/L (ref 135–146)
TOTAL PROTEIN: 6.6 g/dL (ref 6.1–8.1)

## 2018-04-18 LAB — CBC
HEMATOCRIT: 42.4 % (ref 35.0–45.0)
Hemoglobin: 13.8 g/dL (ref 11.7–15.5)
MCH: 27.5 pg (ref 27.0–33.0)
MCHC: 32.5 g/dL (ref 32.0–36.0)
MCV: 84.6 fL (ref 80.0–100.0)
MPV: 11.3 fL (ref 7.5–12.5)
PLATELETS: 229 10*3/uL (ref 140–400)
RBC: 5.01 10*6/uL (ref 3.80–5.10)
RDW: 12.2 % (ref 11.0–15.0)
WBC: 10.3 10*3/uL (ref 3.8–10.8)

## 2018-04-18 NOTE — Progress Notes (Signed)
Pt has seen results on MyChart and message also sent for patient to call back if any questions.

## 2018-05-01 DIAGNOSIS — M810 Age-related osteoporosis without current pathological fracture: Secondary | ICD-10-CM | POA: Diagnosis not present

## 2018-05-10 ENCOUNTER — Other Ambulatory Visit: Payer: Self-pay | Admitting: Osteopathic Medicine

## 2018-05-10 DIAGNOSIS — I10 Essential (primary) hypertension: Secondary | ICD-10-CM

## 2018-05-10 DIAGNOSIS — G894 Chronic pain syndrome: Secondary | ICD-10-CM

## 2018-05-10 DIAGNOSIS — E119 Type 2 diabetes mellitus without complications: Secondary | ICD-10-CM

## 2018-05-12 ENCOUNTER — Other Ambulatory Visit: Payer: Self-pay

## 2018-05-12 DIAGNOSIS — Z8639 Personal history of other endocrine, nutritional and metabolic disease: Secondary | ICD-10-CM

## 2018-05-12 MED ORDER — SUMATRIPTAN SUCCINATE 100 MG PO TABS: ORAL_TABLET | ORAL | 0 refills | Status: DC

## 2018-05-12 NOTE — Telephone Encounter (Signed)
At pt's request - rxs sent on 04/03/18 cancelled at [facility]. All rxs (rabeprazole, topirimate, duloxentine, losartan, metoprolol succ, montelukast, gluburide & imitrex) sent to [facility] pharmacy.

## 2018-06-06 ENCOUNTER — Other Ambulatory Visit: Payer: Self-pay | Admitting: Osteopathic Medicine

## 2018-06-06 DIAGNOSIS — Z8639 Personal history of other endocrine, nutritional and metabolic disease: Secondary | ICD-10-CM

## 2018-06-07 NOTE — Telephone Encounter (Signed)
Refill req sent to [facility] at [facility] Should have been sent to Sunnie Nielsen DO at [facility] at [facility]

## 2018-06-10 ENCOUNTER — Encounter: Payer: Self-pay | Admitting: Osteopathic Medicine

## 2018-07-07 ENCOUNTER — Telehealth: Payer: Self-pay | Admitting: Osteopathic Medicine

## 2018-07-07 ENCOUNTER — Ambulatory Visit: Payer: Medicare Other | Admitting: Osteopathic Medicine

## 2018-07-07 NOTE — Telephone Encounter (Signed)
noted 

## 2018-07-07 NOTE — Telephone Encounter (Signed)
Left pt msg to reschedule. Advised in msg that no RF can be sent until she is seen

## 2018-07-07 NOTE — Telephone Encounter (Signed)
Called Pt. She reports she fell this morning while getting ready for her appointment and her son had to help get her up. She reports she did not hit her head, and has no other symptoms aside from general pain from "falling back on her bottom."  Pt transferred to scheduler to make appt. No further questions.

## 2018-07-07 NOTE — Telephone Encounter (Signed)
Patient missed her appointment today for maintenance of controlled substance pain meds - needs to reschedule so she isn't out over the holiday weekend

## 2018-07-07 NOTE — Telephone Encounter (Signed)
FYI

## 2018-07-07 NOTE — Telephone Encounter (Signed)
Received fax its been approved from 06/07/2018 through 07/07/2019. Form sent to scan. Pharmacy aware.

## 2018-07-07 NOTE — Telephone Encounter (Signed)
Received call from [facility], they advised the Rx for:  RABEprazole (Aciphex) 20mg  tablet.  CoverMyMeds key code: [id number]. Routing.

## 2018-07-14 ENCOUNTER — Other Ambulatory Visit: Payer: Self-pay | Admitting: Osteopathic Medicine

## 2018-07-14 DIAGNOSIS — M5136 Other intervertebral disc degeneration, lumbar region: Secondary | ICD-10-CM

## 2018-07-14 DIAGNOSIS — M5126 Other intervertebral disc displacement, lumbar region: Secondary | ICD-10-CM

## 2018-07-15 NOTE — Telephone Encounter (Signed)
Requesting RF on Fentanyl   Last RX sent 06/02/18  RX pended, please send if appropriate

## 2018-07-16 ENCOUNTER — Encounter: Payer: Self-pay | Admitting: Osteopathic Medicine

## 2018-07-16 ENCOUNTER — Ambulatory Visit (INDEPENDENT_AMBULATORY_CARE_PROVIDER_SITE_OTHER): Payer: Medicare Other | Admitting: Osteopathic Medicine

## 2018-07-16 VITALS — BP 116/51 | HR 76 | Temp 97.6°F | Wt 192.7 lb

## 2018-07-16 DIAGNOSIS — G894 Chronic pain syndrome: Secondary | ICD-10-CM

## 2018-07-16 DIAGNOSIS — Z23 Encounter for immunization: Secondary | ICD-10-CM | POA: Diagnosis not present

## 2018-07-16 DIAGNOSIS — E11649 Type 2 diabetes mellitus with hypoglycemia without coma: Secondary | ICD-10-CM

## 2018-07-16 DIAGNOSIS — I952 Hypotension due to drugs: Secondary | ICD-10-CM

## 2018-07-16 DIAGNOSIS — M5136 Other intervertebral disc degeneration, lumbar region: Secondary | ICD-10-CM | POA: Diagnosis not present

## 2018-07-16 DIAGNOSIS — M4310 Spondylolisthesis, site unspecified: Secondary | ICD-10-CM

## 2018-07-16 LAB — POCT GLYCOSYLATED HEMOGLOBIN (HGB A1C): HEMOGLOBIN A1C: 5.6 % (ref 4.0–5.6)

## 2018-07-16 NOTE — Progress Notes (Signed)
HPI: Teresa Medina is a 72 y.o. female  who presents to [facility] today, 07/16/18,  for chief complaint of:  Pain medication refill, sugar recheck.   Chronic pain: Patient is on fentanyl patches chronically, doing well on these. NCCSRS reviewed and no concerns - she broke her arm and had been getting additional opiates from her orthopedic team w/ recent surgery, these have been dicsontinued. Last Fentanyl fill 06/06/18, I refilled Rx yesterday but she hasn't picked it up yet. Pt has controlled substance agreement on file as of 09/2017  Diabetes: A1C has been looking good. Doing well with diabetic diet most of the time, was better w/ diet when was in assisted living for rehab w/ broken arm. 6.1% A1C last visit 3 mos ago, today 5.6% reports AM fasting Glc around 50's.   Results for orders placed or performed in visit on 07/16/18 (from the past 24 hour(s))  POCT HgB A1C     Status: None   Collection Time: 07/16/18  1:21 PM  Result Value Ref Range   Hemoglobin A1C 5.6 4.0 - 5.6 %   HbA1c POC (<> result, manual entry)     HbA1c, POC (prediabetic range)     HbA1c, POC (controlled diabetic range)      Wt Readings from Last 3 Encounters:  07/16/18 192 lb 11.2 oz (87.4 kg)  04/17/18 197 lb (89.4 kg)  04/03/18 194 lb 11.2 oz (88.3 kg)     BP on the low side but no symptoms.     Past medical history, surgical history, social history and family history reviewed.  Patient Active Problem List   Diagnosis Date Noted  . Closed fracture of distal end of humerus 12/19/2017  . Type 2 diabetes with nephropathy (HCC) 11/29/2015  . Chronic kidney disease (CKD) 11/29/2015  . Hx of bone density study 11/29/2015  . Postmenopausal 08/31/2015  . Chronic pain syndrome 06/23/2015  . Levoscoliosis 06/23/2015  . Retrolisthesis of vertebrae 06/23/2015  . Degenerative disc disease, lumbar 06/23/2015  . Herniation of intervertebral disc between L4 and L5 06/23/2015   . Type 2 diabetes mellitus (HCC) 06/02/2015  . Essential hypertension 06/02/2015  . Migraine 06/02/2015  . Seasonal allergies 06/02/2015  . History of vitamin D deficiency 06/02/2015  . Osteoporosis 06/02/2015  . GERD (gastroesophageal reflux disease) 06/02/2015    Current medication list and allergy/intolerance information reviewed.   Current Outpatient Medications on File Prior to Visit  Medication Sig Dispense Refill  . aspirin EC 81 MG tablet Take 81 mg by mouth daily.    . Biotin 16109 MCG TABS Take 10,000 mcg by mouth daily.    . Calcium Carbonate-Vitamin D 600-400 MG-UNIT chew tablet Chew 2 tablets by mouth daily. 180 tablet 2  . Cholecalciferol (VITAMIN D-3) 1000 UNITS CAPS Take 2,000 Units by mouth daily.     . cyclobenzaprine (FLEXERIL) 10 MG tablet TAKE ONE TABLET BY MOUTH THREE TIMES DAILY 90 tablet 0  . DULoxetine (CYMBALTA) 30 MG capsule Take 3 capsules (90 mg total) by mouth daily. 270 capsule 3  . fentaNYL (DURAGESIC - DOSED MCG/HR) 50 MCG/HR Place 1 patch (50 mcg total) onto the skin every 3 (three) days. 10 patch 0  . fluticasone (FLONASE) 50 MCG/ACT nasal spray Place 1 spray into both nostrils daily. 48 g 3  . glucose blood test strip by Other route. Free Style Test Strips    . glyBURIDE (DIABETA) 2.5 MG tablet Take 1 tablet (2.5 mg total) by mouth daily  with breakfast. 90 tablet 3  . Homeopathic Products (SIMILASAN DRY EYE RELIEF OP) Apply 1 drop to eye 2 (two) times daily.    Marland Kitchen. losartan (COZAAR) 100 MG tablet Take 1 tablet (100 mg total) by mouth daily. 90 tablet 3  . metoprolol succinate (TOPROL-XL) 50 MG 24 hr tablet Take 1 tablet (50 mg total) by mouth daily. Take with or immediately following a meal. 90 tablet 3  . montelukast (SINGULAIR) 10 MG tablet Take 1 tablet (10 mg total) by mouth at bedtime. 90 tablet 3  . RABEprazole (ACIPHEX) 20 MG tablet Take 1 tablet (20 mg total) by mouth daily. 90 tablet 3  . SUMAtriptan (IMITREX) 100 MG tablet TAKE ONE TABLET BY  MOUTH AS NEEDED FOR MIGRAINE. MAY REPEAT IN TWO HOURS IF HEADACHE PERSISTS OR RECURS. 30 tablet 0  . topiramate (TOPAMAX) 50 MG tablet Take 3 tablets (150 mg total) by mouth at bedtime. 270 tablet 3   No current facility-administered medications on file prior to visit.    No Known Allergies    Review of Systems:  Constitutional: No recent illness  Cardiac: No  chest pain, No  pressure  Respiratory:  No  shortness of breath. No  Cough  Neurologic: No  weakness, No  Dizziness  Psychiatric: No  concerns with depression, No  concerns with anxiety  Exam:  BP (!) 116/51   Pulse 76   Temp 97.6 F (36.4 C) (Oral)   Wt 192 lb 11.2 oz (87.4 kg)   BMI 36.41 kg/m   Constitutional: VS see above. General Appearance: alert, well-developed, well-nourished, NAD  Eyes: Normal lids and conjunctive, non-icteric sclera  Ears, Nose, Mouth, Throat: MMM, Normal external inspection ears/nares/mouth/lips/gums.  Neck: No masses, trachea midline.   Respiratory: Normal respiratory effort. no wheeze, no rhonchi, no rales  Cardiovascular: S1/S2 normal, no murmur, no rub/gallop auscultated. RRR.   Neurological: Normal balance/coordination. No tremor.  Skin: warm, dry, intact.   Psychiatric: Normal judgment/insight. Normal mood and affect. Oriented x3.     ASSESSMENT/PLAN:  The primary encounter diagnosis was Type 2 diabetes mellitus with hypoglycemia without coma, without long-term current use of insulin (HCC). Diagnoses of Needs flu shot, Degenerative disc disease, lumbar, Retrolisthesis of vertebrae, Chronic pain syndrome, and Hypotension due to drugs were also pertinent to this visit.   Orders Placed This Encounter  Procedures  . Flu vaccine HIGH DOSE PF (Fluzone High dose)  . POCT HgB A1C    Patient Instructions  Plan:  Fentanyl patches refilled yesterday  Try using the Flonase nasal spray daily to help congestion   STOP Glyburide - this I slikely dropping your sugars   STOP  Metoprolol - this is likely causing blood pressure to be too low    Pt advised d/c metoprolol may cause palpitations, if this happens, let me know.     Follow-up plan: Return in about 2 weeks (around 07/30/2018) for nurse visit recheck BP off Metoprolol. If BP ok, can see Dr A in 3 mos for A1C recheck and pain med .  Visit summary with medication list and pertinent instructions was printed for patient to review, alert us if any changes needed. All questions at time of visit were answered - patient instructed to contact office with any additional concerns. ER/RTC precautions were reviewed with the patient and understanding verbalized.

## 2018-07-16 NOTE — Patient Instructions (Signed)
Plan:  Fentanyl patches refilled yesterday  Try using the Flonase nasal spray daily to help congestion   STOP Glyburide - this I slikely dropping your sugars   STOP Metoprolol - this is likely causing blood pressure to be too low

## 2018-07-30 ENCOUNTER — Ambulatory Visit (INDEPENDENT_AMBULATORY_CARE_PROVIDER_SITE_OTHER): Payer: Medicare Other | Admitting: Osteopathic Medicine

## 2018-07-30 VITALS — BP 91/56 | HR 121 | Temp 97.6°F

## 2018-07-30 DIAGNOSIS — I952 Hypotension due to drugs: Secondary | ICD-10-CM | POA: Diagnosis not present

## 2018-07-30 NOTE — Progress Notes (Signed)
Pt in today for BP check, at last visit pt was instructed to stop metoprolol. Pt states she did stop taking. BP in office today was 91/56 pulse 120 per Dr, Lyn HollingsheadAlexander pt is to decrease her losartin 100 mg tablet to 50 mg. Instructions were written down for patient and she will return in 2 weeks for a BP check with nurse.

## 2018-08-04 ENCOUNTER — Ambulatory Visit: Payer: Medicare Other | Admitting: Osteopathic Medicine

## 2018-08-15 ENCOUNTER — Ambulatory Visit (INDEPENDENT_AMBULATORY_CARE_PROVIDER_SITE_OTHER): Payer: Medicare Other | Admitting: Physician Assistant

## 2018-08-15 DIAGNOSIS — I952 Hypotension due to drugs: Secondary | ICD-10-CM | POA: Diagnosis not present

## 2018-08-15 NOTE — Progress Notes (Signed)
Pt in today for BP Check. At last visit her BP was 91/56 and provider decreased her losartin to 50 mg.Todays BP was 115/64 and pulse 111.  Pt also wanted Dr.Alexander to know her sugar levels were running 100-130, and wanted to know if she needed to restart her glyburide. Pt does not have follow up schedule so when I call her back I will need to know when she should follow up with Provider. Pt states she would like to receive message via my chart.  Stay at same BP dose. Fasting sugars 90-130 are at goal. Follow up in 2 months with PCP. Tandy Gaw PA-C

## 2018-08-15 NOTE — Progress Notes (Signed)
My chart message sent per patient request with recommendations.

## 2018-08-18 ENCOUNTER — Other Ambulatory Visit: Payer: Self-pay | Admitting: Osteopathic Medicine

## 2018-08-18 DIAGNOSIS — M5126 Other intervertebral disc displacement, lumbar region: Secondary | ICD-10-CM

## 2018-08-18 DIAGNOSIS — M5136 Other intervertebral disc degeneration, lumbar region: Secondary | ICD-10-CM

## 2018-08-18 NOTE — Telephone Encounter (Signed)
[facility] requesting med refill for fentanyl patches.  

## 2018-08-29 IMAGING — CT CT ELBOW*R* W/O CM
4 of 6 series · 14 of 33 positions shown, 16 images · non-contrast
Comparison: Right elbow radiograph dated 12/14/2017

CLINICAL DATA: 72-year-old female with right humeral fracture.

EXAM:
CT OF THE LOWER RIGHT EXTREMITY WITHOUT CONTRAST
TECHNIQUE: Multidetector CT imaging of the right lower extremity was performed
according to the standard protocol.

[Series 4: axial st · axial · 0.49mm/px · z∈[-194,-80]mm · 5 of 87 slices shown, 7 images]
[im 15/87  soft-tissue]
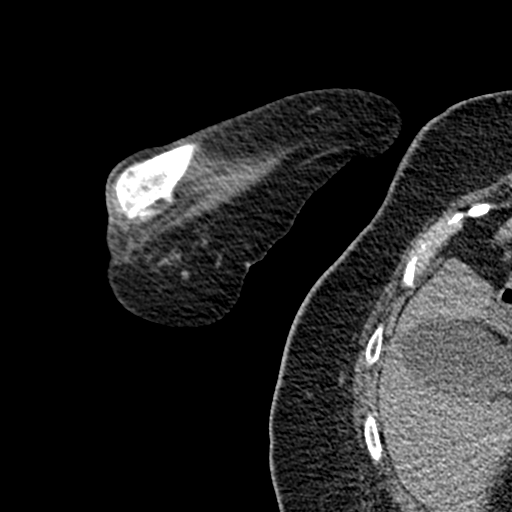
[im 15/87  bone]
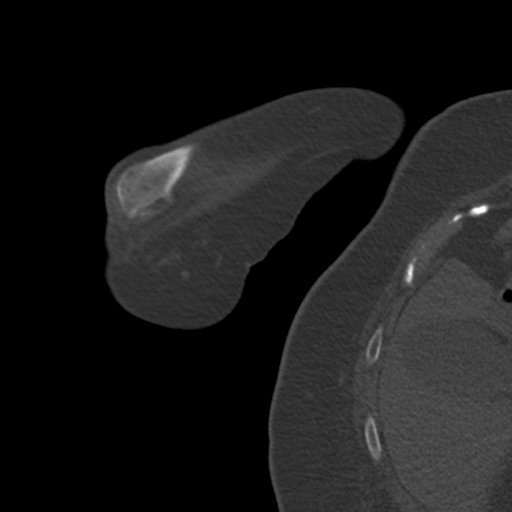
[im 29/87  bone]
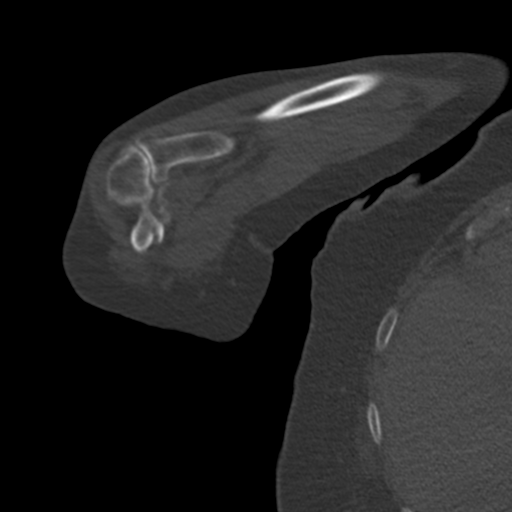
[im 44/87  bone]
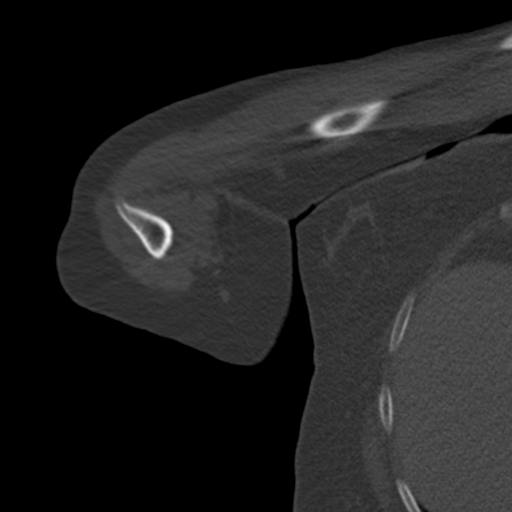
[im 58/87  bone]
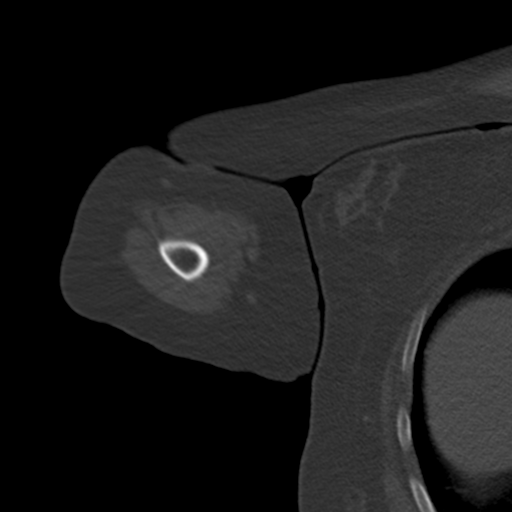
[im 72/87  soft-tissue]
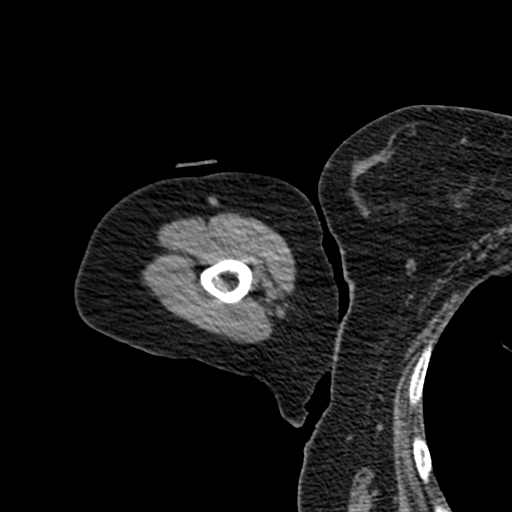
[im 72/87  bone]
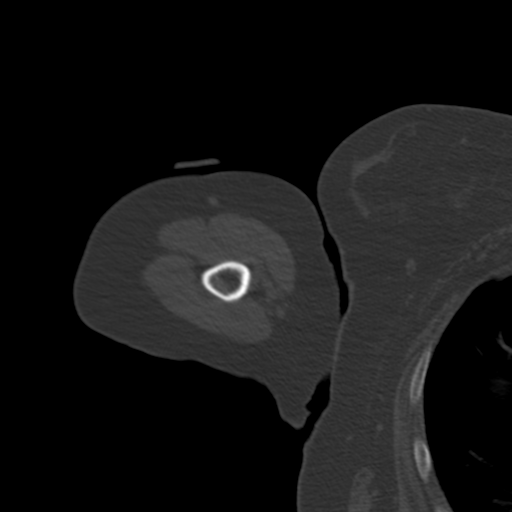

[Series 602: sag b · coronal · 0.49mm/px · 2 of 46 slices shown]
[im 22/46  bone]
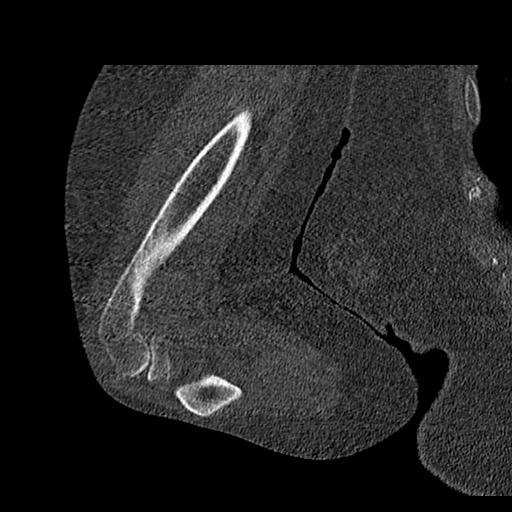
[im 43/46  bone]
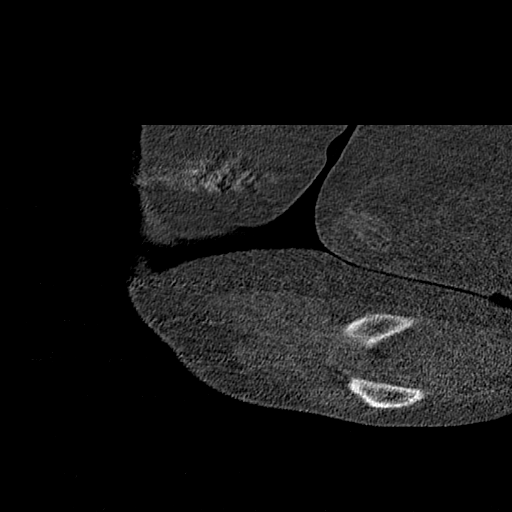

[Series 603: <mpr thick range> · axial · 0.49mm/px · z∈[-170,-53]mm · 5 of 89 slices shown]
[im 15/89  bone]
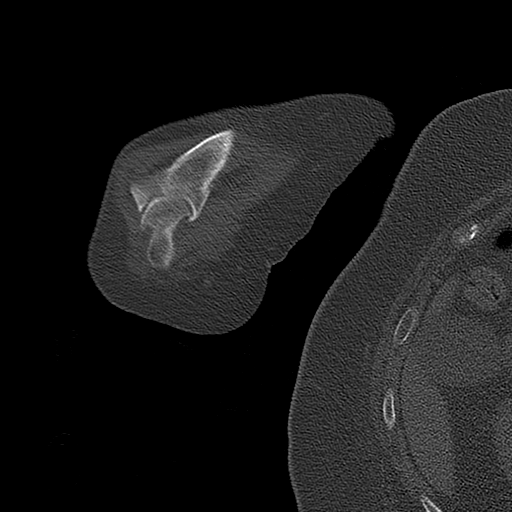
[im 30/89  bone]
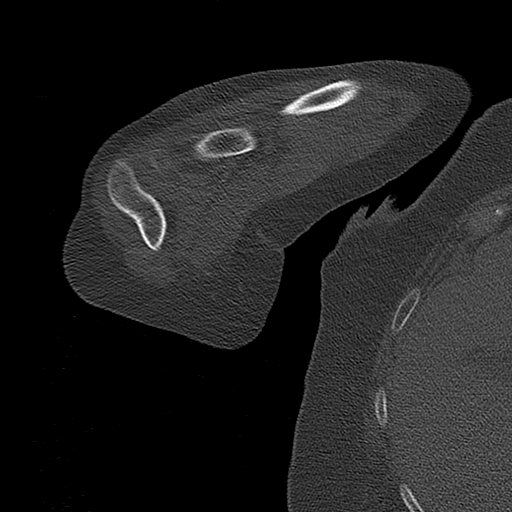
[im 45/89  bone]
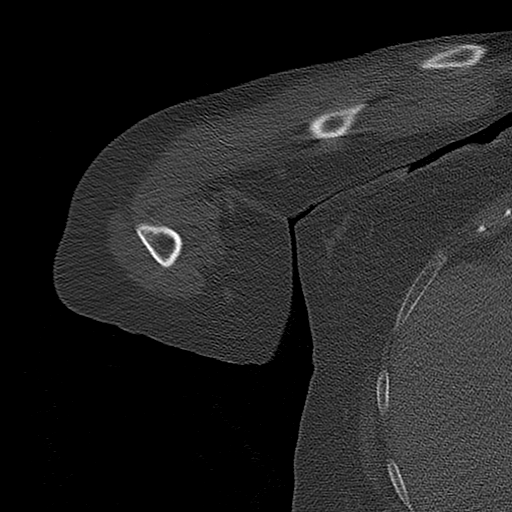
[im 59/89  bone]
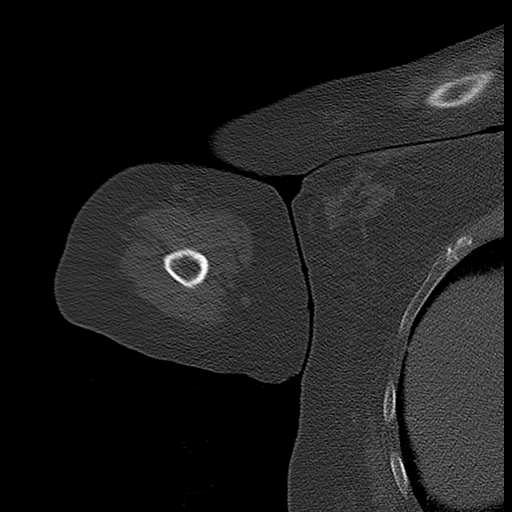
[im 74/89  bone]
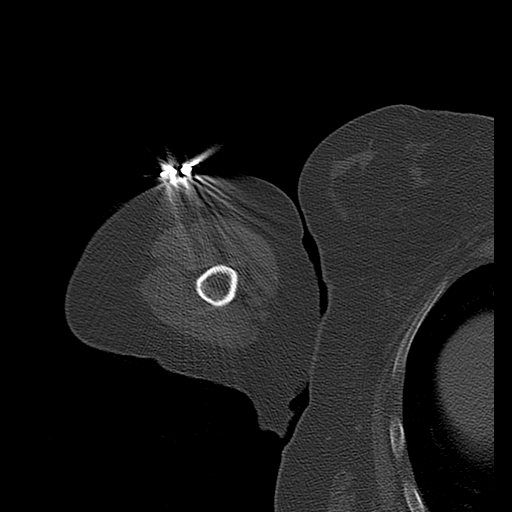

[Series 604: <mpr thick range(1)> · sagittal · 0.49mm/px · 2 of 39 slices shown]
[im 13/39  bone]
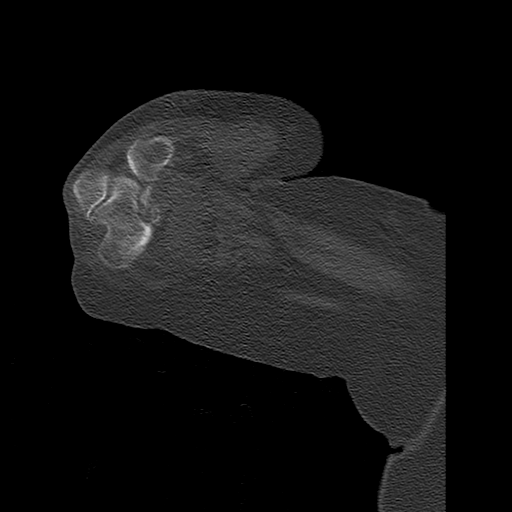
[im 26/39  bone]
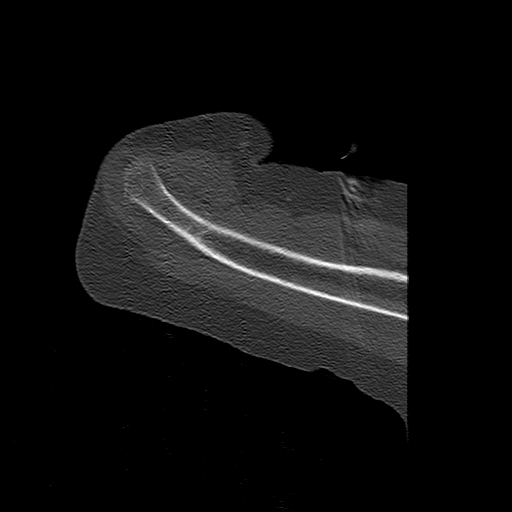

[14 of 33 positions shown; findings below may reference images not displayed]

FINDINGS: Evaluation is limited due to streak artifact and positioning of the
arm.

Bones/Joint/Cartilage

There is a mildly displaced transverse fracture of the distal
/supracondylar radius. There is mild volar angulation of the distal
fracture fragment. There is probable extension of the fracture into
the trochlea and medial epicondyle. A small bone fragment anterior
to the trochlea at the tip of the coronoid process noted which may
represent a fracture fragment from the true clear or medial
epicondyle. There is no dislocation.

Ligaments

Suboptimally assessed by CT.

Muscles and Tendons

No acute findings.  No intramuscular hematoma.

Soft tissues

No large fluid collection or hematoma.
IMPRESSION: Mildly displaced and angulated comminuted appearing fracture of the
distal/supracondylar radius with apparent extension into the
trochlea and possible involvement of the medial epicondyle. No
dislocation.

## 2018-09-09 ENCOUNTER — Other Ambulatory Visit: Payer: Self-pay | Admitting: Osteopathic Medicine

## 2018-09-09 NOTE — Telephone Encounter (Signed)
Last RF 06/11/18 for #90 no RF  Last OV 08/15/18  RX pended

## 2018-09-22 ENCOUNTER — Other Ambulatory Visit: Payer: Self-pay | Admitting: Osteopathic Medicine

## 2018-09-22 DIAGNOSIS — M5136 Other intervertebral disc degeneration, lumbar region: Secondary | ICD-10-CM

## 2018-09-22 DIAGNOSIS — M51369 Other intervertebral disc degeneration, lumbar region without mention of lumbar back pain or lower extremity pain: Secondary | ICD-10-CM

## 2018-09-22 DIAGNOSIS — M5126 Other intervertebral disc displacement, lumbar region: Secondary | ICD-10-CM

## 2018-09-22 NOTE — Telephone Encounter (Signed)
Last written 08/18/18   #10 patches  Last OV 08/15/18  RX pended, please review and send if appropriate

## 2018-09-26 ENCOUNTER — Telehealth: Payer: Self-pay

## 2018-09-26 DIAGNOSIS — I10 Essential (primary) hypertension: Secondary | ICD-10-CM

## 2018-09-26 NOTE — Telephone Encounter (Signed)
[facility] sent a fax - pt is requesting losartan 100 mg to be changed to 50 mg. Pls advise, thanks.

## 2018-09-27 MED ORDER — LOSARTAN POTASSIUM 50 MG PO TABS: 100.0000 mg | ORAL_TABLET | Freq: Every day | ORAL | 3 refills | Status: DC

## 2018-09-27 NOTE — Telephone Encounter (Signed)
Sent!

## 2018-09-29 NOTE — Telephone Encounter (Signed)
Pt has been updated of change in bp med. Aware new rx sent to mail order pharmacy. No other inquiries noted during call.

## 2018-10-15 ENCOUNTER — Encounter: Payer: Self-pay | Admitting: Osteopathic Medicine

## 2018-10-15 ENCOUNTER — Telehealth: Payer: Self-pay

## 2018-10-15 ENCOUNTER — Ambulatory Visit (INDEPENDENT_AMBULATORY_CARE_PROVIDER_SITE_OTHER): Payer: Medicare Other | Admitting: Osteopathic Medicine

## 2018-10-15 VITALS — BP 98/65 | HR 125 | Temp 97.5°F | Wt 187.2 lb

## 2018-10-15 DIAGNOSIS — I10 Essential (primary) hypertension: Secondary | ICD-10-CM | POA: Diagnosis not present

## 2018-10-15 DIAGNOSIS — R000 Tachycardia, unspecified: Secondary | ICD-10-CM | POA: Diagnosis not present

## 2018-10-15 DIAGNOSIS — M51369 Other intervertebral disc degeneration, lumbar region without mention of lumbar back pain or lower extremity pain: Secondary | ICD-10-CM

## 2018-10-15 DIAGNOSIS — M5136 Other intervertebral disc degeneration, lumbar region: Secondary | ICD-10-CM | POA: Diagnosis not present

## 2018-10-15 DIAGNOSIS — Z8639 Personal history of other endocrine, nutritional and metabolic disease: Secondary | ICD-10-CM

## 2018-10-15 DIAGNOSIS — E11649 Type 2 diabetes mellitus with hypoglycemia without coma: Secondary | ICD-10-CM | POA: Diagnosis not present

## 2018-10-15 DIAGNOSIS — M5126 Other intervertebral disc displacement, lumbar region: Secondary | ICD-10-CM

## 2018-10-15 LAB — POCT GLYCOSYLATED HEMOGLOBIN (HGB A1C): Hemoglobin A1C: 7.8 % — AB (ref 4.0–5.6)

## 2018-10-15 MED ORDER — SUMATRIPTAN SUCCINATE 100 MG PO TABS: 50.0000 mg | ORAL_TABLET | ORAL | 0 refills | Status: DC | PRN

## 2018-10-15 MED ORDER — FENTANYL 50 MCG/HR TD PT72: 10.0000 | MEDICATED_PATCH | TRANSDERMAL | 0 refills | Status: DC

## 2018-10-15 MED ORDER — METOPROLOL SUCCINATE ER 50 MG PO TB24: 50.0000 mg | ORAL_TABLET | Freq: Every day | ORAL | 3 refills | Status: DC

## 2018-10-15 NOTE — Progress Notes (Signed)
HPI: Teresa Medina is a 73 y.o. female who  has a past medical history of Arthritis, Chronic kidney disease (CKD) (11/29/2015), Chronic pain syndrome (06/23/2015), Degenerative disc disease, lumbar (06/23/2015), Depression, Diabetes mellitus without complication (HCC), Essential hypertension (06/02/2015), GERD (gastroesophageal reflux disease) (06/02/2015), Herniation of intervertebral disc between L4 and L5 (06/23/2015), History of vitamin D deficiency (06/02/2015), Hypertension, Levoscoliosis (06/23/2015), Migraine (06/02/2015), Osteoporosis (06/02/2015), Retrolisthesis of vertebrae (06/23/2015), Seasonal allergies (06/02/2015), Type 2 diabetes mellitus (HCC) (06/02/2015), and Type 2 diabetes with nephropathy (HCC) (11/29/2015).  she presents to [facility] today, 10/15/18,  for chief complaint of:  DM2 and pain Rx refill   Chronic pain: Patient is on fentanyl patches chronically, doing well on these. PDMP reviewed and no concerns. Last Fentanyl fill 09/22/2018. Pt has controlled substance agreement on file as of 09/2017 which will be updated today, 10/2018  Diabetes: A1C has been looking good. Doing well with diabetic diet most of the time, was better w/ diet when was in assisted living for rehab w/ broken arm. 5.6% A1C last visit 3 mos ago, reported AM fasting Glc around 50's so we stopped the Glyburide.  Today A1C 7.8%  BP: better on recheck, still tachycardic. EKG below. Pt has no chest pain, palpitations, dizziness.      At today's visit 10/15/18 ... PMH, PSH, FH reviewed and updated as needed.  Current medication list and allergy/intolerance hx reviewed and updated as needed. (See remainder of HPI, ROS, Phys Exam below)   No results found.   Results for orders placed or performed in visit on 10/15/18 (from the past 72 hour(s))  POCT HgB A1C     Status: Abnormal   Collection Time: 10/15/18  3:27 PM  Result Value Ref Range   Hemoglobin A1C 7.8  (A) 4.0 - 5.6 %   HbA1c POC (<> result, manual entry)     HbA1c, POC (prediabetic range)     HbA1c, POC (controlled diabetic range)       EKG interpretation: Rate: 110 Rhythm: sinus No ST/T changes concerning for acute ischemia/infarct          ASSESSMENT/PLAN: The primary encounter diagnosis was Degenerative disc disease, lumbar. Diagnoses of Herniation of intervertebral disc between L4 and L5, Essential hypertension, Type 2 diabetes mellitus with hypoglycemia without coma, without long-term current use of insulin (HCC), Tachycardia, and History of vitamin D deficiency were also pertinent to this visit.   Declined NV f/u BP  Has done fine on metoprolol in the past so I expect no real issue but if increased dizziness or weakness I advised her to seek care ASAP   Orders Placed This Encounter  Procedures  . CBC  . COMPLETE METABOLIC PANEL WITH GFR  . Lipid panel  . TSH  . POCT HgB A1C  . EKG 12-Lead     Meds ordered this encounter  Medications  . DISCONTD: fentaNYL (DURAGESIC) 50 MCG/HR    Sig: Place 10 patches onto the skin every 3 (three) days for 30 days.    Dispense:  10 patch    Refill:  0  . DISCONTD: fentaNYL (DURAGESIC) 50 MCG/HR    Sig: Place 10 patches onto the skin every 3 (three) days for 30 days.    Dispense:  10 patch    Refill:  0  . fentaNYL (DURAGESIC) 50 MCG/HR    Sig: Place 10 patches onto the skin every 3 (three) days for 30 days.    Dispense:  10 patch  Refill:  0  . SUMAtriptan (IMITREX) 100 MG tablet    Sig: Take 0.5-1 tablets (50-100 mg total) by mouth every 2 (two) hours as needed for migraine.    Dispense:  30 tablet    Refill:  0  . metoprolol succinate (TOPROL-XL) 50 MG 24 hr tablet    Sig: Take 1 tablet (50 mg total) by mouth daily. Take with or immediately following a meal.    Dispense:  90 tablet    Refill:  3    Patient Instructions  Refilled 3 months of Fentanyl, prescriptions should be on file at the pharmacy, please call  them when you are due for refills!  Sugars: can restart the Glyburide at half dose and will recheck A1C in 3 months.   Heart rate is high but EKG does not show an abnormal rhythm for this. I'd like to get some blood work (you're due anyway) to check for other possible causes. If you experience chest pain, short of breath, palpitations - please seek medical care!   Stop losartan Can restart metoprolol 50 mg           Follow-up plan: Return in about 3 months (around 01/15/2019) for follow-up A1C and maintain Fentanyl Rx .                                                 ################################################# ################################################# ################################################# #################################################    Current Meds  Medication Sig  . aspirin EC 81 MG tablet Take 81 mg by mouth daily.  . Biotin 29562 MCG TABS Take 10,000 mcg by mouth daily.  . Cholecalciferol (VITAMIN D-3) 1000 UNITS CAPS Take 2,000 Units by mouth daily.   . cyclobenzaprine (FLEXERIL) 10 MG tablet TAKE ONE TABLET BY MOUTH THREE TIMES DAILY  . DULoxetine (CYMBALTA) 30 MG capsule Take 3 capsules (90 mg total) by mouth daily.  . fluticasone (FLONASE) 50 MCG/ACT nasal spray Place 1 spray into both nostrils daily.  Marland Kitchen glucose blood test strip by Other route. Free Style Test Strips  . Homeopathic Products (SIMILASAN DRY EYE RELIEF OP) Apply 1 drop to eye 2 (two) times daily.  . montelukast (SINGULAIR) 10 MG tablet Take 1 tablet (10 mg total) by mouth at bedtime.  . RABEprazole (ACIPHEX) 20 MG tablet Take 1 tablet (20 mg total) by mouth daily.  . SUMAtriptan (IMITREX) 100 MG tablet Take 0.5-1 tablets (50-100 mg total) by mouth every 2 (two) hours as needed for migraine.  . topiramate (TOPAMAX) 50 MG tablet Take 3 tablets (150 mg total) by mouth at bedtime.  . [DISCONTINUED] fentaNYL (DURAGESIC) 50 MCG/HR Place  1 patch (50 mcg total) onto the skin every 3 (three) days.  . [DISCONTINUED] losartan (COZAAR) 50 MG tablet Take 2 tablets (100 mg total) by mouth daily.  . [DISCONTINUED] SUMAtriptan (IMITREX) 100 MG tablet TAKE ONE TABLET BY MOUTH AS NEEDED FOR MIGRAINE. MAY REPEAT IN TWO HOURS IF HEADACHE PERSISTS OR RECURS.    No Known Allergies     Review of Systems:  Constitutional: No recent illness  Cardiac: No  chest pain, No  pressure, No palpitations  Respiratory:  No  shortness of breath. No  Cough  Gastrointestinal: No  abdominal pain  Musculoskeletal: No new myalgia/arthralgia  Psychiatric: No  concerns with depression, No  concerns with anxiety  Exam:  BP 98/65 (BP Location: Left Arm,  Patient Position: Sitting, Cuff Size: Normal)   Pulse (!) 125   Temp (!) 97.5 F (36.4 C) (Oral)   Wt 187 lb 3.2 oz (84.9 kg)   BMI 35.37 kg/m   Constitutional: VS see above. General Appearance: alert, well-developed, well-nourished, NAD  Eyes: Normal lids and conjunctive, non-icteric sclera  Ears, Nose, Mouth, Throat: MMM, Normal external inspection ears/nares/mouth/lips/gums.  Neck: No masses, trachea midline.   Respiratory: Normal respiratory effort. no wheeze, no rhonchi, no rales  Cardiovascular: S1/S2 normal, no murmur, no rub/gallop auscultated. Tachycardic, regular    Musculoskeletal: Gait normal. Symmetric and independent movement of all extremities  Neurological: Normal balance/coordination. No tremor.  Skin: warm, dry, intact.   Psychiatric: Normal judgment/insight. Normal mood and affect. Oriented x3.       Visit summary with medication list and pertinent instructions was printed for patient to review, patient was advised to alert [place] if any updates are needed. All questions at time of visit were answered - patient instructed to contact office with any additional concerns. ER/RTC precautions were reviewed with the patient and understanding verbalized.  Please note: voice  recognition software was used to produce this document, and typos may escape review. Please contact Dr. Lyn Hollingshead for any needed clarifications.    Follow up plan: Return in about 3 months (around 01/15/2019) for follow-up A1C and maintain Fentanyl Rx .

## 2018-10-15 NOTE — Patient Instructions (Addendum)
Refilled 3 months of Fentanyl, prescriptions should be on file at the pharmacy, please call them when you are due for refills!  Sugars: can restart the Glyburide at half dose and will recheck A1C in 3 months.   Heart rate is high but EKG does not show an abnormal rhythm for this. I'd like to get some blood work (you're due anyway) to check for other possible causes. If you experience chest pain, short of breath, palpitations - please seek medical care!   Stop losartan Can restart metoprolol 50 mg

## 2018-10-15 NOTE — Telephone Encounter (Signed)
[facility] called stating that they are unable to process fentanyl rx as written. Sig directions must state "one patch on skin every 3 days." Currently written as "10". Pls send new rx to pharmacy. Thanks.

## 2018-10-16 MED ORDER — FENTANYL 50 MCG/HR TD PT72: 1.0000 | MEDICATED_PATCH | TRANSDERMAL | 0 refills | Status: AC

## 2018-10-16 MED ORDER — FENTANYL 50 MCG/HR TD PT72: 1.0000 | MEDICATED_PATCH | TRANSDERMAL | 0 refills | Status: DC

## 2018-11-26 ENCOUNTER — Other Ambulatory Visit: Payer: Self-pay | Admitting: Osteopathic Medicine

## 2018-11-26 DIAGNOSIS — M5136 Other intervertebral disc degeneration, lumbar region: Secondary | ICD-10-CM

## 2018-11-26 DIAGNOSIS — M5126 Other intervertebral disc displacement, lumbar region: Secondary | ICD-10-CM

## 2018-11-26 MED ORDER — FENTANYL 50 MCG/HR TD PT72: 1.0000 | MEDICATED_PATCH | TRANSDERMAL | 0 refills | Status: AC

## 2018-11-26 NOTE — Telephone Encounter (Signed)
Left a detailed vm msg for pt regarding med refill sent to local pharmacy. Direct call back info provided.  

## 2018-11-26 NOTE — Telephone Encounter (Signed)
[facility] pharmacy / pt requesting med refills for fentanyl patches.

## 2019-01-15 ENCOUNTER — Ambulatory Visit (INDEPENDENT_AMBULATORY_CARE_PROVIDER_SITE_OTHER): Payer: Medicare Other | Admitting: Osteopathic Medicine

## 2019-01-15 ENCOUNTER — Encounter: Payer: Self-pay | Admitting: Osteopathic Medicine

## 2019-01-15 VITALS — BP 95/63 | HR 77 | Temp 97.5°F | Wt 190.4 lb

## 2019-01-15 DIAGNOSIS — G894 Chronic pain syndrome: Secondary | ICD-10-CM

## 2019-01-15 DIAGNOSIS — E11649 Type 2 diabetes mellitus with hypoglycemia without coma: Secondary | ICD-10-CM

## 2019-01-15 LAB — POCT GLYCOSYLATED HEMOGLOBIN (HGB A1C): Hemoglobin A1C: 5.5 % (ref 4.0–5.6)

## 2019-01-15 MED ORDER — FENTANYL 50 MCG/HR TD PT72: 1.0000 | MEDICATED_PATCH | TRANSDERMAL | 0 refills | Status: AC

## 2019-01-15 MED ORDER — METOPROLOL SUCCINATE ER 50 MG PO TB24: 50.0000 mg | ORAL_TABLET | Freq: Every day | ORAL | 3 refills | Status: DC

## 2019-01-15 MED ORDER — METOPROLOL SUCCINATE ER 50 MG PO TB24: 50.0000 mg | ORAL_TABLET | Freq: Every day | ORAL | 0 refills | Status: DC

## 2019-01-15 MED ORDER — GLYBURIDE 1.25 MG PO TABS: 0.6250 mg | ORAL_TABLET | Freq: Every day | ORAL | 3 refills | Status: DC

## 2019-01-15 NOTE — Patient Instructions (Signed)
Plan:  Okay to refill the fentanyl early, let me know if there are any issues getting this done at the pharmacy.  I sent the dose of metoprolol to her local pharmacy as well for 30 days supply, the rest I [facility].  They should cancel the losartan.  I am okay to keep you on the glyburide but let us may be reduced the dose a little bit.  I sent 1.25 mg tablets (you have been taking 1.25 mg/day so far with half of the 2.5 mg tablets), take half tablet of these or 0.625 mg daily.

## 2019-01-15 NOTE — Progress Notes (Signed)
HPI: Teresa Medina is a 73 y.o. female who  has a past medical history of Arthritis, Chronic kidney disease (CKD) (11/29/2015), Chronic pain syndrome (06/23/2015), Degenerative disc disease, lumbar (06/23/2015), Depression, Diabetes mellitus without complication (HCC), Essential hypertension (06/02/2015), GERD (gastroesophageal reflux disease) (06/02/2015), Herniation of intervertebral disc between L4 and L5 (06/23/2015), History of vitamin D deficiency (06/02/2015), Hypertension, Levoscoliosis (06/23/2015), Migraine (06/02/2015), Osteoporosis (06/02/2015), Retrolisthesis of vertebrae (06/23/2015), Seasonal allergies (06/02/2015), Type 2 diabetes mellitus (HCC) (06/02/2015), and Type 2 diabetes with nephropathy (HCC) (11/29/2015).  [facility] today, 01/15/19,  for chief complaint of:  Pain medication refills  A1C follow-up   Chronic pain: Patient is on fentanyl patches chronically, doing well on these. PDMP reviewed and no concerns. Pt has controlled substance agreement on file updated today, 10/2018.  Requests early refill on medications as she will be traveling to go visit her daughter in [place].  Diabetes: A1c at last visit was elevated, we decided to restart the glyburide but patient has again noted some hypoglycemic episodes with this.     At today's visit 01/15/19 ... PMH, PSH, FH reviewed and updated as needed.  Current medication list and allergy/intolerance hx reviewed and updated as needed. (See remainder of HPI, ROS, Phys Exam below)   No results found.  Results for orders placed or performed in visit on 01/15/19 (from the past 72 hour(s))  POCT HgB A1C     Status: None   Collection Time: 01/15/19  3:44 PM  Result Value Ref Range   Hemoglobin A1C 5.5 4.0 - 5.6 %   HbA1c POC (<> result, manual entry)     HbA1c, POC (prediabetic range)     HbA1c, POC (controlled diabetic range)            ASSESSMENT/PLAN: The  primary encounter diagnosis was Type 2 diabetes mellitus with hypoglycemia without coma, without long-term current use of insulin (HCC). A diagnosis of Chronic pain syndrome was also pertinent to this visit.   Orders Placed This Encounter  Procedures  . POCT HgB A1C     Meds ordered this encounter  Medications  . DISCONTD: metoprolol succinate (TOPROL-XL) 50 MG 24 hr tablet    Sig: Take 1 tablet (50 mg total) by mouth daily. Take with or immediately following a meal.    Dispense:  30 tablet    Refill:  0  . metoprolol succinate (TOPROL-XL) 50 MG 24 hr tablet    Sig: Take 1 tablet (50 mg total) by mouth daily. Take with or immediately following a meal.    Dispense:  90 tablet    Refill:  3    PLEASE CANCEL LOSARTAN, THANKS!  . fentaNYL (DURAGESIC) 50 MCG/HR    Sig: Place 1 patch onto the skin every 3 (three) days for 30 days. Patient traveling - ok to fill early    Dispense:  10 patch    Refill:  0  . glyBURIDE (DIABETA) 1.25 MG tablet    Sig: Take 0.5 tablets (0.625 mg total) by mouth daily with breakfast.    Dispense:  45 tablet    Refill:  3    Patient Instructions  Plan:  Okay to refill the fentanyl early, let me know if there are any issues getting this done at the pharmacy.  I sent the dose of metoprolol to her local pharmacy as well for 30 days supply, the rest I sent to the pill pack.  They should cancel the losartan.  I am  okay to keep you on the glyburide but let us may be reduced the dose a little bit.  I sent 1.25 mg tablets (you have been taking 1.25 mg/day so far with half of the 2.5 mg tablets), take half tablet of these or 0.625 mg daily.         Follow-up plan: Return in about 3 months (around 04/17/2019) for medicare wellnes /annual physical with Dr. Lyn Hollingshead.  Labs prior to visit if desired.  .                                                 ################################################# ################################################# ################################################# #################################################    Current Meds  Medication Sig  . aspirin EC 81 MG tablet Take 81 mg by mouth daily.  . Biotin 16109 MCG TABS Take 10,000 mcg by mouth daily.  . Cholecalciferol (VITAMIN D-3) 1000 UNITS CAPS Take 2,000 Units by mouth daily.   . cyclobenzaprine (FLEXERIL) 10 MG tablet TAKE ONE TABLET BY MOUTH THREE TIMES DAILY  . DULoxetine (CYMBALTA) 30 MG capsule Take 3 capsules (90 mg total) by mouth daily.  . fluticasone (FLONASE) 50 MCG/ACT nasal spray Place 1 spray into both nostrils daily.  Marland Kitchen glucose blood test strip by Other route. Free Style Test Strips  . Homeopathic Products (SIMILASAN DRY EYE RELIEF OP) Apply 1 drop to eye 2 (two) times daily.  . metoprolol succinate (TOPROL-XL) 50 MG 24 hr tablet Take 1 tablet (50 mg total) by mouth daily. Take with or immediately following a meal.  . montelukast (SINGULAIR) 10 MG tablet Take 1 tablet (10 mg total) by mouth at bedtime.  . RABEprazole (ACIPHEX) 20 MG tablet Take 1 tablet (20 mg total) by mouth daily.  . SUMAtriptan (IMITREX) 100 MG tablet Take 0.5-1 tablets (50-100 mg total) by mouth every 2 (two) hours as needed for migraine.  . topiramate (TOPAMAX) 50 MG tablet Take 3 tablets (150 mg total) by mouth at bedtime.  . [DISCONTINUED] metoprolol succinate (TOPROL-XL) 50 MG 24 hr tablet Take 1 tablet (50 mg total) by mouth daily. Take with or immediately following a meal.  . [DISCONTINUED] metoprolol succinate (TOPROL-XL) 50 MG 24 hr tablet Take 1 tablet (50 mg total) by mouth daily. Take with or immediately following a meal.    No Known Allergies     Review of Systems:  Constitutional: No recent illness  HEENT: No  headache, no vision  change  Cardiac: No  chest pain, No  pressure, No palpitations  Respiratory:  No  shortness of breath. No  Cough  Gastrointestinal: No  abdominal pain  Musculoskeletal: No new myalgia/arthralgia  Skin: No  Rash  Neurologic: No  weakness, No  Dizziness  Psychiatric: No  concerns with depression, No  concerns with anxiety  Exam:  BP 95/63 (BP Location: Left Arm, Patient Position: Sitting, Cuff Size: Normal)   Pulse 77   Temp (!) 97.5 F (36.4 C) (Oral)   Wt 190 lb 6.4 oz (86.4 kg)   BMI 35.98 kg/m   Constitutional: VS see above. General Appearance: alert, well-developed, well-nourished, NAD  Eyes: Normal lids and conjunctive, non-icteric sclera  Ears, Nose, Mouth, Throat: MMM, Normal external inspection ears/nares/mouth/lips/gums.  Neck: No masses, trachea midline.   Respiratory: Normal respiratory effort.   Musculoskeletal: Gait normal, significant thoracic kyphosis, ambulates with cane.  Symmetric and independent movement of all extremities  Neurological: Normal balance/coordination. No tremor.  Skin: warm, dry, intact.   Psychiatric: Normal judgment/insight. Normal mood and affect. Oriented x3.       Visit summary with medication list and pertinent instructions was printed for patient to review, patient was advised to alert [place] if any updates are needed. All questions at time of visit were answered - patient instructed to contact office with any additional concerns. ER/RTC precautions were reviewed with the patient and understanding verbalized.   Note: Total time spent 25 minutes, greater than 50% of the visit was spent face-to-face counseling and coordinating care for the following: The primary encounter diagnosis was Type 2 diabetes mellitus with hypoglycemia without coma, without long-term current use of insulin (HCC). A diagnosis of Chronic pain syndrome was also pertinent to this visit.Marland Kitchen  Please note: voice recognition software was used to produce this  document, and typos may escape review. Please contact Dr. Lyn Hollingshead for any needed clarifications.    Follow up plan: Return in about 3 months (around 04/17/2019) for medicare wellnes /annual physical with Dr. Lyn Hollingshead.  Labs prior to visit if desired. Marland Kitchen

## 2019-03-16 ENCOUNTER — Other Ambulatory Visit: Payer: Self-pay | Admitting: Osteopathic Medicine

## 2019-03-16 DIAGNOSIS — Z8639 Personal history of other endocrine, nutritional and metabolic disease: Secondary | ICD-10-CM

## 2019-03-16 NOTE — Telephone Encounter (Signed)
Requested medications are due for refill today?  Yes  Requested medications are on the active medication list?  Yes  Last refill 11/26/2018  Future visit scheduled?  Yes - 05/05/2019  Notes to clinic   Requested Prescriptions  Pending Prescriptions Disp Refills   cyclobenzaprine (FLEXERIL) 10 MG tablet [Pharmacy Med Name: cyclobenzaprine 10 mg tablet] 90 tablet 0    Sig: TAKE ONE TABLET BY MOUTH THREE TIMES DAILY     Not Delegated - Analgesics:  Muscle Relaxants Failed - 03/16/2019 10:01 AM      Failed - This refill cannot be delegated      Passed - Valid encounter within last 6 months    Recent Outpatient Visits          2 months ago Type 2 diabetes mellitus with hypoglycemia without coma, without long-term current use of insulin (HCC)   [place] [facility] [place], Dorene GrebeNatalie, DO   5 months ago Degenerative disc disease, lumbar   [place] [facility] [place] [facility], Dorene GrebeNatalie, DO   7 months ago Hypotension due to drugs   [facility] [place], Jade L, PA-C   7 months ago Hypotension due to drugs   [facility] [place], Dorene GrebeNatalie, DO   8 months ago Type 2 diabetes mellitus with hypoglycemia without coma, without long-term current use of insulin [facility](HCC)   [place] [facility] [place], Dorene GrebeNatalie, DO      Future Appointments            In 1 month  [place] [facility] [place]   In 2 months Sunnie NielsenAlexander, Natalie, DO [place] [facility] [place]           Signed Prescriptions Disp Refills   SUMAtriptan (IMITREX) 100 MG tablet 30 tablet 0    Sig: TAKE 1/2 TO 1 TABLET BY MOUTH EVERY 2 HOURS AS NEEDED     Neurology:  Migraine Therapy - Triptan Passed - 03/16/2019 10:01 AM      Passed - Last BP in normal range    BP Readings from Last 1 Encounters:  01/15/19 95/63         Passed - Valid encounter within  last 12 months    Recent Outpatient Visits          2 months ago Type 2 diabetes mellitus with hypoglycemia without coma, without long-term current use of insulin (HCC)   [place] [facility] [place], Dorene GrebeNatalie, DO   5 months ago Degenerative disc disease, lumbar   [place] [facility] [place], Dorene GrebeNatalie, DO   7 months ago Hypotension due to drugs   [facility] [place], Lonna CobbJade L, PA-C   7 months ago Hypotension due to drugs   [facility] [place], Dorene GrebeNatalie, DO   8 months ago Type 2 diabetes mellitus with hypoglycemia without coma, without long-term current use of insulin [facility](HCC)   [place] [facility] [place], Dorene GrebeNatalie, DO      Future Appointments            In 1 month  [place] [facility] [place]   In 2 months Sunnie NielsenAlexander, Natalie, DO [place] [facility]            Refused Prescriptions Disp Refills   metoprolol succinate (TOPROL-XL)  50 MG 24 hr tablet [Pharmacy Med Name: metoprolol succinate ER 50 mg tablet,extended release 24 hr] 30 tablet     Sig: Take 1 tablet (50 mg total) by mouth daily. Take with or immediately following a meal.     Cardiovascular:  Beta Blockers Passed - 03/16/2019 10:01 AM      Passed - Last BP in normal range    BP Readings from Last 1 Encounters:  01/15/19 95/63         Passed - Last Heart Rate in normal range    Pulse Readings from Last 1 Encounters:  01/15/19 77         Passed - Valid encounter within last 6 months    Recent Outpatient Visits          2 months ago Type 2 diabetes mellitus with hypoglycemia without coma, without long-term current use of insulin ([place])   [place] [facility], Lanelle Bal, DO   5 months ago Degenerative disc disease, lumbar   [place] [facility],  Lanelle Bal, DO   7 months ago Hypotension due to drugs   [facility], Royetta Car, PA-C   7 months ago Hypotension due to drugs   [place], Lanelle Bal, DO   8 months ago Type 2 diabetes mellitus with hypoglycemia without coma, without long-term current use of insulin [facility])   [place] [facility], Lanelle Bal, DO      Future Appointments            In 1 month  [place]   In 2 months Emeterio Reeve, [place]

## 2019-04-05 ENCOUNTER — Other Ambulatory Visit: Payer: Self-pay | Admitting: Osteopathic Medicine

## 2019-04-05 DIAGNOSIS — G894 Chronic pain syndrome: Secondary | ICD-10-CM

## 2019-04-06 NOTE — Telephone Encounter (Signed)
Please advise 

## 2019-04-07 ENCOUNTER — Other Ambulatory Visit: Payer: Self-pay | Admitting: Osteopathic Medicine

## 2019-04-07 DIAGNOSIS — M51369 Other intervertebral disc degeneration, lumbar region without mention of lumbar back pain or lower extremity pain: Secondary | ICD-10-CM

## 2019-04-07 DIAGNOSIS — M5136 Other intervertebral disc degeneration, lumbar region: Secondary | ICD-10-CM

## 2019-04-07 DIAGNOSIS — M5126 Other intervertebral disc displacement, lumbar region: Secondary | ICD-10-CM

## 2019-04-07 NOTE — Telephone Encounter (Signed)
Requested medication (s) are due for refill today: no  Requested medication (s) are on the active medication list: no  Last refill:  01/15/2019  Future visit scheduled: yes  Notes to clinic:  This refill can not be delegated   Requested Prescriptions  Pending Prescriptions Disp Refills   fentaNYL ([place]) 50 MCG/HR [Pharmacy Med Name: fentanyl 50 mcg/hr transdermal patch] 10 patch 0    Sig: Place 1 patch onto the skin every 3 (three) days for 30 days.     Not Delegated - Analgesics:  Opioid Agonists Failed - 04/07/2019  9:26 AM      Failed - This refill cannot be delegated      Failed - Urine Drug Screen completed in last 360 days.      Passed - Valid encounter within last 6 months    Recent Outpatient Visits          2 months ago Type 2 diabetes mellitus with hypoglycemia without coma, without long-term current use of insulin [facility])   [employer] [facility], Lanelle Bal, DO   5 months ago Degenerative disc disease, lumbar   [place] [facility], Lanelle Bal, DO   7 months ago Hypotension due to drugs   [facility], Royetta Car, PA-C   8 months ago Hypotension due to drugs   [place], Lanelle Bal, DO   8 months ago Type 2 diabetes mellitus with hypoglycemia without coma, without long-term current use of insulin [facility])   [place] [facility], Lanelle Bal, DO      Future Appointments            In 4 weeks  [place]   In 1 month Emeterio Reeve, [place]

## 2019-04-16 ENCOUNTER — Other Ambulatory Visit: Payer: Self-pay | Admitting: Osteopathic Medicine

## 2019-04-22 NOTE — Progress Notes (Deleted)
Subjective:   Teresa Medina is a 73 y.o. female who presents for Medicare Annual (Subsequent) preventive examination.  Review of Systems:  No ROS.  Medicare Wellness Virtual Visit.  Visual/audio telehealth visit, UTA vital signs.   See social history for additional risk factors.      Sleep patterns:  Home Safety/Smoke Alarms: Feels safe in home. Smoke alarms in place.  Living environment; Seat Belt Safety/Bike Helmet: Wears seat belt.   Female:   Pap- Aged out      Mammo-       Dexa scan-  UTD      CCS-       Objective:     Vitals: There were no vitals taken for this visit.  There is no height or weight on file to calculate BMI.  Advanced Directives 12/14/2017 04/25/2017  Does Patient Have a Medical Advance Directive? Yes No  Type of Advance Directive Living will -  Does patient want to make changes to medical advance directive? No - Patient declined -  Would patient like information on creating a medical advance directive? - (No Data)    Tobacco Social History   Tobacco Use  Smoking Status Never Smoker  Smokeless Tobacco Never Used     Counseling given: Not Answered   Clinical Intake:                       Past Medical History:  Diagnosis Date  . Arthritis   . Chronic kidney disease (CKD) 11/29/2015   next visit: get UA, Phos, Vit D, PTH, CMP   . Chronic pain syndrome 06/23/2015   Records reviewed: pt has been on Fentanyl TD, Cymbalta, Flexeril  . Degenerative disc disease, lumbar 06/23/2015   MRI 06/2014 most severe L4-5  . Depression   . Diabetes mellitus without complication (HCC)   . Essential hypertension 06/02/2015  . GERD (gastroesophageal reflux disease) 06/02/2015  . Herniation of intervertebral disc between L4 and L5 06/23/2015   MRI 06/2014 moderate to severe R foraminal narrowing  . History of vitamin D deficiency 06/02/2015  . Hypertension   . Levoscoliosis 06/23/2015   Mild, MRI 06/22/14  . Migraine 06/02/2015  .  Osteoporosis 06/02/2015  . Retrolisthesis of vertebrae 06/23/2015   Gr1 L1 on L2 MRI 06/2014  . Seasonal allergies 06/02/2015  . Type 2 diabetes mellitus (HCC) 06/02/2015  . Type 2 diabetes with nephropathy (HCC) 11/29/2015   DIABETES SCREENING/PREVENTIVE CARE: Updated 03/05/16  A1C past 3-6 mos: Yes, 03/05/16 6.6% controlled? Yes BP goal <140/90: Yes  LDL goal <70: no - 100 11/24/15 Eye exam annually: Yes , importance discussed with patient Foot exam:  08/31/15 Microalbuminuria:   not applicable, patient is on ACE/ARB Metformin: No - declines ACE/ARB: Yes  Antiplatelet if ASCVD Risk >10%: Recommended Statin: No    Past Surgical History:  Procedure Laterality Date  . BUNIONECTOMY Left 1993  . FOOT SURGERY  2008  . FOOT SURGERY  2003  . NECK SURGERY  1993  . REPLACEMENT TOTAL KNEE Bilateral 1999   R in Jan, L in Apr  . TONSILLECTOMY  1952   Family History  Problem Relation Age of Onset  . Diabetes Sister   . Heart Problems Sister        multiple bypass surgeries  . Cancer Sister   . Depression Brother   . Heart Problems Brother        bad heart use oxygen  . Coronary artery disease Mother   .  Migraines Mother   . Pulmonary fibrosis Father   . Pneumonia Father   . Migraines Father   . Breast cancer Maternal Aunt    Social History   Socioeconomic History  . Marital status: Widowed    Spouse name: Not on file  . Number of children: 2  . Years of education: 61  . Highest education level: Not on file  Occupational History  . Occupation: N/A  Social Needs  . Financial resource strain: Not on file  . Food insecurity    Worry: Not on file    Inability: Not on file  . Transportation needs    Medical: Not on file    Non-medical: Not on file  Tobacco Use  . Smoking status: Never Smoker  . Smokeless tobacco: Never Used  Substance and Sexual Activity  . Alcohol use: No    Alcohol/week: 0.0 standard drinks  . Drug use: No  . Sexual activity: Not on file  Lifestyle  .  Physical activity    Days per week: Not on file    Minutes per session: Not on file  . Stress: Not on file  Relationships  . Social Herbalist on phone: Not on file    Gets together: Not on file    Attends religious service: Not on file    Active member of club or organization: Not on file    Attends meetings of clubs or organizations: Not on file    Relationship status: Not on file  Other Topics Concern  . Not on file  Social History Narrative   Lives at home w/ her son and his wife   Right-handed   Caffeine: 1 cup of coffee per day    Outpatient Encounter Medications as of 04/28/2019  Medication Sig  . DULoxetine (CYMBALTA) 30 MG capsule Take 3 capsules (90 mg total) by mouth daily.  . montelukast (SINGULAIR) 10 MG tablet Take 1 tablet (10 mg total) by mouth at bedtime.  . RABEprazole (ACIPHEX) 20 MG tablet Take 1 tablet (20 mg total) by mouth daily.  Marland Kitchen topiramate (TOPAMAX) 50 MG tablet Take 3 tablets (150 mg total) by mouth at bedtime.  Marland Kitchen aspirin EC 81 MG tablet Take 81 mg by mouth daily.  . Biotin 10000 MCG TABS Take 10,000 mcg by mouth daily.  . Cholecalciferol (VITAMIN D-3) 1000 UNITS CAPS Take 2,000 Units by mouth daily.   . cyclobenzaprine (FLEXERIL) 10 MG tablet TAKE ONE TABLET BY MOUTH THREE TIMES DAILY  . fentaNYL (DURAGESIC) 50 MCG/HR Place 1 patch onto the skin every 3 (three) days for 30 days.  . fluticasone (FLONASE) 50 MCG/ACT nasal spray Place 1 spray into both nostrils daily.  Marland Kitchen glucose blood test strip by Other route. Free Style Test Strips  . glyBURIDE (DIABETA) 1.25 MG tablet Take 0.5 tablets (0.625 mg total) by mouth daily with breakfast.  . Homeopathic Products (SIMILASAN DRY EYE RELIEF OP) Apply 1 drop to eye 2 (two) times daily.  . metoprolol succinate (TOPROL-XL) 50 MG 24 hr tablet TAKE ONE TABLET BY MOUTH EVERY DAY. TAKE WITH OR IMMEDIATELY FOLLOWING A MEAL  . SUMAtriptan (IMITREX) 100 MG tablet TAKE 1/2 TO 1 TABLET BY MOUTH EVERY 2 HOURS AS  NEEDED    No facility-administered encounter medications on file as of 04/28/2019.     Activities of Daily Living No flowsheet data found.  Patient Care Team: Emeterio Reeve, DO as PCP - General (Osteopathic Medicine)    Assessment:   This  is a routine wellness examination for Bristol Ambulatory Surger CenterMargaret.Physical assessment deferred to PCP.   Exercise Activities and Dietary recommendations   Diet  Breakfast: Lunch:  Dinner:       Goals   None     Fall Risk Fall Risk  11/29/2015  Falls in the past year? No   Is the patient's home free of loose throw rugs in walkways, pet beds, electrical cords, etc?   {Blank single:19197::"yes","no"}      Grab bars in the bathroom? {Blank single:19197::"yes","no"}      Handrails on the stairs?   {Blank single:19197::"yes","no"}      Adequate lighting?   {Blank single:19197::"yes","no"}   Depression Screen PHQ 2/9 Scores 07/16/2018 04/03/2018 01/03/2017 11/29/2015  PHQ - 2 Score - 0 0 0  PHQ- 9 Score - 2 0 -  Exception Documentation Patient refusal - - -     Cognitive Function        Immunization History  Administered Date(s) Administered  . Influenza, High Dose Seasonal PF 07/16/2018  . Influenza,inj,Quad PF,6+ Mos 09/05/2016  . Influenza-Unspecified 04/28/2015  . PPD Test 12/16/2017  . Pneumococcal Conjugate-13 06/04/2014  . Pneumococcal Polysaccharide-23 09/20/2010  . Tdap 11/29/2015    Screening Tests Health Maintenance  Topic Date Due  . COLONOSCOPY  09/03/1994  . URINE MICROALBUMIN  06/06/2015  . FOOT EXAM  08/30/2016  . OPHTHALMOLOGY EXAM  09/03/2018  . MAMMOGRAM  09/25/2018  . INFLUENZA VACCINE  03/14/2019  . HEMOGLOBIN A1C  07/17/2019  . TETANUS/TDAP  11/28/2025  . DEXA SCAN  Completed  . PNA vac Low Risk Adult  Completed  . Hepatitis C Screening  Addressed        Plan:   ***   I have personally reviewed and noted the following in the patient's chart:   . Medical and social history . Use of alcohol, tobacco or  illicit drugs  . Current medications and supplements . Functional ability and status . Nutritional status . Physical activity . Advanced directives . List of other physicians . Hospitalizations, surgeries, and ER visits in previous 12 months . Vitals . Screenings to include cognitive, depression, and falls . Referrals and appointments  In addition, I have reviewed and discussed with patient certain preventive protocols, quality metrics, and best practice recommendations. A written personalized care plan for preventive services as well as general preventive health recommendations were provided to patient.     Normand SloopKimberly A , LPN  1/6/10969/04/2019

## 2019-04-28 ENCOUNTER — Other Ambulatory Visit: Payer: Self-pay

## 2019-04-28 ENCOUNTER — Ambulatory Visit (INDEPENDENT_AMBULATORY_CARE_PROVIDER_SITE_OTHER): Payer: Medicare Other | Admitting: *Deleted

## 2019-04-28 ENCOUNTER — Ambulatory Visit: Payer: Medicare Other

## 2019-04-28 VITALS — Ht 61.0 in | Wt 190.0 lb

## 2019-04-28 DIAGNOSIS — Z780 Asymptomatic menopausal state: Secondary | ICD-10-CM

## 2019-04-28 DIAGNOSIS — Z1239 Encounter for other screening for malignant neoplasm of breast: Secondary | ICD-10-CM | POA: Diagnosis not present

## 2019-04-28 NOTE — Progress Notes (Signed)
Subjective:   Teresa Medina is a 73 y.o. female who presents for Medicare Annual (Subsequent) preventive examination.  Review of Systems:  No ROS.  Medicare Wellness Virtual Visit.  Visual/audio telehealth visit, UTA vital signs.   See social history for additional risk factors.    Cardiac Risk Factors include: advanced age (>6men, >53 women);diabetes mellitus;sedentary lifestyle;family history of premature cardiovascular disease;hypertension Sleep patterns:  Getting on average of 5-6 hours of sleep a night. Wakes up occasionally to go to void. Wakes up and feels refreshed  Home Safety/Smoke Alarms: Feels safe in home. Smoke alarms in place.  Living environment; Lives with son and daughter-in-law in a 2 story home. STairs have hand rails on them. Shower is a walk in shower and grab bars in place. Seat Belt Safety/Bike Helmet: Wears seat belt.   Female:   Pap- Aged out      Mammo- will schedule     Dexa scan-  UTD      CCS- doing cologuard     Objective:     Vitals: There were no vitals taken for this visit.  There is no height or weight on file to calculate BMI.  Advanced Directives 04/28/2019 12/14/2017 04/25/2017  Does Patient Have a Medical Advance Directive? Yes Yes No  Type of Estate agent of [place];Living will Living will -  Does patient want to make changes to medical advance directive? No - Patient declined No - Patient declined -  Copy of Healthcare Power of Attorney in Chart? No - copy requested - -  Would patient like information on creating a medical advance directive? - - (No Data)    Tobacco Social History   Tobacco Use  Smoking Status Never Smoker  Smokeless Tobacco Never Used     Counseling given: Not Answered   Clinical Intake:  Pre-visit preparation completed: Yes  Pain : 0-10 Pain Score: 8  Pain Type: Chronic pain Pain Location: Back Pain Orientation: Mid Pain Descriptors / Indicators: Stabbing, Throbbing Pain  Onset: More than a month ago Pain Frequency: Constant Pain Relieving Factors: meds Effect of Pain on Daily Activities: effect all her ADL's  Pain Relieving Factors: meds  Nutritional Risks: None Diabetes: Yes CBG done?: No(FBS at home 72) Did pt. bring in CBG monitor from home?: No  How often do you need to have someone help you when you read instructions, pamphlets, or other written materials from your doctor or pharmacy?: 1 - Never What is the last grade level you completed in school?: 12  Interpreter Needed?: No  Information entered by :: Teresa Sicks, LPN  Past Medical History:  Diagnosis Date  . Arthritis   . Chronic kidney disease (CKD) 11/29/2015   next visit: get UA, Phos, Vit D, PTH, CMP   . Chronic pain syndrome 06/23/2015   Records reviewed: pt has been on Fentanyl TD, Cymbalta, Flexeril  . Degenerative disc disease, lumbar 06/23/2015   MRI 06/2014 most severe L4-5  . Depression   . Diabetes mellitus without complication (HCC)   . Essential hypertension 06/02/2015  . GERD (gastroesophageal reflux disease) 06/02/2015  . Herniation of intervertebral disc between L4 and L5 06/23/2015   MRI 06/2014 moderate to severe R foraminal narrowing  . History of vitamin D deficiency 06/02/2015  . Hypertension   . Levoscoliosis 06/23/2015   Mild, MRI 06/22/14  . Migraine 06/02/2015  . Osteoporosis 06/02/2015  . Retrolisthesis of vertebrae 06/23/2015   Gr1 L1 on L2 MRI 06/2014  . Seasonal allergies 06/02/2015  .  Type 2 diabetes mellitus ([place]) 06/02/2015  . Type 2 diabetes with nephropathy ([place]) 11/29/2015   DIABETES SCREENING/PREVENTIVE CARE: Updated 03/05/16  A1C past 3-6 mos: Yes, 03/05/16 6.6% controlled? Yes BP goal <140/90: Yes  LDL goal <70: no - 100 11/24/15 Eye exam annually: Yes , importance discussed with patient Foot exam:  08/31/15 Microalbuminuria:   not applicable, patient is on ACE/ARB Metformin: No - declines ACE/ARB: Yes  Antiplatelet if ASCVD Risk >10%:  Recommended Statin: No    Past Surgical History:  Procedure Laterality Date  . BUNIONECTOMY Left 1993  . FOOT SURGERY  2008  . FOOT SURGERY  2003  . NECK SURGERY  1993  . REPLACEMENT TOTAL KNEE Bilateral 1999   R in Jan, L in Apr  . TONSILLECTOMY  1952   Family History  Problem Relation Age of Onset  . Diabetes Sister   . Heart Problems Sister        multiple bypass surgeries  . Cancer Sister   . Depression Brother   . Heart Problems Brother        bad heart use oxygen  . Coronary artery disease Mother   . Migraines Mother   . Pulmonary fibrosis Father   . Pneumonia Father   . Migraines Father   . Breast cancer Maternal Aunt    Social History   Socioeconomic History  . Marital status: Widowed    Spouse name: Not on file  . Number of children: 2  . Years of education: 45  . Highest education level: GED or equivalent  Occupational History  . 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 Financial resource strain: Not hard at all  . Food insecurity    Worry: Never true    Inability: Never true  . Transportation needs    Medical: No    Non-medical: No  Tobacco Use  . Smoking status: Never Smoker  . Smokeless tobacco: Never Used  Substance and Sexual Activity  . Alcohol use: No    Alcohol/week: 0.0 standard drinks  . Drug use: No  . Sexual activity: Not Currently  Lifestyle  . Physical activity    Days per week: 0 days    Minutes per session: 0 min  . Stress: Not at all  Relationships  . Social Herbalist on phone: Once a week    Gets together: More than three times a week    Attends religious service: 1 to 4 times per year    Active member of club or organization: No    Attends meetings of clubs or organizations: Never    Relationship status: Widowed  Other Topics Concern  . Not on file  Social History Narrative   Lives at home w/ her son and his wife   Right-handed   Caffeine: 1 cup of coffee per day    Outpatient  Encounter Medications as of 04/28/2019  Medication Sig  . aspirin EC 81 MG tablet Take 81 mg by mouth daily.  . Biotin 10000 MCG TABS Take 10,000 mcg by mouth daily.  . Cholecalciferol (VITAMIN D-3) 1000 UNITS CAPS Take 2,000 Units by mouth daily.   . cyclobenzaprine (FLEXERIL) 10 MG tablet TAKE ONE TABLET BY MOUTH THREE TIMES DAILY  . DULoxetine (CYMBALTA) 30 MG capsule Take 3 capsules (90 mg total) by mouth daily.  . fentaNYL (DURAGESIC) 50 MCG/HR Place 1 patch onto the skin every 3 (three) days for 30 days.  . fluticasone (FLONASE)  50 MCG/ACT nasal spray Place 1 spray into both nostrils daily.  Marland Kitchen. glucose blood test strip by Other route. Free Style Test Strips  . glyBURIDE (DIABETA) 1.25 MG tablet Take 0.5 tablets (0.625 mg total) by mouth daily with breakfast.  . Homeopathic Products (SIMILASAN DRY EYE RELIEF OP) Apply 1 drop to eye 2 (two) times daily.  . metoprolol succinate (TOPROL-XL) 50 MG 24 hr tablet TAKE ONE TABLET BY MOUTH EVERY DAY. TAKE WITH OR IMMEDIATELY FOLLOWING A MEAL  . montelukast (SINGULAIR) 10 MG tablet Take 1 tablet (10 mg total) by mouth at bedtime.  . RABEprazole (ACIPHEX) 20 MG tablet Take 1 tablet (20 mg total) by mouth daily.  . SUMAtriptan (IMITREX) 100 MG tablet TAKE 1/2 TO 1 TABLET BY MOUTH EVERY 2 HOURS AS NEEDED   . topiramate (TOPAMAX) 50 MG tablet Take 3 tablets (150 mg total) by mouth at bedtime.   No facility-administered encounter medications on file as of 04/28/2019.     Activities of Daily Living In your present state of health, do you have any difficulty performing the following activities: 04/28/2019  Hearing? N  Vision? N  Difficulty concentrating or making decisions? N  Walking or climbing stairs? Y  Dressing or bathing? N  Doing errands, shopping? N  Preparing Food and eating ? N  Using the Toilet? N  In the past six months, have you accidently leaked urine? Y  Comment wears a pad since age of 73  Do you have problems with loss of bowel  control? Y  Comment constipation due to meds  Managing your Medications? N  Managing your Finances? N  Housekeeping or managing your Housekeeping? N  Some recent data might be hidden    Patient Care Team: Sunnie NielsenAlexander, Natalie, DO as PCP - General (Osteopathic Medicine)    Assessment:   This is a routine wellness examination [facility].Physical assessment deferred to PCP.   Exercise Activities and Dietary recommendations Current Exercise Habits: The patient does not participate in regular exercise at present, Exercise limited by: orthopedic condition(s) Diet Eats  A lot of take out or fast food. Breakfast: none just coffee Lunch: sandwich Dinner:  Meat and vegetable      Goals    . Patient Stated     Patient states would like to be able to walk more without pain       Fall Risk Fall Risk  04/28/2019 11/29/2015  Falls in the past year? 1 No  Number falls in past yr: 0 -  Injury with Fall? 0 -  Risk for fall due to : Impaired balance/gait;Impaired mobility -  Follow up Falls prevention discussed -   Is the patient's home free of loose throw rugs in walkways, pet beds, electrical cords, etc?   yes      Grab bars in the bathroom? yes      Handrails on the stairs?   yes      Adequate lighting?   yes  Depression Screen PHQ 2/9 Scores 04/28/2019 07/16/2018 04/03/2018 01/03/2017  PHQ - 2 Score 0 - 0 0  PHQ- 9 Score - - 2 0  Exception Documentation - Patient refusal - -     Cognitive Function     6CIT Screen 04/28/2019  What Year? 0 points  What month? 0 points  What time? 0 points  Count back from 20 0 points  Months in reverse 0 points  Repeat phrase 0 points  Total Score 0    Immunization History  Administered  Date(s) Administered  . Influenza, High Dose Seasonal PF 07/16/2018  . Influenza,inj,Quad PF,6+ Mos 09/05/2016  . Influenza-Unspecified 04/28/2015  . PPD Test 12/16/2017  . Pneumococcal Conjugate-13 06/04/2014  . Pneumococcal Polysaccharide-23 09/20/2010   . Tdap 11/29/2015     Screening Tests Health Maintenance  Topic Date Due  . COLONOSCOPY  09/03/1994  . URINE MICROALBUMIN  06/06/2015  . FOOT EXAM  08/30/2016  . OPHTHALMOLOGY EXAM  09/03/2018  . MAMMOGRAM  09/25/2018  . INFLUENZA VACCINE  03/14/2019  . HEMOGLOBIN A1C  07/17/2019  . TETANUS/TDAP  11/28/2025  . DEXA SCAN  Completed  . PNA vac Low Risk Adult  Completed  . Hepatitis C Screening  Addressed        Plan:    Teresa Medina , Thank you for taking time to come for your Medicare Wellness Visit. I appreciate your ongoing commitment to your health goals. Please review the following plan we discussed and let me know if I can assist you in the future.  Please schedule your next medicare wellness visit with me in 1 yr. Continue doing brain stimulating activities (puzzles, reading, adult coloring books, staying active) to keep memory sharp.   These are the goals we discussed: Goals    . Patient Stated     Patient states would like to be able to walk more without pain       This is a list of the screening recommended for you and due dates:  Health Maintenance  Topic Date Due  . Colon Cancer Screening  09/03/1994  . Urine Protein Check  06/06/2015  . Complete foot exam   08/30/2016  . Eye exam for diabetics  09/03/2018  . Mammogram  09/25/2018  . Flu Shot  03/14/2019  . Hemoglobin A1C  07/17/2019  . Tetanus Vaccine  11/28/2025  . DEXA scan (bone density measurement)  Completed  . Pneumonia vaccines  Completed  .  Hepatitis C: One time screening is recommended by Center for Disease Control  (CDC) for  adults born from [birth year]945 through 1966.   Addressed     I have personally reviewed and noted the following in the patient's chart:   . Medical and social history . Use of alcohol, tobacco or illicit drugs  . Current medications and supplements . Functional ability and status . Nutritional status . Physical activity . Advanced directives . List of other  physicians . Hospitalizations, surgeries, and ER visits in previous 12 months . Vitals . Screenings to include cognitive, depression, and falls . Referrals and appointments  In addition, I have reviewed and discussed with patient certain preventive protocols, quality metrics, and best practice recommendations. A written personalized care plan for preventive services as well as general preventive health recommendations were provided to patient.     Normand SloopKimberly A Jolina Symonds, LPN  1/61/09609/15/2020

## 2019-04-28 NOTE — Patient Instructions (Signed)
Teresa Medina , Thank you for taking time to come for your Medicare Wellness Visit. I appreciate your ongoing commitment to your health goals. Please review the following plan we discussed and let me know if I can assist you in the future.  Please schedule your next medicare wellness visit with me in 1 yr. Continue doing brain stimulating activities (puzzles, reading, adult coloring books, staying active) to keep memory sharp.   These are the goals we discussed: Goals    . Patient Stated     Patient states would like to be able to walk more without pain

## 2019-05-05 ENCOUNTER — Ambulatory Visit: Payer: Medicare Other

## 2019-05-05 ENCOUNTER — Encounter: Payer: Medicare Other | Admitting: Osteopathic Medicine

## 2019-05-08 ENCOUNTER — Other Ambulatory Visit: Payer: Self-pay | Admitting: Sports Medicine

## 2019-05-08 ENCOUNTER — Other Ambulatory Visit: Payer: Self-pay | Admitting: Osteopathic Medicine

## 2019-05-08 DIAGNOSIS — M5136 Other intervertebral disc degeneration, lumbar region: Secondary | ICD-10-CM

## 2019-05-08 DIAGNOSIS — M5126 Other intervertebral disc displacement, lumbar region: Secondary | ICD-10-CM

## 2019-05-08 DIAGNOSIS — M51369 Other intervertebral disc degeneration, lumbar region without mention of lumbar back pain or lower extremity pain: Secondary | ICD-10-CM

## 2019-05-08 MED ORDER — FENTANYL 50 MCG/HR TD PT72: 1.0000 | MEDICATED_PATCH | TRANSDERMAL | 0 refills | Status: DC

## 2019-05-08 NOTE — Telephone Encounter (Signed)
Requested medication (s) are due for refill today: yes  Requested medication (s) are on the active medication list: yes  Last refill:  04/07/2019  Future visit scheduled: yes  Notes to clinic:  Refill cannot be delegated    Requested Prescriptions  Pending Prescriptions Disp Refills   fentaNYL ([place]) 50 MCG/HR [Pharmacy Med Name: fentanyl 50 mcg/hr transdermal patch] 10 patch 0    Sig: [facility] [place]     Not Delegated - Analgesics:  Opioid Agonists Failed - 05/08/2019  9:54 AM      Failed - This refill cannot be delegated      Failed - Urine Drug Screen completed in last 360 days.      Passed - Valid encounter within last 6 months    Recent Outpatient Visits          3 months ago Type 2 diabetes mellitus with hypoglycemia without coma, without long-term current use of insulin [facility])   [facility] [facility] [facility], Lanelle Bal, DO   6 months ago Degenerative disc disease, lumbar   [place] [facility], Lanelle Bal, DO   8 months ago Hypotension due to drugs   [facility], Royetta Car, PA-C   9 months ago Hypotension due to drugs   [facility], Lanelle Bal, DO   9 months ago Type 2 diabetes mellitus with hypoglycemia without coma, without long-term current use of insulin [facility])    [facility], Lanelle Bal, DO      Future Appointments            In 1 week Emeterio Reeve, DO [facility]

## 2019-05-08 NOTE — Telephone Encounter (Signed)
[facility] pharmacy requesting med refill for fentanyl transdermal patch.

## 2019-05-19 ENCOUNTER — Ambulatory Visit (INDEPENDENT_AMBULATORY_CARE_PROVIDER_SITE_OTHER): Payer: Medicare Other | Admitting: Osteopathic Medicine

## 2019-05-19 ENCOUNTER — Other Ambulatory Visit: Payer: Self-pay

## 2019-05-19 ENCOUNTER — Encounter: Payer: Self-pay | Admitting: Osteopathic Medicine

## 2019-05-19 VITALS — BP 133/72 | HR 79 | Temp 97.7°F | Wt 193.9 lb

## 2019-05-19 DIAGNOSIS — Z23 Encounter for immunization: Secondary | ICD-10-CM

## 2019-05-19 DIAGNOSIS — E11649 Type 2 diabetes mellitus with hypoglycemia without coma: Secondary | ICD-10-CM | POA: Diagnosis not present

## 2019-05-19 DIAGNOSIS — M5136 Other intervertebral disc degeneration, lumbar region: Secondary | ICD-10-CM

## 2019-05-19 DIAGNOSIS — G894 Chronic pain syndrome: Secondary | ICD-10-CM

## 2019-05-19 DIAGNOSIS — E559 Vitamin D deficiency, unspecified: Secondary | ICD-10-CM | POA: Diagnosis not present

## 2019-05-19 DIAGNOSIS — M5126 Other intervertebral disc displacement, lumbar region: Secondary | ICD-10-CM | POA: Diagnosis not present

## 2019-05-19 DIAGNOSIS — Z8639 Personal history of other endocrine, nutritional and metabolic disease: Secondary | ICD-10-CM

## 2019-05-19 LAB — POCT GLYCOSYLATED HEMOGLOBIN (HGB A1C): Hemoglobin A1C: 5.8 % — AB (ref 4.0–5.6)

## 2019-05-19 MED ORDER — TOPIRAMATE 50 MG PO TABS: 150.0000 mg | ORAL_TABLET | Freq: Every day | ORAL | 3 refills | Status: DC

## 2019-05-19 MED ORDER — RABEPRAZOLE SODIUM 20 MG PO TBEC: 20.0000 mg | DELAYED_RELEASE_TABLET | Freq: Every day | ORAL | 3 refills | Status: DC

## 2019-05-19 MED ORDER — METOPROLOL SUCCINATE ER 50 MG PO TB24: ORAL_TABLET | ORAL | 0 refills | Status: DC

## 2019-05-19 MED ORDER — METOPROLOL SUCCINATE ER 50 MG PO TB24: ORAL_TABLET | ORAL | 3 refills | Status: DC

## 2019-05-19 MED ORDER — ASPIRIN EC 81 MG PO TBEC: 81.0000 mg | DELAYED_RELEASE_TABLET | Freq: Every day | ORAL | 3 refills | Status: DC

## 2019-05-19 MED ORDER — MONTELUKAST SODIUM 10 MG PO TABS: 10.0000 mg | ORAL_TABLET | Freq: Every day | ORAL | 3 refills | Status: DC

## 2019-05-19 MED ORDER — FENTANYL 50 MCG/HR TD PT72: 1.0000 | MEDICATED_PATCH | TRANSDERMAL | 0 refills | Status: DC

## 2019-05-19 MED ORDER — SUMATRIPTAN SUCCINATE 100 MG PO TABS: ORAL_TABLET | ORAL | 0 refills | Status: DC

## 2019-05-19 MED ORDER — DULOXETINE HCL 30 MG PO CPEP: 90.0000 mg | ORAL_CAPSULE | Freq: Every day | ORAL | 3 refills | Status: DC

## 2019-05-19 MED ORDER — GLYBURIDE 1.25 MG PO TABS: 0.6250 mg | ORAL_TABLET | Freq: Every day | ORAL | 3 refills | Status: DC

## 2019-05-19 NOTE — Progress Notes (Signed)
HPI: Teresa Medina is a 73 y.o. female who  has a past medical history of Arthritis, Chronic kidney disease (CKD) (11/29/2015), Chronic pain syndrome (06/23/2015), Degenerative disc disease, lumbar (06/23/2015), Depression, Diabetes mellitus without complication ([place]), Essential hypertension (06/02/2015), GERD (gastroesophageal reflux disease) (06/02/2015), Herniation of intervertebral disc between L4 and L5 (06/23/2015), History of vitamin D deficiency (06/02/2015), Hypertension, Levoscoliosis (06/23/2015), Migraine (06/02/2015), Osteoporosis (06/02/2015), Retrolisthesis of vertebrae (06/23/2015), Seasonal allergies (06/02/2015), Type 2 diabetes mellitus ([place]) (06/02/2015), and Type 2 diabetes with nephropathy ([place]) (11/29/2015).  she presents to [facility] today, 05/19/19,  for chief complaint of:  Pain medication refills  A1C follow-up   Chronic pain: Patient is on fentanyl patches chronically for lower back pain, doing well on these. PDMP reviewed today 05/19/19 and no concerns. Last filled #10 patches on 05/08/2019. Pt has controlled substance agreement on file updated 10/15/2018.    Diabetes: A1c was elevated 10/2018 up to 7.8, we decided to restart the glyburide but patient has again noted some hypoglycemic episodes with this so we stopped it 01/2019 when A1C was 5.5. Today 05/19/19 A1C       At today's visit 05/19/19 ... PMH, PSH, FH reviewed and updated as needed.  Current medication list and allergy/intolerance hx reviewed and updated as needed. (See remainder of HPI, ROS, Phys Exam below)   No results found.  Results for orders placed or performed in visit on 05/19/19 (from the past 72 hour(s))  POCT HgB A1C     Status: Abnormal   Collection Time: 05/19/19  1:39 PM  Result Value Ref Range   Hemoglobin A1C 5.8 (A) 4.0 - 5.6 %   HbA1c POC (<> result, manual entry)     HbA1c, POC (prediabetic range)     HbA1c, POC (controlled diabetic range)             ASSESSMENT/PLAN: The primary encounter diagnosis was Type 2 diabetes mellitus with hypoglycemia without coma, without long-term current use of insulin ([place]). Diagnoses of Need for influenza vaccination, Chronic pain syndrome, Degenerative disc disease, lumbar, Herniation of intervertebral disc between L4 and L5, History of vitamin D deficiency, and Vitamin D deficiency were also pertinent to this visit.   Orders Placed This Encounter  Procedures  . Flu Vaccine QUAD High Dose(Fluad)  . CBC  . COMPLETE METABOLIC PANEL WITH GFR  . Lipid panel  . TSH  . VITAMIN D 25 Hydroxy (Vit-D Deficiency, Fractures)  . POCT HgB A1C     Meds ordered this encounter  Medications  . aspirin EC 81 MG tablet    Sig: Take 1 tablet (81 mg total) by mouth daily.    Dispense:  90 tablet    Refill:  3  . DULoxetine (CYMBALTA) 30 MG capsule    Sig: Take 3 capsules (90 mg total) by mouth daily.    Dispense:  270 capsule    Refill:  3  . glyBURIDE (DIABETA) 1.25 MG tablet    Sig: Take 0.5 tablets (0.625 mg total) by mouth daily with breakfast.    Dispense:  45 tablet    Refill:  3  . DISCONTD: metoprolol succinate (TOPROL-XL) 50 MG 24 hr tablet    Sig: TAKE ONE TABLET BY MOUTH EVERY DAY. TAKE WITH OR IMMEDIATELY FOLLOWING A MEAL    Dispense:  90 tablet    Refill:  3  . montelukast (SINGULAIR) 10 MG tablet    Sig: Take 1 tablet (10 mg total) by mouth at bedtime.  Dispense:  90 tablet    Refill:  3  . RABEprazole (ACIPHEX) 20 MG tablet    Sig: Take 1 tablet (20 mg total) by mouth daily.    Dispense:  90 tablet    Refill:  3    If PA needed: has failed omeprazole, pantoprazole, famotidine, ranitidine  . topiramate (TOPAMAX) 50 MG tablet    Sig: Take 3 tablets (150 mg total) by mouth at bedtime.    Dispense:  270 tablet    Refill:  3  . fentaNYL (DURAGESIC) 50 MCG/HR    Sig: Place 1 patch onto the skin every 3 (three) days.    Dispense:  10 patch    Refill:  0  . SUMAtriptan  (IMITREX) 100 MG tablet    Sig: TAKE 1/2 TO 1 TABLET BY MOUTH EVERY 2 HOURS AS NEEDED    Dispense:  30 tablet    Refill:  0  . metoprolol succinate (TOPROL-XL) 50 MG 24 hr tablet    Sig: TAKE ONE TABLET BY MOUTH EVERY DAY. TAKE WITH OR IMMEDIATELY FOLLOWING A MEAL    Dispense:  30 tablet    Refill:  0    Note: 30 days to local pharmacy, 90 days w/ 3 refills sent to [facility] 05/19/2019    There are no Patient Instructions on file for this visit.    Follow-up plan: Return for 3 months virtual/phone for pain Rx refills. 6 months annual Medicare w/ Dr Lyn Hollingshead .                                                 ################################################# ################################################# ################################################# #################################################    No outpatient medications have been marked as taking for the 05/19/19 encounter (Appointment) with Sunnie Nielsen, DO.    No Known Allergies     Review of Systems:  Constitutional: No recent illness  Cardiac: No  chest pain, No  pressure, No palpitations  Respiratory:  No  shortness of breath. No  Cough  Musculoskeletal: No new myalgia/arthralgia  Neurologic: No  weakness, No  Dizziness  Psychiatric: No  concerns with depression, No  concerns with anxiety  Exam:  BP 133/72 (BP Location: Left Arm, Patient Position: Sitting, Cuff Size: Normal)   Pulse 79   Temp 97.7 F (36.5 C) (Oral)   Wt 193 lb 14.4 oz (88 kg)   BMI 36.64 kg/m   Constitutional: VS see above. General Appearance: alert, well-developed, well-nourished, NAD  Neck: No masses, trachea midline.   Respiratory: Normal respiratory effort.   Musculoskeletal: Gait normal, significant thoracic kyphosis, ambulates with cane.    Neurological: Normal balance/coordination. No tremor.  Skin: warm, dry, intact.   Psychiatric: Normal judgment/insight.  Normal mood and affect. Oriented x3.       Visit summary with medication list and pertinent instructions was printed for patient to review, patient was advised to alert [place] if any updates are needed. All questions at time of visit were answered - patient instructed to contact office with any additional concerns. ER/RTC precautions were reviewed with the patient and understanding verbalized.   Note: Total time spent 25 minutes, greater than 50% of the visit was spent face-to-face counseling and coordinating care for the following: The primary encounter diagnosis was Type 2 diabetes mellitus with hypoglycemia without coma, without long-term current use of insulin (HCC). Diagnoses of Need for influenza vaccination,  Chronic pain syndrome, Degenerative disc disease, lumbar, Herniation of intervertebral disc between L4 and L5, History of vitamin D deficiency, and Vitamin D deficiency were also pertinent to this visit.Marland Kitchen.  Please note: voice recognition software was used to produce this document, and typos may escape review. Please contact Dr. Lyn HollingsheadAlexander for any needed clarifications.    Follow up plan: Return for 3 months virtual/phone for pain Rx refills. 6 months annual Medicare w/ Dr Lyn HollingsheadAlexander .

## 2019-06-03 ENCOUNTER — Telehealth: Payer: Self-pay | Admitting: Osteopathic Medicine

## 2019-06-03 NOTE — Telephone Encounter (Signed)
Is wondering why Asprin EC 81mg  is now being sent in, she stated she gets it herself. Also Metoprolol needs to be ordered asap and sent through mail apparently?

## 2019-06-03 NOTE — Telephone Encounter (Signed)
Task completed. Provider sent in the refills for aspirin and metoprolol. Pt was due to run out of her blood pressure medication so Provider sent in an emergency #30 to local pharmacy. Provider also sent a separate rx for 1 year worth (#90 w/3 rfs) to her mail order pharmacy during last visit. Pt will need to contact mail order regarding delivery. Thanks.

## 2019-06-12 ENCOUNTER — Other Ambulatory Visit: Payer: Self-pay | Admitting: Osteopathic Medicine

## 2019-06-12 ENCOUNTER — Other Ambulatory Visit: Payer: Self-pay | Admitting: Physician Assistant

## 2019-06-17 DIAGNOSIS — H0100A Unspecified blepharitis right eye, upper and lower eyelids: Secondary | ICD-10-CM | POA: Diagnosis not present

## 2019-06-17 DIAGNOSIS — H02831 Dermatochalasis of right upper eyelid: Secondary | ICD-10-CM | POA: Diagnosis not present

## 2019-06-17 DIAGNOSIS — H52203 Unspecified astigmatism, bilateral: Secondary | ICD-10-CM | POA: Diagnosis not present

## 2019-06-17 DIAGNOSIS — H0100B Unspecified blepharitis left eye, upper and lower eyelids: Secondary | ICD-10-CM | POA: Diagnosis not present

## 2019-06-17 DIAGNOSIS — H25813 Combined forms of age-related cataract, bilateral: Secondary | ICD-10-CM | POA: Diagnosis not present

## 2019-06-17 DIAGNOSIS — H02834 Dermatochalasis of left upper eyelid: Secondary | ICD-10-CM | POA: Diagnosis not present

## 2019-06-17 DIAGNOSIS — E119 Type 2 diabetes mellitus without complications: Secondary | ICD-10-CM | POA: Diagnosis not present

## 2019-06-17 DIAGNOSIS — H43813 Vitreous degeneration, bilateral: Secondary | ICD-10-CM | POA: Diagnosis not present

## 2019-06-17 DIAGNOSIS — H04123 Dry eye syndrome of bilateral lacrimal glands: Secondary | ICD-10-CM | POA: Diagnosis not present

## 2019-06-17 DIAGNOSIS — H527 Unspecified disorder of refraction: Secondary | ICD-10-CM | POA: Diagnosis not present

## 2019-07-13 ENCOUNTER — Other Ambulatory Visit: Payer: Self-pay | Admitting: Osteopathic Medicine

## 2019-07-13 ENCOUNTER — Encounter: Payer: Self-pay | Admitting: Osteopathic Medicine

## 2019-07-13 DIAGNOSIS — M5126 Other intervertebral disc displacement, lumbar region: Secondary | ICD-10-CM

## 2019-07-13 DIAGNOSIS — M5136 Other intervertebral disc degeneration, lumbar region: Secondary | ICD-10-CM

## 2019-07-13 MED ORDER — METOPROLOL SUCCINATE ER 50 MG PO TB24: ORAL_TABLET | ORAL | 1 refills | Status: DC

## 2019-07-20 DIAGNOSIS — Z01818 Encounter for other preprocedural examination: Secondary | ICD-10-CM | POA: Diagnosis not present

## 2019-07-23 DIAGNOSIS — H25811 Combined forms of age-related cataract, right eye: Secondary | ICD-10-CM | POA: Diagnosis not present

## 2019-07-24 LAB — HM DIABETES EYE EXAM

## 2019-07-26 ENCOUNTER — Encounter: Payer: Self-pay | Admitting: Osteopathic Medicine

## 2019-07-27 NOTE — Telephone Encounter (Signed)
The Prior Authorization request has been approved for RABEprazole Sodium 20MG  OR TBEC. The authorization is valid from 06/27/2019 through 07/26/2020. A letter of explanation will also be mailed to the patient..  Pharmacy is aware.

## 2019-08-12 ENCOUNTER — Other Ambulatory Visit: Payer: Self-pay | Admitting: Osteopathic Medicine

## 2019-08-12 DIAGNOSIS — M5136 Other intervertebral disc degeneration, lumbar region: Secondary | ICD-10-CM

## 2019-08-12 DIAGNOSIS — M5126 Other intervertebral disc displacement, lumbar region: Secondary | ICD-10-CM

## 2019-08-12 NOTE — Telephone Encounter (Signed)
[facility] pharmacy requesting med refills for fentaNYL.

## 2019-08-19 ENCOUNTER — Ambulatory Visit: Payer: Medicare Other | Admitting: Osteopathic Medicine

## 2019-08-20 DIAGNOSIS — H52202 Unspecified astigmatism, left eye: Secondary | ICD-10-CM | POA: Diagnosis not present

## 2019-08-20 DIAGNOSIS — Z961 Presence of intraocular lens: Secondary | ICD-10-CM | POA: Diagnosis not present

## 2019-08-20 DIAGNOSIS — H25812 Combined forms of age-related cataract, left eye: Secondary | ICD-10-CM | POA: Diagnosis not present

## 2019-08-21 ENCOUNTER — Encounter: Payer: Self-pay | Admitting: Osteopathic Medicine

## 2019-08-26 ENCOUNTER — Ambulatory Visit: Payer: Medicare Other | Admitting: Osteopathic Medicine

## 2019-09-14 ENCOUNTER — Other Ambulatory Visit: Payer: Self-pay | Admitting: Osteopathic Medicine

## 2019-09-14 DIAGNOSIS — M5126 Other intervertebral disc displacement, lumbar region: Secondary | ICD-10-CM

## 2019-09-14 DIAGNOSIS — M5136 Other intervertebral disc degeneration, lumbar region: Secondary | ICD-10-CM

## 2019-09-14 NOTE — Telephone Encounter (Signed)
Requested medication (s) are due for refill today: yes  Requested medication (s) are on the active medication list: yes  Last refill:  Fentanyl: 08/12/19  Flexaril: 06/12/19  Future visit scheduled: yes  Notes to clinic:  both medications not delegated to NT to refill   Requested Prescriptions  Pending Prescriptions Disp Refills   fentaNYL ([place]) 50 MCG/HR [Pharmacy Med Name: fentanyl 50 mcg/hr transdermal patch] 10 patch 0    Sig: PLACE ONE PATCH ONTO THE SKIN EVERY 3 DAYS      Not Delegated - Analgesics:  Opioid Agonists Failed - 09/14/2019 10:32 AM      Failed - This refill cannot be delegated      Failed - Urine Drug Screen completed in last 360 days.      Passed - Valid encounter within last 6 months    Recent Outpatient Visits           3 months ago Type 2 diabetes mellitus with hypoglycemia without coma, without long-term current use of insulin ([place])   [place] [facility], [place], DO   8 months ago Type 2 diabetes mellitus with hypoglycemia without coma, without long-term current use of insulin [facility])   [place] [facility], Lanelle Bal, DO   11 months ago Degenerative disc disease, lumbar   [place] [facility], Lanelle Bal, DO   1 year ago Hypotension due to drugs   [facility], Royetta Car, PA-C   1 year ago Hypotension due to drugs   [place], Lanelle Bal, DO       Future Appointments             In 1 week Emeterio Reeve, DO [place]   In 2 months Emeterio Reeve, DO [place] [facility]              cyclobenzaprine (FLEXERIL) 10 MG tablet [Pharmacy Med Name: cyclobenzaprine 10 mg tablet] 90 tablet 0    Sig: TAKE ONE TABLET BY MOUTH THREE TIMES DAILY      Not Delegated - Analgesics:  Muscle  Relaxants Failed - 09/14/2019 10:32 AM      Failed - This refill cannot be delegated      Passed - Valid encounter within last 6 months    Recent Outpatient Visits           3 months ago Type 2 diabetes mellitus with hypoglycemia without coma, without long-term current use of insulin ([place])   [place] [facility], Lanelle Bal, DO   8 months ago Type 2 diabetes mellitus with hypoglycemia without coma, without long-term current use of insulin [facility])   [place] [facility], Lanelle Bal, DO   11 months ago Degenerative disc disease, lumbar   [place] [facility], Lanelle Bal, DO   1 year ago Hypotension due to drugs   [facility], Royetta Car, PA-C   1 year ago Hypotension due to drugs   [place], Lanelle Bal, DO       Future Appointments             In 1 week Emeterio Reeve, [place]   In 2 months Emeterio Reeve, DO [facility]

## 2019-09-21 DIAGNOSIS — Z01818 Encounter for other preprocedural examination: Secondary | ICD-10-CM | POA: Diagnosis not present

## 2019-09-22 ENCOUNTER — Encounter: Payer: Self-pay | Admitting: Osteopathic Medicine

## 2019-09-22 ENCOUNTER — Telehealth (INDEPENDENT_AMBULATORY_CARE_PROVIDER_SITE_OTHER): Payer: Medicare Other | Admitting: Osteopathic Medicine

## 2019-09-22 VITALS — Wt 190.4 lb

## 2019-09-22 DIAGNOSIS — G894 Chronic pain syndrome: Secondary | ICD-10-CM

## 2019-09-22 DIAGNOSIS — E11649 Type 2 diabetes mellitus with hypoglycemia without coma: Secondary | ICD-10-CM | POA: Diagnosis not present

## 2019-09-22 DIAGNOSIS — M5126 Other intervertebral disc displacement, lumbar region: Secondary | ICD-10-CM

## 2019-09-22 DIAGNOSIS — M4310 Spondylolisthesis, site unspecified: Secondary | ICD-10-CM | POA: Diagnosis not present

## 2019-09-22 DIAGNOSIS — M5136 Other intervertebral disc degeneration, lumbar region: Secondary | ICD-10-CM | POA: Diagnosis not present

## 2019-09-22 MED ORDER — FENTANYL 50 MCG/HR TD PT72: 1.0000 | MEDICATED_PATCH | TRANSDERMAL | 0 refills | Status: DC

## 2019-09-22 NOTE — Progress Notes (Signed)
Virtual Visit via Phone   I connected with      Teresa Medina on 09/22/19 at 8:22 AM  by a telemedicine application and verified that I am speaking with the correct person using two identifiers.  Patient is at home I am in office   I discussed the limitations of evaluation and management by telemedicine and the availability of in person appointments. The patient expressed understanding and agreed to proceed.  History of Present Illness: Teresa Medina is a 74 y.o. female who would like to discuss refill pain meds, needs labs   Doing well overall, no questions or concerns today.  PDMP reviewed, last filled Fentanyl 09/15/19. Last A1C 5.8 in 05/2019.   Observations/Objective: Wt 190 lb 6.4 oz (86.4 kg)   BMI 35.98 kg/m  BP Readings from Last 3 Encounters:  05/19/19 133/72  01/15/19 95/63  10/15/18 98/65   Exam: Normal Speech.    Lab and Radiology Results No results found for this or any previous visit (from the past 72 hour(s)). No results found.     Assessment and Plan: 74 y.o. female with The primary encounter diagnosis was Chronic pain syndrome. Diagnoses of Degenerative disc disease, lumbar, Retrolisthesis of vertebrae, Type 2 diabetes mellitus with hypoglycemia without coma, without long-term current use of insulin (HCC), and Herniation of intervertebral disc between L4 and L5 were also pertinent to this visit.  Doesn't want COVID vaccine.  Chronic conditions stable.    PDMP reviewed during this encounter. No orders of the defined types were placed in this encounter.  Meds ordered this encounter  Medications  . DISCONTD: fentaNYL ([place]) 50 MCG/HR    Sig: Place 1 patch onto the skin every 3 (three) days.    Dispense:  10 patch    Refill:  0  . fentaNYL (DURAGESIC) 50 MCG/HR    Sig: Place 1 patch onto the skin every 3 (three) days.    Dispense:  10 patch    Refill:  0   There are no Patient Instructions on file for this visit.  Instructions  sent via MyChart. If MyChart not available, pt was given option for info via personal e-mail w/ no guarantee of protected health info over unsecured e-mail communication, and MyChart sign-up instructions were sent to patient.   Follow Up Instructions: Return in about 3 months (around 12/20/2019) for LAB VISIT ONLY 10/2019, .    I discussed the assessment and treatment plan with the patient. The patient was provided an opportunity to ask questions and all were answered. The patient agreed with the plan and demonstrated an understanding of the instructions.   The patient was advised to call back or seek an in-person evaluation if any new concerns, if symptoms worsen or if the condition fails to improve as anticipated.  20 minutes of non-face-to-face time was provided during this encounter.      . . . . . . . . . . . . . Marland Kitchen                   Historical information moved to improve visibility of documentation.  Past Medical History:  Diagnosis Date  . Arthritis   . Chronic kidney disease (CKD) 11/29/2015   next visit: get UA, Phos, Vit D, PTH, CMP   . Chronic pain syndrome 06/23/2015   Records reviewed: pt has been on Fentanyl TD, Cymbalta, Flexeril  . Degenerative disc disease, lumbar 06/23/2015   MRI 06/2014 most severe L4-5  . Depression   .  Diabetes mellitus without complication (HCC)   . Essential hypertension 06/02/2015  . GERD (gastroesophageal reflux disease) 06/02/2015  . Herniation of intervertebral disc between L4 and L5 06/23/2015   MRI 06/2014 moderate to severe R foraminal narrowing  . History of vitamin D deficiency 06/02/2015  . Hypertension   . Levoscoliosis 06/23/2015   Mild, MRI 06/22/14  . Migraine 06/02/2015  . Osteoporosis 06/02/2015  . Retrolisthesis of vertebrae 06/23/2015   Gr1 L1 on L2 MRI 06/2014  . Seasonal allergies 06/02/2015  . Type 2 diabetes mellitus (HCC) 06/02/2015  . Type 2 diabetes with nephropathy (HCC) 11/29/2015    DIABETES SCREENING/PREVENTIVE CARE: Updated 03/05/16  A1C past 3-6 mos: Yes, 03/05/16 6.6% controlled? Yes BP goal <140/90: Yes  LDL goal <70: no - 100 11/24/15 Eye exam annually: Yes , importance discussed with patient Foot exam:  08/31/15 Microalbuminuria:   not applicable, patient is on ACE/ARB Metformin: No - declines ACE/ARB: Yes  Antiplatelet if ASCVD Risk >10%: Recommended Statin: No    Past Surgical History:  Procedure Laterality Date  . BUNIONECTOMY Left 1993  . FOOT SURGERY  2008  . FOOT SURGERY  2003  . NECK SURGERY  1993  . REPLACEMENT TOTAL KNEE Bilateral 1999   R in Jan, L in Apr  . TONSILLECTOMY  1952   Social History   Tobacco Use  . Smoking status: Never Smoker  . Smokeless tobacco: Never Used  Substance Use Topics  . Alcohol use: No    Alcohol/week: 0.0 standard drinks   family history includes Breast cancer in her maternal aunt; Cancer in her sister; Coronary artery disease in her mother; Depression in her brother; Diabetes in her sister; Heart Problems in her brother and sister; Migraines in her father and mother; Pneumonia in her father; Pulmonary fibrosis in her father.  Medications: Current Outpatient Medications  Medication Sig Dispense Refill  . aspirin EC 81 MG tablet Take 1 tablet (81 mg total) by mouth daily. 90 tablet 3  . Biotin 16606 MCG TABS Take 10,000 mcg by mouth daily.    . Cholecalciferol (VITAMIN D-3) 1000 UNITS CAPS Take 2,000 Units by mouth daily.     . cyclobenzaprine (FLEXERIL) 10 MG tablet TAKE ONE TABLET BY MOUTH THREE TIMES DAILY 90 tablet 0  . DULoxetine (CYMBALTA) 30 MG capsule Take 3 capsules (90 mg total) by mouth daily. 270 capsule 3  . [START ON 11/14/2019] fentaNYL (DURAGESIC) 50 MCG/HR Place 1 patch onto the skin every 3 (three) days. 10 patch 0  . fluticasone (FLONASE) 50 MCG/ACT nasal spray Place 1 spray into both nostrils daily. 48 g 3  . glucose blood test strip by Other route. Free Style Test Strips    . glyBURIDE (DIABETA)  1.25 MG tablet Take 0.5 tablets (0.625 mg total) by mouth daily with breakfast. 45 tablet 3  . Homeopathic Products (SIMILASAN DRY EYE RELIEF OP) Apply 1 drop to eye 2 (two) times daily.    . Lactobacillus (PROBIOTIC ACIDOPHILUS PO) Take by mouth.    . metoprolol succinate (TOPROL-XL) 50 MG 24 hr tablet TAKE ONE TABLET BY MOUTH EVERY DAY WITH OR immediately following a meal 90 tablet 1  . montelukast (SINGULAIR) 10 MG tablet Take 1 tablet (10 mg total) by mouth at bedtime. 90 tablet 3  . RABEprazole (ACIPHEX) 20 MG tablet Take 1 tablet (20 mg total) by mouth daily. 90 tablet 3  . SUMAtriptan (IMITREX) 100 MG tablet TAKE 1/2 TO 1 TABLET BY MOUTH EVERY 2 HOURS AS NEEDED  30 tablet 0  . topiramate (TOPAMAX) 50 MG tablet Take 3 tablets (150 mg total) by mouth at bedtime. 270 tablet 3   No current facility-administered medications for this visit.   No Known Allergies

## 2019-09-24 DIAGNOSIS — K449 Diaphragmatic hernia without obstruction or gangrene: Secondary | ICD-10-CM | POA: Diagnosis not present

## 2019-09-24 DIAGNOSIS — E1136 Type 2 diabetes mellitus with diabetic cataract: Secondary | ICD-10-CM | POA: Diagnosis not present

## 2019-09-24 DIAGNOSIS — H52202 Unspecified astigmatism, left eye: Secondary | ICD-10-CM | POA: Diagnosis not present

## 2019-09-24 DIAGNOSIS — M1990 Unspecified osteoarthritis, unspecified site: Secondary | ICD-10-CM | POA: Diagnosis not present

## 2019-09-24 DIAGNOSIS — F329 Major depressive disorder, single episode, unspecified: Secondary | ICD-10-CM | POA: Diagnosis not present

## 2019-09-24 DIAGNOSIS — I10 Essential (primary) hypertension: Secondary | ICD-10-CM | POA: Diagnosis not present

## 2019-09-24 DIAGNOSIS — H269 Unspecified cataract: Secondary | ICD-10-CM | POA: Diagnosis not present

## 2019-09-24 DIAGNOSIS — G4730 Sleep apnea, unspecified: Secondary | ICD-10-CM | POA: Diagnosis not present

## 2019-09-24 DIAGNOSIS — H25812 Combined forms of age-related cataract, left eye: Secondary | ICD-10-CM | POA: Diagnosis not present

## 2019-09-24 DIAGNOSIS — Z9841 Cataract extraction status, right eye: Secondary | ICD-10-CM | POA: Diagnosis not present

## 2019-09-24 DIAGNOSIS — Z6835 Body mass index (BMI) 35.0-35.9, adult: Secondary | ICD-10-CM | POA: Diagnosis not present

## 2019-09-24 DIAGNOSIS — K219 Gastro-esophageal reflux disease without esophagitis: Secondary | ICD-10-CM | POA: Diagnosis not present

## 2019-09-24 DIAGNOSIS — E669 Obesity, unspecified: Secondary | ICD-10-CM | POA: Diagnosis not present

## 2019-09-25 DIAGNOSIS — H04123 Dry eye syndrome of bilateral lacrimal glands: Secondary | ICD-10-CM | POA: Diagnosis not present

## 2019-09-25 DIAGNOSIS — H0100A Unspecified blepharitis right eye, upper and lower eyelids: Secondary | ICD-10-CM | POA: Diagnosis not present

## 2019-09-25 DIAGNOSIS — H02831 Dermatochalasis of right upper eyelid: Secondary | ICD-10-CM | POA: Diagnosis not present

## 2019-09-25 DIAGNOSIS — H0100B Unspecified blepharitis left eye, upper and lower eyelids: Secondary | ICD-10-CM | POA: Diagnosis not present

## 2019-09-25 DIAGNOSIS — H527 Unspecified disorder of refraction: Secondary | ICD-10-CM | POA: Diagnosis not present

## 2019-09-25 DIAGNOSIS — H43813 Vitreous degeneration, bilateral: Secondary | ICD-10-CM | POA: Diagnosis not present

## 2019-09-25 DIAGNOSIS — E119 Type 2 diabetes mellitus without complications: Secondary | ICD-10-CM | POA: Diagnosis not present

## 2019-09-25 DIAGNOSIS — H02834 Dermatochalasis of left upper eyelid: Secondary | ICD-10-CM | POA: Diagnosis not present

## 2019-09-25 DIAGNOSIS — Z961 Presence of intraocular lens: Secondary | ICD-10-CM | POA: Diagnosis not present

## 2019-09-25 LAB — HM DIABETES EYE EXAM

## 2019-10-22 ENCOUNTER — Encounter: Payer: Self-pay | Admitting: Osteopathic Medicine

## 2019-10-22 DIAGNOSIS — Z961 Presence of intraocular lens: Secondary | ICD-10-CM | POA: Diagnosis not present

## 2019-10-22 DIAGNOSIS — E119 Type 2 diabetes mellitus without complications: Secondary | ICD-10-CM | POA: Diagnosis not present

## 2019-10-22 DIAGNOSIS — H26491 Other secondary cataract, right eye: Secondary | ICD-10-CM | POA: Diagnosis not present

## 2019-10-22 LAB — HM DIABETES EYE EXAM

## 2019-11-12 ENCOUNTER — Ambulatory Visit (INDEPENDENT_AMBULATORY_CARE_PROVIDER_SITE_OTHER): Payer: Medicare Other | Admitting: Obstetrics & Gynecology

## 2019-11-12 ENCOUNTER — Other Ambulatory Visit: Payer: Self-pay

## 2019-11-12 ENCOUNTER — Encounter: Payer: Self-pay | Admitting: Obstetrics & Gynecology

## 2019-11-12 VITALS — BP 132/74 | HR 90 | Ht 63.0 in | Wt 195.0 lb

## 2019-11-12 DIAGNOSIS — N9089 Other specified noninflammatory disorders of vulva and perineum: Secondary | ICD-10-CM

## 2019-11-12 NOTE — Progress Notes (Signed)
History:  73 y.o. [id number] here today for eval of a vulvar bump. Pt reports that the bump was noted for 2 months .She notices it mainly with wiping. She is worried because her brother and many others in her family have died due to cancer and she wants to be careful.   The following portions of the patient's history were reviewed and updated as appropriate: allergies, current medications, past family history, past medical history, past social history, past surgical history and problem list.  Review of Systems:  Pertinent items are noted in HPI.    Objective:  Physical Exam Blood pressure 132/74, pulse 90, height 5\' 3"  (1.6 m), weight 195 lb (88.5 kg).  CONSTITUTIONAL: Well-developed, well-nourished female in no acute distress.  HENT:  Normocephalic, atraumatic EYES: Conjunctivae and EOM are normal. No scleral icterus.  NECK: Normal range of motion SKIN: Skin is warm and dry. No rash noted. Not diaphoretic.No pallor. NEUROLGIC: Alert and oriented to person, place, and time. Normal coordination.   Pelvic: Atrophic appearing external genitalia; normal appearing vaginal mucosa. Normal discharge. There are no palpable or visual lesion on the vulva. The pt attempted to find the area but, could not locate it.    Assessment & Plan:  Vulvar bump- resolved.   Pt instructed to f/u if she feels lesion again or if other sx develop.   All of her questions were answered.   Total face-to-face time with patient was 15 min.  Greater than 50% was spent in counseling and coordination of care with the patient.   Teresa Medina, M.D., 

## 2019-11-18 ENCOUNTER — Encounter: Payer: Medicare Other | Admitting: Osteopathic Medicine

## 2019-11-18 ENCOUNTER — Ambulatory Visit (INDEPENDENT_AMBULATORY_CARE_PROVIDER_SITE_OTHER): Payer: Medicare Other | Admitting: Osteopathic Medicine

## 2019-11-18 ENCOUNTER — Encounter: Payer: Self-pay | Admitting: Osteopathic Medicine

## 2019-11-18 ENCOUNTER — Other Ambulatory Visit: Payer: Self-pay

## 2019-11-18 VITALS — BP 117/79 | HR 80 | Temp 97.6°F | Wt 195.1 lb

## 2019-11-18 DIAGNOSIS — E11649 Type 2 diabetes mellitus with hypoglycemia without coma: Secondary | ICD-10-CM

## 2019-11-18 DIAGNOSIS — N183 Chronic kidney disease, stage 3 unspecified: Secondary | ICD-10-CM | POA: Diagnosis not present

## 2019-11-18 DIAGNOSIS — I10 Essential (primary) hypertension: Secondary | ICD-10-CM | POA: Diagnosis not present

## 2019-11-18 DIAGNOSIS — Z780 Asymptomatic menopausal state: Secondary | ICD-10-CM | POA: Diagnosis not present

## 2019-11-18 DIAGNOSIS — M5126 Other intervertebral disc displacement, lumbar region: Secondary | ICD-10-CM | POA: Diagnosis not present

## 2019-11-18 DIAGNOSIS — M810 Age-related osteoporosis without current pathological fracture: Secondary | ICD-10-CM

## 2019-11-18 DIAGNOSIS — G894 Chronic pain syndrome: Secondary | ICD-10-CM | POA: Diagnosis not present

## 2019-11-18 DIAGNOSIS — E559 Vitamin D deficiency, unspecified: Secondary | ICD-10-CM | POA: Diagnosis not present

## 2019-11-18 DIAGNOSIS — Z1231 Encounter for screening mammogram for malignant neoplasm of breast: Secondary | ICD-10-CM

## 2019-11-18 DIAGNOSIS — M5136 Other intervertebral disc degeneration, lumbar region: Secondary | ICD-10-CM

## 2019-11-18 DIAGNOSIS — Z0000 Encounter for general adult medical examination without abnormal findings: Secondary | ICD-10-CM

## 2019-11-18 LAB — POCT GLYCOSYLATED HEMOGLOBIN (HGB A1C): Hemoglobin A1C: 5.9 % — AB (ref 4.0–5.6)

## 2019-11-18 MED ORDER — FENTANYL 50 MCG/HR TD PT72: 1.0000 | MEDICATED_PATCH | TRANSDERMAL | 0 refills | Status: DC

## 2019-11-18 NOTE — Progress Notes (Signed)
HPI: Teresa Medina is a 74 y.o. female  who presents to [place] today, 11/18/19,  for Medicare Annual Wellness Exam  Patient presents for annual physical/Medicare wellness exam. no complaints today.   Past medical, surgical, social and family history reviewed:  Patient Active Problem List   Diagnosis Date Noted  . Closed fracture of distal end of humerus 12/19/2017  . Type 2 diabetes with nephropathy ([place]) 11/29/2015  . Chronic kidney disease (CKD) 11/29/2015  . Hx of bone density study 11/29/2015  . Postmenopausal 08/31/2015  . Chronic pain syndrome 06/23/2015  . Levoscoliosis 06/23/2015  . Retrolisthesis of vertebrae 06/23/2015  . Degenerative disc disease, lumbar 06/23/2015  . Herniation of intervertebral disc between L4 and L5 06/23/2015  . Type 2 diabetes mellitus ([place]) 06/02/2015  . Essential hypertension 06/02/2015  . Migraine 06/02/2015  . Seasonal allergies 06/02/2015  . History of vitamin D deficiency 06/02/2015  . Osteoporosis 06/02/2015  . GERD (gastroesophageal reflux disease) 06/02/2015    Past Surgical History:  Procedure Laterality Date  . BUNIONECTOMY Left 1993  . eye surgery    . FOOT SURGERY  2008  . FOOT SURGERY  2003  . NECK SURGERY  1993  . REPLACEMENT TOTAL KNEE Bilateral 1999   R in Jan, L in Apr  . TONSILLECTOMY  1952    Social History   Socioeconomic History  . Marital status: Widowed    Spouse name: Not on file  . Number of children: 2  . Years of education: 31  . Highest education level: GED or equivalent  Occupational History  . Occupation: office work    Comment: retired  Tobacco Use  . Smoking status: Never Smoker  . Smokeless tobacco: Never Used  Substance and Sexual Activity  . Alcohol use: No    Alcohol/week: 0.0 standard drinks  . Drug use: No  . Sexual activity: Not Currently  Other Topics Concern  . Not on file  Social History Narrative   Lives at home w/ her son and his  wife   Right-handed   Caffeine: 1 cup of coffee per day   Social Determinants of Health   Financial Resource Strain: Low Risk   . Difficulty of Paying Living Expenses: Not hard at all  Food Insecurity: No Food Insecurity  . Worried About Charity fundraiser in the Last Year: Never true  . Ran Out of Food in the Last Year: Never true  Transportation Needs: No Transportation Needs  . Lack of Transportation (Medical): No  . Lack of Transportation (Non-Medical): No  Physical Activity: Inactive  . Days of Exercise per Week: 0 days  . Minutes of Exercise per Session: 0 min  Stress: No Stress Concern Present  . Feeling of Stress : Not at all  Social Connections: Somewhat Isolated  . Frequency of Communication with Friends and Family: Once a week  . Frequency of Social Gatherings with Friends and Family: More than three times a week  . Attends Religious Services: 1 to 4 times per year  . Active Member of Clubs or Organizations: No  . Attends Archivist Meetings: Never  . Marital Status: Widowed  Intimate Partner Violence: Not At Risk  . Fear of Current or Ex-Partner: No  . Emotionally Abused: No  . Physically Abused: No  . Sexually Abused: No    Family History  Problem Relation Age of Onset  . Diabetes Sister   . Heart Problems Sister  multiple bypass surgeries  . Cancer Sister   . Depression Brother   . Heart Problems Brother        bad heart use oxygen  . Coronary artery disease Mother   . Migraines Mother   . Pulmonary fibrosis Father   . Pneumonia Father   . Migraines Father   . Breast cancer Maternal Aunt      Current medication list and allergy/intolerance information reviewed:    Outpatient Encounter Medications as of 11/18/2019  Medication Sig  . aspirin EC 81 MG tablet Take 1 tablet (81 mg total) by mouth daily.  . Biotin 89211 MCG TABS Take 10,000 mcg by mouth daily.  . Cholecalciferol (VITAMIN D-3) 1000 UNITS CAPS Take 2,000 Units by mouth  daily.   . cyclobenzaprine (FLEXERIL) 10 MG tablet TAKE ONE TABLET BY MOUTH THREE TIMES DAILY  . DULoxetine (CYMBALTA) 30 MG capsule Take 3 capsules (90 mg total) by mouth daily.  . fentaNYL (DURAGESIC) 50 MCG/HR Place 1 patch onto the skin every 3 (three) days.  . fluticasone (FLONASE) 50 MCG/ACT nasal spray Place 1 spray into both nostrils daily.  Marland Kitchen glucose blood test strip by Other route. Free Style Test Strips  . glyBURIDE (DIABETA) 1.25 MG tablet Take 0.5 tablets (0.625 mg total) by mouth daily with breakfast.  . Homeopathic Products (SIMILASAN DRY EYE RELIEF OP) Apply 1 drop to eye 2 (two) times daily.  . Lactobacillus (PROBIOTIC ACIDOPHILUS PO) Take by mouth.  . metoprolol succinate (TOPROL-XL) 50 MG 24 hr tablet TAKE ONE TABLET BY MOUTH EVERY DAY WITH OR immediately following a meal  . montelukast (SINGULAIR) 10 MG tablet Take 1 tablet (10 mg total) by mouth at bedtime.  . RABEprazole (ACIPHEX) 20 MG tablet Take 1 tablet (20 mg total) by mouth daily.  . SUMAtriptan (IMITREX) 100 MG tablet TAKE 1/2 TO 1 TABLET BY MOUTH EVERY 2 HOURS AS NEEDED  . topiramate (TOPAMAX) 50 MG tablet Take 3 tablets (150 mg total) by mouth at bedtime.   No facility-administered encounter medications on file as of 11/18/2019.    No Known Allergies     Review of Systems: Review of Systems - negative   Medicare Wellness Questionnaire  Are there smokers in your home (other than you)? no  Depression Screen (Note: if answer to either of the following is "Yes", a more complete depression screening is indicated)   Q1: Over the past two weeks, have you felt down, depressed or hopeless? no  Q2: Over the past two weeks, have you felt little interest or pleasure in doing things? no  Have you lost interest or pleasure in daily life? no  Do you often feel hopeless? no  Do you cry easily over simple problems? no  Activities of Daily Living In your present state of health, do you have any difficulty performing  the following activities?:  Driving? no Managing money?  no Feeding yourself? no Getting from bed to chair? no Climbing a flight of stairs? no Preparing food and eating?: no Bathing or showering? no Getting dressed: no Getting to the toilet? no Using the toilet: no Moving around from place to place: no In the past year have you fallen or had a near fall?: yes  Hearing Difficulties:  Do you often ask people to speak up or repeat themselves? yes Do you experience ringing or noises in your ears? no  Do you have difficulty understanding soft or whispered voices? yes  Memory Difficulties:  Do you feel that you have  a problem with memory? yes  Do you often misplace items? yes  Do you feel safe at home?  yes  Sexual Health:   Are you sexually active?  No  Do you have more than one partner?  No  Advanced Directives:   Advanced directives discussed: has NO advanced directive - not interested in additional information  Additional information provided: no  Risk Factors  Current exercise habits: minimal  Dietary issues discussed:no concerns  Cardiac risk factors: hypertension, positive family history   Exam:  BP 117/79 (BP Location: Left Arm, Patient Position: Sitting, Cuff Size: Normal)   Pulse 80   Temp 97.6 F (36.4 C) (Oral)   Wt 195 lb 1.9 oz (88.5 kg)   BMI 34.56 kg/m  Vision by Snellen chart: right eye:see nurse notes, left eye:see nurse notes  Constitutional: VS see above. General Appearance: alert, well-developed, well-nourished, NAD  Ears, Nose, Mouth, Throat: MMM  Neck: No masses, trachea midline.   Respiratory: Normal respiratory effort. no wheeze, no rhonchi, no rales  Cardiovascular:No lower extremity edema.   Musculoskeletal: Gait normal. No clubbing/cyanosis of digits.   Neurological: Normal balance/coordination. No tremor. Recalls 3 objects and able to read face of watch with correct time.   Skin: warm, dry, intact. No rash/ulcer.   Psychiatric:  Normal judgment/insight. Normal mood and affect. Oriented x3.     ASSESSMENT/PLAN:   Encounter for Medicare annual wellness exam  1. Type 2 diabetes mellitus with hypoglycemia without coma, without long-term current use of insulin (HCC) A1C looks good today  2. Chronic pain syndrome 3. Degenerative disc disease, lumbar 4. Herniation of intervertebral disc between L4 and L5 Refilled Rx  5. Stage 3 chronic kidney disease, unspecified whether stage 3a or 3b CKD labs  6. Postmenopausal No cocnerns  7. Vitamin D deficiency labs  8. Essential hypertension BP Readings from Last 3 Encounters:  11/18/19 117/79  11/12/19 132/74  05/19/19 133/72   9. Encounter for screening mammogram for malignant neoplasm of breast declined  10. Osteoporosis without current pathological fracture, unspecified osteoporosis type decliend Dexa  11. Medicare annual wellness visit, subsequent done   CANCER SCREENING  Lung - USPSTF: 55-80yo w/ 30 py hx unless quit w/in 39yr - does not need  Colon - need records!   Prostate - does not need  Breast - does not need  Cervical - does not need OTHER DISEASE SCREENING  Lipid - needs  DM2 - needs monitoring A1c  AAA - female 29-75yo ever smoked - does not need  Osteoporosis - women 75yo+, men 75yo+ - needs INFECTIOUS DISEASE SCREENING  HIV - does not need  GC/CT - does not need  HepC -does not need  TB - does not need ADULT VACCINATION  Influenza - annual vaccine recommended  Td - booster every 10 years - does not need  Zoster - option at 102, yes at 98+ - needs  PCV13 - does not need  PPSV23 - does not need Immunization History  Administered Date(s) Administered  . Fluad Quad(high Dose 65+) 05/19/2019  . Influenza, High Dose Seasonal PF 07/16/2018  . Influenza,inj,Quad PF,6+ Mos 09/05/2016  . Influenza-Unspecified 04/28/2015  . PPD Test 12/16/2017  . Pneumococcal Conjugate-13 06/04/2014  . Pneumococcal Polysaccharide-23  09/20/2010  . Tdap 11/29/2015   OTHER  Fall - exercise and Vit D age 53+ - does not need  Advanced Directives -  Discussed as above   During the course of the visit the patient was educated and counseled about appropriate  screening and preventive services as noted above.   Patient Instructions (the written plan) was given to the patient.  Medicare Attestation I have personally reviewed: The patient's medical and social history Their use of alcohol, tobacco or illicit drugs Their current medications and supplements The patient's functional ability including ADLs,fall risks, home safety risks, cognitive, and hearing and visual impairment Diet and physical activities Evidence for depression or mood disorders  The patient's weight, height, BMI, and visual acuity have been recorded in the chart.  I have made referrals, counseling, and provided education to the patient based on review of the above and I have provided the patient with a written personalized care plan for preventive services.     Sunnie Nielsen, DO   11/18/19   Visit summary with medication list and pertinent instructions was printed for patient to review. All questions at time of visit were answered - patient instructed to contact office with any additional concerns. ER/RTC precautions were reviewed with the patient. Follow-up plan: No follow-ups on file.

## 2019-11-19 LAB — COMPLETE METABOLIC PANEL WITH GFR
AG Ratio: 1.3 (calc) (ref 1.0–2.5)
ALT: 6 U/L (ref 6–29)
AST: 16 U/L (ref 10–35)
Albumin: 3.9 g/dL (ref 3.6–5.1)
Alkaline phosphatase (APISO): 92 U/L (ref 37–153)
BUN/Creatinine Ratio: 26 (calc) — ABNORMAL HIGH (ref 6–22)
BUN: 29 mg/dL — ABNORMAL HIGH (ref 7–25)
CO2: 20 mmol/L (ref 20–32)
Calcium: 9.3 mg/dL (ref 8.6–10.4)
Chloride: 103 mmol/L (ref 98–110)
Creat: 1.1 mg/dL — ABNORMAL HIGH (ref 0.60–0.93)
GFR, Est African American: 57 mL/min/{1.73_m2} — ABNORMAL LOW (ref >=60)
GFR, Est Non African American: 49 mL/min/{1.73_m2} — ABNORMAL LOW (ref >=60)
Globulin: 3 g/dL (calc) (ref 1.9–3.7)
Glucose, Bld: 98 mg/dL (ref 65–99)
Potassium: 4.9 mmol/L (ref 3.5–5.3)
Sodium: 137 mmol/L (ref 135–146)
Total Bilirubin: 0.5 mg/dL (ref 0.2–1.2)
Total Protein: 6.9 g/dL (ref 6.1–8.1)

## 2019-11-19 LAB — LIPID PANEL
Cholesterol: 186 mg/dL (ref <200)
HDL: 53 mg/dL (ref >=50)
LDL Cholesterol (Calc): 112 mg/dL (calc) — ABNORMAL HIGH
Non-HDL Cholesterol (Calc): 133 mg/dL (calc) — ABNORMAL HIGH (ref <130)
Total CHOL/HDL Ratio: 3.5 (calc) (ref <5.0)
Triglycerides: 107 mg/dL (ref <150)

## 2019-11-19 LAB — CBC
HCT: 46.4 % — ABNORMAL HIGH (ref 35.0–45.0)
Hemoglobin: 14.7 g/dL (ref 11.7–15.5)
MCH: 26.6 pg — ABNORMAL LOW (ref 27.0–33.0)
MCHC: 31.7 g/dL — ABNORMAL LOW (ref 32.0–36.0)
MCV: 84.1 fL (ref 80.0–100.0)
MPV: 11.8 fL (ref 7.5–12.5)
Platelets: 223 10*3/uL (ref 140–400)
RBC: 5.52 10*6/uL — ABNORMAL HIGH (ref 3.80–5.10)
RDW: 13.3 % (ref 11.0–15.0)
WBC: 10.1 10*3/uL (ref 3.8–10.8)

## 2019-11-19 LAB — TSH: TSH: 0.88 mIU/L (ref 0.40–4.50)

## 2019-11-19 LAB — VITAMIN D 25 HYDROXY (VIT D DEFICIENCY, FRACTURES): Vit D, 25-Hydroxy: 65 ng/mL (ref 30–100)

## 2019-12-02 ENCOUNTER — Other Ambulatory Visit: Payer: Self-pay | Admitting: Osteopathic Medicine

## 2019-12-02 DIAGNOSIS — Z8639 Personal history of other endocrine, nutritional and metabolic disease: Secondary | ICD-10-CM

## 2019-12-15 ENCOUNTER — Other Ambulatory Visit: Payer: Self-pay | Admitting: Osteopathic Medicine

## 2020-02-01 ENCOUNTER — Other Ambulatory Visit: Payer: Self-pay | Admitting: Osteopathic Medicine

## 2020-02-01 DIAGNOSIS — M5126 Other intervertebral disc displacement, lumbar region: Secondary | ICD-10-CM

## 2020-02-01 DIAGNOSIS — M5136 Other intervertebral disc degeneration, lumbar region: Secondary | ICD-10-CM

## 2020-02-02 NOTE — Telephone Encounter (Signed)
[facility] requesting med refill for fentanyl patches.

## 2020-02-18 ENCOUNTER — Ambulatory Visit: Payer: Medicare Other | Admitting: Osteopathic Medicine

## 2020-02-29 ENCOUNTER — Other Ambulatory Visit: Payer: Self-pay | Admitting: Osteopathic Medicine

## 2020-02-29 DIAGNOSIS — M5136 Other intervertebral disc degeneration, lumbar region: Secondary | ICD-10-CM

## 2020-02-29 DIAGNOSIS — M5126 Other intervertebral disc displacement, lumbar region: Secondary | ICD-10-CM

## 2020-03-01 NOTE — Telephone Encounter (Signed)
Last refill- 02/02/2020 Last Ov- 11/18/2019

## 2020-03-03 ENCOUNTER — Encounter: Payer: Self-pay | Admitting: Osteopathic Medicine

## 2020-03-03 ENCOUNTER — Other Ambulatory Visit: Payer: Self-pay

## 2020-03-03 ENCOUNTER — Ambulatory Visit (INDEPENDENT_AMBULATORY_CARE_PROVIDER_SITE_OTHER): Payer: Medicare Other | Admitting: Osteopathic Medicine

## 2020-03-03 VITALS — BP 138/92 | HR 87 | Temp 98.0°F | Wt 199.0 lb

## 2020-03-03 DIAGNOSIS — M4310 Spondylolisthesis, site unspecified: Secondary | ICD-10-CM

## 2020-03-03 DIAGNOSIS — M5136 Other intervertebral disc degeneration, lumbar region: Secondary | ICD-10-CM

## 2020-03-03 DIAGNOSIS — G894 Chronic pain syndrome: Secondary | ICD-10-CM

## 2020-03-03 NOTE — Progress Notes (Signed)
Teresa Medina is a 74 y.o. female who presents to  [facility] at [facility]  today, 03/03/20, seeking care for the following:  Marland Kitchen Maintain opiate pain control Rx  Indication for chronic opioid: severe osteoarthritis Medication and dose: fentanyl patches 50 mcg/hour td q3 days # patches per month: 10 Last UDS date: OVERDUE, HAS BEEN OUT OF RX (LEFT PATCHES IN [place])  Opioid Treatment Agreement signed (Y/N): updated today 03/03/20 Opioid Treatment Agreement last reviewed with patient:   03/03/20  NCCSRS reviewed this encounter (include red flags):  yes, no concerns     ASSESSMENT & PLAN with other pertinent findings:  The primary encounter diagnosis was Chronic pain syndrome. Diagnoses of Degenerative disc disease, lumbar and Retrolisthesis of vertebrae were also pertinent to this visit.    There are no Patient Instructions on file for this visit.   No orders of the defined types were placed in this encounter.      Follow-up instructions: Return in about 3 months (around 06/03/2020) for VIRTUAL OR IN OFFICE OT MAINTAIN OPIATE RX .                                         BP (!) 138/92 (BP Location: Left Arm, Patient Position: Sitting)   Pulse 87   Temp 98 F (36.7 C)   Wt 199 lb (90.3 kg)   SpO2 98%   BMI 35.25 kg/m   Current Meds  Medication Sig  . aspirin EC 81 MG tablet Take 1 tablet (81 mg total) by mouth daily.  . Biotin 66440 MCG TABS Take 10,000 mcg by mouth daily.  . Cholecalciferol (VITAMIN D-3) 1000 UNITS CAPS Take 2,000 Units by mouth daily.   . cyclobenzaprine (FLEXERIL) 10 MG tablet TAKE ONE TABLET BY MOUTH THREE TIMES DAILY  . DULoxetine (CYMBALTA) 30 MG capsule Take 3 capsules (90 mg total) by mouth daily.  . fentaNYL (DURAGESIC) 50 MCG/HR Place 1 patch onto the skin every 3 (three) days.  . fluticasone (FLONASE) 50 MCG/ACT nasal spray Place 1 spray into both nostrils  daily.  Marland Kitchen glucose blood test strip by Other route. Free Style Test Strips  . glyBURIDE (DIABETA) 1.25 MG tablet Take 0.5 tablets (0.625 mg total) by mouth daily with breakfast.  . Homeopathic Products (SIMILASAN DRY EYE RELIEF OP) Apply 1 drop to eye 2 (two) times daily.  . Lactobacillus (PROBIOTIC ACIDOPHILUS PO) Take by mouth.  . metoprolol succinate (TOPROL-XL) 50 MG 24 hr tablet TAKE ONE TABLET BY MOUTH EVERY DAY WITH OR immediately following a meal  . montelukast (SINGULAIR) 10 MG tablet Take 1 tablet (10 mg total) by mouth at bedtime.  . RABEprazole (ACIPHEX) 20 MG tablet Take 1 tablet (20 mg total) by mouth daily.  . SUMAtriptan (IMITREX) 100 MG tablet TAKE 1/2 TO 1 TABLET BY MOUTH EVERY 2 HOURS AS NEEDED  . topiramate (TOPAMAX) 50 MG tablet Take 3 tablets (150 mg total) by mouth at bedtime.    No results found for this or any previous visit (from the past 72 hour(s)).  No results found.     All questions at time of visit were answered - patient instructed to contact office with any additional concerns or updates.  ER/RTC precautions were reviewed with the patient as applicable.   Please note: voice recognition software was used to produce this document, and typos may escape  review. Please contact Dr. Sheppard Coil for any needed clarifications.   Total encounter time: 30 minutes.

## 2020-03-17 ENCOUNTER — Other Ambulatory Visit: Payer: Self-pay | Admitting: Osteopathic Medicine

## 2020-03-18 NOTE — Telephone Encounter (Signed)
Last refill 12/18/2019

## 2020-03-31 ENCOUNTER — Other Ambulatory Visit: Payer: Self-pay | Admitting: Osteopathic Medicine

## 2020-03-31 DIAGNOSIS — M5136 Other intervertebral disc degeneration, lumbar region: Secondary | ICD-10-CM

## 2020-03-31 DIAGNOSIS — M5126 Other intervertebral disc displacement, lumbar region: Secondary | ICD-10-CM

## 2020-04-24 ENCOUNTER — Other Ambulatory Visit: Payer: Self-pay | Admitting: Osteopathic Medicine

## 2020-04-24 DIAGNOSIS — G894 Chronic pain syndrome: Secondary | ICD-10-CM

## 2020-05-05 ENCOUNTER — Other Ambulatory Visit: Payer: Self-pay | Admitting: Osteopathic Medicine

## 2020-05-05 DIAGNOSIS — M5136 Other intervertebral disc degeneration, lumbar region: Secondary | ICD-10-CM

## 2020-05-05 DIAGNOSIS — M5126 Other intervertebral disc displacement, lumbar region: Secondary | ICD-10-CM

## 2020-06-02 ENCOUNTER — Encounter: Payer: Self-pay | Admitting: Osteopathic Medicine

## 2020-06-02 ENCOUNTER — Ambulatory Visit (INDEPENDENT_AMBULATORY_CARE_PROVIDER_SITE_OTHER): Payer: Medicare Other | Admitting: Osteopathic Medicine

## 2020-06-02 ENCOUNTER — Other Ambulatory Visit: Payer: Self-pay

## 2020-06-02 VITALS — BP 137/87 | HR 92 | Temp 97.5°F | Wt 197.0 lb

## 2020-06-02 DIAGNOSIS — M4310 Spondylolisthesis, site unspecified: Secondary | ICD-10-CM | POA: Diagnosis not present

## 2020-06-02 DIAGNOSIS — M5136 Other intervertebral disc degeneration, lumbar region: Secondary | ICD-10-CM

## 2020-06-02 DIAGNOSIS — F1190 Opioid use, unspecified, uncomplicated: Secondary | ICD-10-CM | POA: Diagnosis not present

## 2020-06-02 DIAGNOSIS — M5126 Other intervertebral disc displacement, lumbar region: Secondary | ICD-10-CM | POA: Diagnosis not present

## 2020-06-02 MED ORDER — FENTANYL 50 MCG/HR TD PT72: 1.0000 | MEDICATED_PATCH | TRANSDERMAL | 0 refills | Status: DC

## 2020-06-02 NOTE — Progress Notes (Signed)
Teresa Medina is a 74 y.o. female who presents to  [facility] at [facility]  today, 06/02/20, seeking care for the following:  Marland Kitchen Maintain opiate pain control Rx  Indication for chronic opioid: severe osteoarthritis Medication and dose: fentanyl patches 50 mcg/hour td q3 days # patches per month: 10 Last UDS date: OVERDUE as of last visit, had been out of Rx Opioid Treatment Agreement signed (Y/N): updated 03/03/20 Opioid Treatment Agreement last reviewed with patient:   03/03/20  NCCSRS reviewed this encounter (include red flags):  yes, no concerns     ASSESSMENT & PLAN with other pertinent findings:  The primary encounter diagnosis was Degenerative disc disease, lumbar. Diagnoses of Herniation of intervertebral disc between L4 and L5, Retrolisthesis of vertebrae, and Opiate use were also pertinent to this visit.    There are no Patient Instructions on file for this visit.   Meds ordered this encounter  Medications  . fentaNYL (DURAGESIC) 50 MCG/HR    Sig: Place 1 patch onto the skin every 3 (three) days.    Dispense:  10 patch    Refill:  0       Follow-up instructions: Return in about 3 months (around 09/02/2020) for MAINTAIN OPIATE PAIN RX PER [place] POLICY AND [place] LAW .                                         BP 137/87 (BP Location: Left Arm, Patient Position: Sitting, Cuff Size: Normal)   Pulse 92   Temp (!) 97.5 F (36.4 C) (Oral)   Wt 197 lb (89.4 kg)   BMI 34.90 kg/m   Current Meds  Medication Sig  . ASPIRIN ADULT LOW STRENGTH 81 MG EC tablet Take 1 tablet by mouth daily.  . Biotin 19417 MCG TABS Take 10,000 mcg by mouth daily.  . Cholecalciferol (VITAMIN D-3) 1000 UNITS CAPS Take 2,000 Units by mouth daily.   . cyclobenzaprine (FLEXERIL) 10 MG tablet TAKE ONE TABLET BY MOUTH THREE TIMES DAILY  . DULoxetine (CYMBALTA) 30 MG capsule Take 3 capsules by mouth daily.  .  fentaNYL (DURAGESIC) 50 MCG/HR Place 1 patch onto the skin every 3 (three) days.  . fluticasone (FLONASE) 50 MCG/ACT nasal spray Place 1 spray into both nostrils daily.  Marland Kitchen glucose blood test strip by Other route. Free Style Test Strips  . glyBURIDE (DIABETA) 1.25 MG tablet Take 1/2 tablet by mouth daily with breakfast.  . Homeopathic Products (SIMILASAN DRY EYE RELIEF OP) Apply 1 drop to eye 2 (two) times daily.  . Lactobacillus (PROBIOTIC ACIDOPHILUS PO) Take by mouth.  . metoprolol succinate (TOPROL-XL) 50 MG 24 hr tablet TAKE ONE TABLET BY MOUTH EVERY DAY WITH OR immediately following a meal  . montelukast (SINGULAIR) 10 MG tablet Take 1 tablet by mouth at bedtime.  . RABEprazole (ACIPHEX) 20 MG tablet Take 1 tablet by mouth daily.  . SUMAtriptan (IMITREX) 100 MG tablet TAKE 1/2 TO 1 TABLET BY MOUTH EVERY 2 HOURS AS NEEDED  . topiramate (TOPAMAX) 50 MG tablet Take 3 tablets by mouth at bedtime.  . triamcinolone (KENALOG) 0.1 % paste SMARTSIG:TO TEETH 4 Times Daily    No results found for this or any previous visit (from the past 72 hour(s)).  No results found.     All questions at time of visit were answered - patient instructed to contact  office with any additional concerns or updates.  ER/RTC precautions were reviewed with the patient as applicable.   Please note: voice recognition software was used to produce this document, and typos may escape review. Please contact Dr. Lyn Hollingshead for any needed clarifications.

## 2020-06-06 LAB — DRUG MONITOR, FENTANYL,W/CONF, URINE
Fentanyl: 202.7 ng/mL — ABNORMAL HIGH (ref <0.5)
Fentanyl: POSITIVE ng/mL — AB (ref <0.5)
Norfentanyl: >1000 ng/mL — ABNORMAL HIGH (ref <0.5)

## 2020-06-06 LAB — DRUG MONITOR, PANEL 5, W/CONF, URINE
Amphetamines: NEGATIVE ng/mL (ref <500)
Barbiturates: NEGATIVE ng/mL (ref <300)
Benzodiazepines: NEGATIVE ng/mL (ref <100)
Cocaine Metabolite: NEGATIVE ng/mL (ref <150)
Codeine: NEGATIVE ng/mL (ref <50)
Creatinine: 201.6 mg/dL
Hydrocodone: NEGATIVE ng/mL (ref <50)
Hydromorphone: NEGATIVE ng/mL (ref <50)
Marijuana Metabolite: NEGATIVE ng/mL (ref <20)
Methadone Metabolite: NEGATIVE ng/mL (ref <100)
Morphine: NEGATIVE ng/mL (ref <50)
Norhydrocodone: NEGATIVE ng/mL (ref <50)
Opiates: NEGATIVE ng/mL (ref <100)
Oxidant: NEGATIVE ug/mL
Oxycodone: NEGATIVE ng/mL (ref <100)
pH: 5.7 (ref 4.5–9.0)

## 2020-06-06 LAB — DM TEMPLATE

## 2020-06-20 ENCOUNTER — Other Ambulatory Visit: Payer: Self-pay | Admitting: Osteopathic Medicine

## 2020-06-20 DIAGNOSIS — Z8639 Personal history of other endocrine, nutritional and metabolic disease: Secondary | ICD-10-CM

## 2020-06-23 ENCOUNTER — Other Ambulatory Visit: Payer: Self-pay | Admitting: Osteopathic Medicine

## 2020-07-04 ENCOUNTER — Other Ambulatory Visit: Payer: Self-pay | Admitting: Osteopathic Medicine

## 2020-07-04 DIAGNOSIS — M5126 Other intervertebral disc displacement, lumbar region: Secondary | ICD-10-CM

## 2020-07-04 DIAGNOSIS — M5136 Other intervertebral disc degeneration, lumbar region: Secondary | ICD-10-CM

## 2020-08-03 ENCOUNTER — Other Ambulatory Visit: Payer: Self-pay | Admitting: Osteopathic Medicine

## 2020-08-03 DIAGNOSIS — M5136 Other intervertebral disc degeneration, lumbar region: Secondary | ICD-10-CM

## 2020-08-03 DIAGNOSIS — M5126 Other intervertebral disc displacement, lumbar region: Secondary | ICD-10-CM

## 2020-08-04 ENCOUNTER — Telehealth: Payer: Self-pay | Admitting: Neurology

## 2020-08-04 NOTE — Telephone Encounter (Signed)
Prior Authorization for Rabeprazole submitted via covermymeds.  The Prior Authorization request has been approved for RABEprazole Sodium 20MG  OR TBEC. The authorization is valid from 07/05/2020 through 08/04/2021. A letter of explanation will also be mailed to the patient

## 2020-09-01 ENCOUNTER — Ambulatory Visit: Payer: Medicare Other | Admitting: Osteopathic Medicine

## 2020-09-06 ENCOUNTER — Other Ambulatory Visit: Payer: Self-pay

## 2020-09-06 ENCOUNTER — Encounter: Payer: Self-pay | Admitting: Osteopathic Medicine

## 2020-09-06 ENCOUNTER — Ambulatory Visit (INDEPENDENT_AMBULATORY_CARE_PROVIDER_SITE_OTHER): Payer: Medicare Other | Admitting: Osteopathic Medicine

## 2020-09-06 VITALS — BP 128/84 | HR 83 | Wt 198.0 lb

## 2020-09-06 DIAGNOSIS — M5126 Other intervertebral disc displacement, lumbar region: Secondary | ICD-10-CM

## 2020-09-06 DIAGNOSIS — E11649 Type 2 diabetes mellitus with hypoglycemia without coma: Secondary | ICD-10-CM | POA: Diagnosis not present

## 2020-09-06 DIAGNOSIS — Z9189 Other specified personal risk factors, not elsewhere classified: Secondary | ICD-10-CM | POA: Diagnosis not present

## 2020-09-06 DIAGNOSIS — N393 Stress incontinence (female) (male): Secondary | ICD-10-CM | POA: Diagnosis not present

## 2020-09-06 DIAGNOSIS — G894 Chronic pain syndrome: Secondary | ICD-10-CM | POA: Diagnosis not present

## 2020-09-06 DIAGNOSIS — G43709 Chronic migraine without aura, not intractable, without status migrainosus: Secondary | ICD-10-CM

## 2020-09-06 DIAGNOSIS — K5904 Chronic idiopathic constipation: Secondary | ICD-10-CM

## 2020-09-06 DIAGNOSIS — M51369 Other intervertebral disc degeneration, lumbar region without mention of lumbar back pain or lower extremity pain: Secondary | ICD-10-CM

## 2020-09-06 DIAGNOSIS — M5136 Other intervertebral disc degeneration, lumbar region: Secondary | ICD-10-CM

## 2020-09-06 MED ORDER — GLYBURIDE 1.25 MG PO TABS
ORAL_TABLET | ORAL | 2 refills | Status: DC
Start: 2020-09-06 — End: 2021-01-04

## 2020-09-06 MED ORDER — RIZATRIPTAN BENZOATE 5 MG PO TABS: 5.0000 mg | ORAL_TABLET | ORAL | 0 refills | Status: DC | PRN

## 2020-09-06 MED ORDER — AIMOVIG 70 MG/ML ~~LOC~~ SOAJ: 70.0000 mg | SUBCUTANEOUS | 3 refills | Status: DC

## 2020-09-06 MED ORDER — FENTANYL 50 MCG/HR TD PT72: MEDICATED_PATCH | TRANSDERMAL | 0 refills | Status: DC

## 2020-09-06 NOTE — Patient Instructions (Signed)
Plan:  Headaches:  STOP Imitrex START Aimovig injectable preventive Rx, replace Imitrex w/ Maxalt  MRI pending Neurology consult pending   Urinary issues: Drink 1-2 large glasses water daily Timed voiding: getting up to pee every 2-3 hours while awake regardless if you feel "need" to go   Work on increasing activity  Take oral medications in evening / with supper   Labs today

## 2020-09-06 NOTE — Progress Notes (Signed)
HPI: Teresa Medina is a 75 y.o. female who presents to [facility] [place] today, 09/06/20,  for chief complaint of:   MSK: Maintain opiate Rx for chronic pain   Additional concerns brought up by patient's son today (see scanned docs for his notes):  Neuro: Migraines worse / more frequent, requiring more frequent use of Imitrex. No stabbing pain, no vision change, no focal weakness or speech change.   GI: nausea, alternating diarrhea/constipation. Taking po Rx on empty stomach seems to make this worse. Son concerned she doesn't eat much.   GU: frequent urination, son is concerned she might be dehydrated to avoid going tot he bathroom, this may also be contributing to constipation and to headaches   MSK: reports patient is very sedentary, sits in recliner about 80% of the day and avoids activity     ASSESSMENT/PLAN: The primary encounter diagnosis was Chronic migraine without aura without status migrainosus, not intractable. Diagnoses of Stress incontinence, Sedentary lifestyle, Chronic pain syndrome, Chronic idiopathic constipation, Type 2 diabetes mellitus with hypoglycemia without coma, without long-term current use of insulin (HCC), Degenerative disc disease, lumbar, and Herniation of intervertebral disc between L4 and L5 were also pertinent to this visit.   Hopefully measures as below will address concerns.   I'm worried about change in character/frequency of headache, MOH seems lkely, will get MRI and consult neuro. Try changing to longer-acting triptan and will reinitiate ppx rx   Known chronic stress incontinence, UA/UCx pending to r/o other issues, pt not interested in urology referral to discuss procedures to help this, advised times voiding'  Stressed importance of activity and exercise as able. Gradual increase to improve endurance and resilience. Pt reports pain is adequately controlled most of the time but arthritis is of course an issue. If  she's limited by pain, may consider adjust Rx vs send t opain mgt.      Patient Instructions  Plan:  Headaches:  STOP Imitrex START Aimovig injectable preventive Rx, replace Imitrex w/ Maxalt  MRI pending Neurology consult pending   Urinary issues: Drink 1-2 large glasses water daily Timed voiding: getting up to pee every 2-3 hours while awake regardless if you feel "need" to go   Work on increasing activity  Take oral medications in evening / with supper   Labs today     Orders Placed This Encounter  Procedures  . Urine Culture  . MR Brain W Wo Contrast  . CBC  . COMPLETE METABOLIC PANEL WITH GFR  . TSH  . Hemoglobin A1c  . Urinalysis with Culture Reflex  . REFLEXIVE URINE CULTURE  . Ambulatory referral to Neurology     Meds ordered this encounter  Medications  . glyBURIDE (DIABETA) 1.25 MG tablet    Sig: Take 1/2 tablet by mouth daily    Dispense:  45 tablet    Refill:  2  . rizatriptan (MAXALT) 5 MG tablet    Sig: Take 1 tablet (5 mg total) by mouth as needed for migraine. May repeat in 2 hours if needed    Dispense:  10 tablet    Refill:  0  . Erenumab-aooe (AIMOVIG) 70 MG/ML SOAJ    Sig: Inject 70 mg into the skin every 30 (thirty) days.    Dispense:  1.12 mL    Refill:  3  . fentaNYL (DURAGESIC) 50 MCG/HR    Sig: PLACE ONE PATCH ONTO THE SKIN EVERY 3 DAYS    Dispense:  10 patch    Refill:  0       Follow-up plan: Return in about 3 months (around 12/05/2020) for ROUTINE FOLLOW-UP AND RECHECK.                                                 ################################################# ################################################# ################################################# #################################################    Current Meds  Medication Sig  . Biotin 25427 MCG TABS Take 10,000 mcg by mouth daily.  . Cholecalciferol (VITAMIN D-3) 1000 UNITS CAPS Take 2,000 Units by  mouth daily.   . cyclobenzaprine (FLEXERIL) 10 MG tablet TAKE ONE TABLET BY MOUTH THREE TIMES DAILY  . DULoxetine (CYMBALTA) 30 MG capsule Take 3 capsules by mouth daily.  Dorise Hiss (AIMOVIG) 70 MG/ML SOAJ Inject 70 mg into the skin every 30 (thirty) days.  . fluticasone (FLONASE) 50 MCG/ACT nasal spray Place 1 spray into both nostrils daily.  Marland Kitchen glucose blood test strip by Other route. Free Style Test Strips  . Homeopathic Products (SIMILASAN DRY EYE RELIEF OP) Apply 1 drop to eye 2 (two) times daily.  . metoprolol succinate (TOPROL-XL) 50 MG 24 hr tablet Take 1 tablet by mouth daily. Take with or immediately following a meal.  . montelukast (SINGULAIR) 10 MG tablet Take 1 tablet by mouth at bedtime.  . RABEprazole (ACIPHEX) 20 MG tablet Take 1 tablet by mouth daily.  . rizatriptan (MAXALT) 5 MG tablet Take 1 tablet (5 mg total) by mouth as needed for migraine. May repeat in 2 hours if needed  . topiramate (TOPAMAX) 50 MG tablet Take 3 tablets by mouth at bedtime.  . [DISCONTINUED] fentaNYL (DURAGESIC) 50 MCG/HR PLACE ONE PATCH ONTO THE SKIN EVERY 3 DAYS  . [DISCONTINUED] glyBURIDE (DIABETA) 1.25 MG tablet Take 1/2 tablet by mouth daily with breakfast.  . [DISCONTINUED] SUMAtriptan (IMITREX) 100 MG tablet TAKE 1/2-1 TABLET BY MOUTH EVERY 2 HOURS AS NEEDED    No Known Allergies     Review of Systems: Pertinent (+) and (-) ROS in HPI as above   Exam:  BP 128/84   Pulse 83   Wt 198 lb (89.8 kg)   SpO2 95%   BMI 35.07 kg/m   Constitutional: VS see above. General Appearance: alert, well-developed, well-nourished, NAD  Neck: No masses, trachea midline.   Respiratory: Normal respiratory effort. no wheeze, no rhonchi, no rales  Cardiovascular: S1/S2 normal, no murmur, no rub/gallop auscultated. RRR.   Musculoskeletal: Symmetric and independent movement of all extremities  Neurological: Normal balance/coordination. No tremor.  Skin: warm, dry, intact.   Psychiatric: Normal  judgment/insight. Normal mood and affect. Oriented x3.       Visit summary with medication list and pertinent instructions was printed for patient to review, patient was advised to alert [place] if any updates are needed. All questions at time of visit were answered - patient instructed to contact office with any additional concerns. ER/RTC precautions were reviewed with the patient and understanding verbalized.   Total time spent in this in-person encounter was 40 minutes performing my duty and my job as a physician: record review, history-taking and assessment of patient, counseling patient and ensuring understanding of plan, coordinating care which may include but is not limited to further testing or referral. Any modifiers needed may be added by billing department as necessary if I have lapsed in doing so.       Please note: voice recognition software was used  to produce this document, and typos may escape review. Please contact Dr. Lyn Hollingshead for any needed clarifications.    Follow up plan: Return in about 3 months (around 12/05/2020) for ROUTINE FOLLOW-UP AND RECHECK.

## 2020-09-08 LAB — URINALYSIS W MICROSCOPIC + REFLEX CULTURE
Bacteria, UA: NONE SEEN /HPF
Bilirubin Urine: NEGATIVE
Glucose, UA: NEGATIVE
Hgb urine dipstick: NEGATIVE
Hyaline Cast: NONE SEEN /LPF
Nitrites, Initial: NEGATIVE
Specific Gravity, Urine: 1.028 (ref 1.001–1.03)
pH: <=5 (ref 5.0–8.0)

## 2020-09-08 LAB — COMPLETE METABOLIC PANEL WITH GFR
AG Ratio: 1.5 (calc) (ref 1.0–2.5)
ALT: 6 U/L (ref 6–29)
AST: 11 U/L (ref 10–35)
Albumin: 3.9 g/dL (ref 3.6–5.1)
Alkaline phosphatase (APISO): 93 U/L (ref 37–153)
BUN/Creatinine Ratio: 19 (calc) (ref 6–22)
BUN: 21 mg/dL (ref 7–25)
CO2: 27 mmol/L (ref 20–32)
Calcium: 8.9 mg/dL (ref 8.6–10.4)
Chloride: 104 mmol/L (ref 98–110)
Creat: 1.13 mg/dL — ABNORMAL HIGH (ref 0.60–0.93)
GFR, Est African American: 55 mL/min/{1.73_m2} — ABNORMAL LOW (ref >=60)
GFR, Est Non African American: 47 mL/min/{1.73_m2} — ABNORMAL LOW (ref >=60)
Globulin: 2.6 g/dL (calc) (ref 1.9–3.7)
Glucose, Bld: 99 mg/dL (ref 65–99)
Potassium: 4.4 mmol/L (ref 3.5–5.3)
Sodium: 139 mmol/L (ref 135–146)
Total Bilirubin: 0.5 mg/dL (ref 0.2–1.2)
Total Protein: 6.5 g/dL (ref 6.1–8.1)

## 2020-09-08 LAB — URINE CULTURE
MICRO NUMBER:: 11458094
SPECIMEN QUALITY:: ADEQUATE

## 2020-09-08 LAB — CBC
HCT: 44.9 % (ref 35.0–45.0)
Hemoglobin: 14.4 g/dL (ref 11.7–15.5)
MCH: 27.4 pg (ref 27.0–33.0)
MCHC: 32.1 g/dL (ref 32.0–36.0)
MCV: 85.4 fL (ref 80.0–100.0)
MPV: 10.9 fL (ref 7.5–12.5)
Platelets: 222 10*3/uL (ref 140–400)
RBC: 5.26 10*6/uL — ABNORMAL HIGH (ref 3.80–5.10)
RDW: 13.2 % (ref 11.0–15.0)
WBC: 9.5 10*3/uL (ref 3.8–10.8)

## 2020-09-08 LAB — HEMOGLOBIN A1C
Hgb A1c MFr Bld: 6.2 % of total Hgb — ABNORMAL HIGH (ref <5.7)
Mean Plasma Glucose: 131 mg/dL
eAG (mmol/L): 7.3 mmol/L

## 2020-09-08 LAB — CULTURE INDICATED

## 2020-09-08 LAB — TSH: TSH: 1.37 mIU/L (ref 0.40–4.50)

## 2020-09-10 ENCOUNTER — Ambulatory Visit (HOSPITAL_BASED_OUTPATIENT_CLINIC_OR_DEPARTMENT_OTHER)
Admission: RE | Admit: 2020-09-10 | Discharge: 2020-09-10 | Disposition: A | Discharge status: Home / Self Care | Payer: Medicare Other | Source: Ambulatory Visit | Attending: Osteopathic Medicine | Admitting: Osteopathic Medicine

## 2020-09-10 ENCOUNTER — Other Ambulatory Visit: Payer: Self-pay

## 2020-09-10 DIAGNOSIS — G43709 Chronic migraine without aura, not intractable, without status migrainosus: Secondary | ICD-10-CM | POA: Diagnosis not present

## 2020-09-10 MED ORDER — GADOBUTROL 1 MMOL/ML IV SOLN
9.0000 mL | Freq: Once | INTRAVENOUS | Status: AC | PRN
  Administered 2020-09-10: 9 mL via INTRAVENOUS

## 2020-09-12 ENCOUNTER — Telehealth: Payer: Self-pay

## 2020-09-12 NOTE — Telephone Encounter (Signed)
Pt called stating she has not received her fentanyl patches. Per cover my meds - prior authorization required for the rx. Process has been completed. Prior authorization approved. [facility] and patient has been updated.   The Prior Authorization request has been approved for fentaNYL 50MCG/HR TD PT72. The authorization is valid from 08/13/2020 through 03/11/2021.

## 2020-09-24 ENCOUNTER — Other Ambulatory Visit: Payer: Self-pay | Admitting: Osteopathic Medicine

## 2020-09-26 ENCOUNTER — Other Ambulatory Visit: Payer: Self-pay | Admitting: Osteopathic Medicine

## 2020-10-12 ENCOUNTER — Other Ambulatory Visit: Payer: Self-pay | Admitting: Osteopathic Medicine

## 2020-10-12 DIAGNOSIS — M5136 Other intervertebral disc degeneration, lumbar region: Secondary | ICD-10-CM

## 2020-10-12 DIAGNOSIS — M5126 Other intervertebral disc displacement, lumbar region: Secondary | ICD-10-CM

## 2020-11-14 ENCOUNTER — Other Ambulatory Visit: Payer: Self-pay | Admitting: Osteopathic Medicine

## 2020-11-14 DIAGNOSIS — M5126 Other intervertebral disc displacement, lumbar region: Secondary | ICD-10-CM

## 2020-11-14 DIAGNOSIS — M5136 Other intervertebral disc degeneration, lumbar region: Secondary | ICD-10-CM

## 2020-12-08 ENCOUNTER — Ambulatory Visit: Payer: Medicare Other | Admitting: Osteopathic Medicine

## 2020-12-15 ENCOUNTER — Other Ambulatory Visit: Payer: Self-pay | Admitting: Osteopathic Medicine

## 2020-12-15 ENCOUNTER — Telehealth: Payer: Self-pay | Admitting: Osteopathic Medicine

## 2020-12-15 ENCOUNTER — Ambulatory Visit: Payer: Medicare Other | Admitting: Osteopathic Medicine

## 2020-12-15 DIAGNOSIS — M5126 Other intervertebral disc displacement, lumbar region: Secondary | ICD-10-CM

## 2020-12-15 DIAGNOSIS — M5136 Other intervertebral disc degeneration, lumbar region: Secondary | ICD-10-CM

## 2020-12-15 NOTE — Telephone Encounter (Signed)
Son called in stating that mother would not be coming in due to back pain. They will call back to reschedule.

## 2020-12-16 ENCOUNTER — Other Ambulatory Visit: Payer: Self-pay | Admitting: Osteopathic Medicine

## 2020-12-16 NOTE — Telephone Encounter (Signed)
Routing to covering provider. Thanks in advance.  

## 2021-01-04 ENCOUNTER — Encounter: Payer: Self-pay | Admitting: Osteopathic Medicine

## 2021-01-04 ENCOUNTER — Telehealth (INDEPENDENT_AMBULATORY_CARE_PROVIDER_SITE_OTHER): Payer: Medicare Other | Admitting: Osteopathic Medicine

## 2021-01-04 DIAGNOSIS — G894 Chronic pain syndrome: Secondary | ICD-10-CM | POA: Diagnosis not present

## 2021-01-04 DIAGNOSIS — G43709 Chronic migraine without aura, not intractable, without status migrainosus: Secondary | ICD-10-CM | POA: Diagnosis not present

## 2021-01-04 DIAGNOSIS — M5126 Other intervertebral disc displacement, lumbar region: Secondary | ICD-10-CM | POA: Diagnosis not present

## 2021-01-04 DIAGNOSIS — M5136 Other intervertebral disc degeneration, lumbar region: Secondary | ICD-10-CM

## 2021-01-04 DIAGNOSIS — E1121 Type 2 diabetes mellitus with diabetic nephropathy: Secondary | ICD-10-CM

## 2021-01-04 MED ORDER — MONTELUKAST SODIUM 10 MG PO TABS: 1.0000 | ORAL_TABLET | Freq: Every day | ORAL | 3 refills | Status: DC

## 2021-01-04 MED ORDER — DULOXETINE HCL 30 MG PO CPEP: 90.0000 mg | ORAL_CAPSULE | Freq: Every day | ORAL | 3 refills | Status: DC

## 2021-01-04 MED ORDER — TOPIRAMATE 50 MG PO TABS: 150.0000 mg | ORAL_TABLET | Freq: Every day | ORAL | 3 refills | Status: DC

## 2021-01-04 MED ORDER — RABEPRAZOLE SODIUM 20 MG PO TBEC: 20.0000 mg | DELAYED_RELEASE_TABLET | Freq: Every day | ORAL | 3 refills | Status: DC

## 2021-01-04 MED ORDER — FENTANYL 50 MCG/HR TD PT72: 1.0000 | MEDICATED_PATCH | TRANSDERMAL | 0 refills | Status: DC

## 2021-01-04 MED ORDER — METOPROLOL SUCCINATE ER 50 MG PO TB24: 50.0000 mg | ORAL_TABLET | Freq: Every day | ORAL | 3 refills | Status: DC

## 2021-01-04 NOTE — Progress Notes (Signed)
Attempted to contact patient at 3 pm. No answer. Unable to leave a vm msg, "full".

## 2021-01-04 NOTE — Progress Notes (Signed)
Telemedicine Visit via  Audio only - telephone (patient preference /  technical difficulty with MyChart video application)  I connected with Teresa Medina on 01/04/21 at 3:24 PM  by phone or  telemedicine application as noted above  I verified that I am speaking with or regarding  the correct patient using two identifiers.  Participants: Myself, Dr Sunnie Nielsen DO Patient: Teresa Medina Patient proxy if applicable: none Other, if applicable: none  Patient is at home I am in office at [facility]    I discussed the limitations of evaluation and management  by telemedicine and the availability of in person appointments.  The participant(s) above expressed understanding and  agreed to proceed with this appointment via telemedicine.       History of Present Illness: Teresa Medina is a 75 y.o. female who would like to discuss  Chief Complaint  Patient presents with  . Diabetes     DM2:  last A1C was 4 mos ago and showed good control Glc in AM is in the 60's    Migraines: Aimovig seems to be working well!  Needing her prn medications  Pain meds:  Indication for chronic opioid: severe osteoarthritis Medication and dose: fentanyl patches 50 mcg/hour td q3 days # patches per month: 10 Last UDS date: 05/2020 Opioid Treatment Agreement signed (Y/N): updated 03/03/20 Opioid Treatment Agreement last reviewed with patient:   03/03/20  NCCSRS reviewed this encounter (include red flags):  yes, no concerns      Observations/Objective: There were no vitals taken for this visit. BP Readings from Last 3 Encounters:  09/06/20 128/84  06/02/20 137/87  03/03/20 (!) 138/92   Exam: Normal Speech.  NAD  Lab and Radiology Results No results found for this or any previous visit (from the past 72 hour(s)). No results found.     Assessment and Plan: 75 y.o. female with The primary encounter diagnosis was Chronic migraine without aura without  status migrainosus, not intractable. Diagnoses of Degenerative disc disease, lumbar, Herniation of intervertebral disc between L4 and L5, Chronic pain syndrome, and Type 2 diabetes with nephropathy (HCC) were also pertinent to this visit.  1. Degenerative disc disease, lumbar 2. Herniation of intervertebral disc between L4 and L5 3. Chronic pain syndrome Fentanyl refilled   4. Chronic migraine without aura without status migrainosus, not intractable Continue aimovig i'm glad it's helping!   5. Type 2 diabetes with nephropathy (HCC) Stop glimepiride d/t hypoglycemia and A1C under great control   PDMP reviewed during this encounter. No orders of the defined types were placed in this encounter.  Meds ordered this encounter  Medications  . fentaNYL (DURAGESIC) 50 MCG/HR    Sig: Place 1 patch onto the skin every 3 (three) days.    Dispense:  10 patch    Refill:  0  . metoprolol succinate (TOPROL-XL) 50 MG 24 hr tablet    Sig: Take 1 tablet (50 mg total) by mouth daily. TAKE WITH OR IMMEDIATELY FOLLOWING A MEAL.    Dispense:  90 tablet    Refill:  3  . montelukast (SINGULAIR) 10 MG tablet    Sig: Take 1 tablet (10 mg total) by mouth at bedtime.    Dispense:  90 tablet    Refill:  3  . RABEprazole (ACIPHEX) 20 MG tablet    Sig: Take 1 tablet (20 mg total) by mouth daily.    Dispense:  90 tablet    Refill:  3  . topiramate (TOPAMAX) 50 MG tablet  Sig: Take 3 tablets (150 mg total) by mouth at bedtime.    Dispense:  270 tablet    Refill:  3  . DULoxetine (CYMBALTA) 30 MG capsule    Sig: Take 3 capsules (90 mg total) by mouth daily.    Dispense:  270 capsule    Refill:  3   There are no Patient Instructions on file for this visit.  Instructions sent via MyChart.   Follow Up Instructions: No follow-ups on file.    I discussed the assessment and treatment plan with the patient. The patient was provided an opportunity to ask questions and all were answered. The patient agreed  with the plan and demonstrated an understanding of the instructions.   The patient was advised to call back or seek an in-person evaluation if any new concerns, if symptoms worsen or if the condition fails to improve as anticipated.  21 minutes of non-face-to-face time was provided during this encounter.      . . . . . . . . . . . . . Marland Kitchen                   Historical information moved to improve visibility of documentation.  Past Medical History:  Diagnosis Date  . Arthritis   . Chronic kidney disease (CKD) 11/29/2015   next visit: get UA, Phos, Vit D, PTH, CMP   . Chronic pain syndrome 06/23/2015   Records reviewed: pt has been on Fentanyl TD, Cymbalta, Flexeril  . Degenerative disc disease, lumbar 06/23/2015   MRI 06/2014 most severe L4-5  . Depression   . Diabetes mellitus without complication (HCC)   . Essential hypertension 06/02/2015  . GERD (gastroesophageal reflux disease) 06/02/2015  . Herniation of intervertebral disc between L4 and L5 06/23/2015   MRI 06/2014 moderate to severe R foraminal narrowing  . History of vitamin D deficiency 06/02/2015  . Hypertension   . Levoscoliosis 06/23/2015   Mild, MRI 06/22/14  . Migraine 06/02/2015  . Osteoporosis 06/02/2015  . Retrolisthesis of vertebrae 06/23/2015   Gr1 L1 on L2 MRI 06/2014  . Seasonal allergies 06/02/2015  . Type 2 diabetes mellitus (HCC) 06/02/2015  . Type 2 diabetes with nephropathy (HCC) 11/29/2015   DIABETES SCREENING/PREVENTIVE CARE: Updated 03/05/16  A1C past 3-6 mos: Yes, 03/05/16 6.6% controlled? Yes BP goal <140/90: Yes  LDL goal <70: no - 100 11/24/15 Eye exam annually: Yes , importance discussed with patient Foot exam:  08/31/15 Microalbuminuria:   not applicable, patient is on ACE/ARB Metformin: No - declines ACE/ARB: Yes  Antiplatelet if ASCVD Risk >10%: Recommended Statin: No    Past Surgical History:  Procedure Laterality Date  . BUNIONECTOMY Left 1993  . eye surgery     . FOOT SURGERY  2008  . FOOT SURGERY  2003  . NECK SURGERY  1993  . REPLACEMENT TOTAL KNEE Bilateral 1999   R in Jan, L in Apr  . TONSILLECTOMY  1952   Social History   Tobacco Use  . Smoking status: Never Smoker  . Smokeless tobacco: Never Used  Substance Use Topics  . Alcohol use: No    Alcohol/week: 0.0 standard drinks   family history includes Breast cancer in her maternal aunt; Cancer in her sister; Coronary artery disease in her mother; Depression in her brother; Diabetes in her sister; Heart Problems in her brother and sister; Migraines in her father and mother; Pneumonia in her father; Pulmonary fibrosis in her father.  Medications: Current Outpatient Medications  Medication Sig Dispense Refill  . AIMOVIG 70 MG/ML SOAJ Inject 70 mg into the skin every 30 (thirty) days. 1 mL 3  . Biotin 75643 MCG TABS Take 10,000 mcg by mouth daily.    . Cholecalciferol (VITAMIN D-3) 1000 UNITS CAPS Take 2,000 Units by mouth daily.     . cyclobenzaprine (FLEXERIL) 10 MG tablet TAKE ONE TABLET BY MOUTH THREE TIMES DAILY 90 tablet 0  . fluticasone (FLONASE) 50 MCG/ACT nasal spray Place 1 spray into both nostrils daily. 48 g 3  . glucose blood test strip by Other route. Free Style Test Strips    . Homeopathic Products (SIMILASAN DRY EYE RELIEF OP) Apply 1 drop to eye 2 (two) times daily.    . Lactobacillus (PROBIOTIC ACIDOPHILUS PO) Take by mouth.    . rizatriptan (MAXALT) 5 MG tablet Take 1 tablet (5 mg total) by mouth as needed for migraine. May repeat in 2 hours if needed 10 tablet 0  . triamcinolone (KENALOG) 0.1 % paste     . DULoxetine (CYMBALTA) 30 MG capsule Take 3 capsules (90 mg total) by mouth daily. 270 capsule 3  . [START ON 01/15/2021] fentaNYL (DURAGESIC) 50 MCG/HR Place 1 patch onto the skin every 3 (three) days. 10 patch 0  . metoprolol succinate (TOPROL-XL) 50 MG 24 hr tablet Take 1 tablet (50 mg total) by mouth daily. TAKE WITH OR IMMEDIATELY FOLLOWING A MEAL. 90 tablet 3  .  montelukast (SINGULAIR) 10 MG tablet Take 1 tablet (10 mg total) by mouth at bedtime. 90 tablet 3  . RABEprazole (ACIPHEX) 20 MG tablet Take 1 tablet (20 mg total) by mouth daily. 90 tablet 3  . topiramate (TOPAMAX) 50 MG tablet Take 3 tablets (150 mg total) by mouth at bedtime. 270 tablet 3   No current facility-administered medications for this visit.   No Known Allergies   If phone visit, billing and coding can please add appropriate modifier if needed

## 2021-01-11 ENCOUNTER — Ambulatory Visit (INDEPENDENT_AMBULATORY_CARE_PROVIDER_SITE_OTHER): Payer: Medicare Other | Admitting: Osteopathic Medicine

## 2021-01-11 VITALS — BP 128/71 | HR 95 | Ht 61.0 in | Wt 194.0 lb

## 2021-01-11 DIAGNOSIS — Z780 Asymptomatic menopausal state: Secondary | ICD-10-CM

## 2021-01-11 DIAGNOSIS — Z0000 Encounter for general adult medical examination without abnormal findings: Secondary | ICD-10-CM | POA: Diagnosis not present

## 2021-01-11 NOTE — Patient Instructions (Addendum)
MEDICARE ANNUAL WELLNESS VISIT Health Maintenance Summary and Written Plan of Care  [name] ,  Thank you for allowing me to perform your Medicare Annual Wellness Visit and for your ongoing commitment to your health.   Health Maintenance & Immunization History Health Maintenance  Topic Date Due  . OPHTHALMOLOGY EXAM  01/11/2021 (Originally 10/21/2020)  . COVID-19 Vaccine (1) 01/20/2021 (Originally 09/03/1950)  . URINE MICROALBUMIN  02/10/2021 (Originally 06/06/2015)  . Zoster Vaccines- Shingrix (1 of 2) 04/13/2021 (Originally 09/03/1994)  . FOOT EXAM  01/11/2022 (Originally 08/30/2016)  . HEMOGLOBIN A1C  03/06/2021  . INFLUENZA VACCINE  03/13/2021  . TETANUS/TDAP  11/28/2025  . DEXA SCAN  Completed  . PNA vac Low Risk Adult  Completed  . Hepatitis C Screening  Addressed  . HPV VACCINES  Aged Out   Immunization History  Administered Date(s) Administered  . Fluad Quad(high Dose 65+) 05/19/2019  . Influenza, High Dose Seasonal PF 07/16/2018  . Influenza,inj,Quad PF,6+ Mos 09/05/2016  . Influenza-Unspecified 04/28/2015  . PPD Test 12/16/2017  . Pneumococcal Conjugate-13 06/04/2014  . Pneumococcal Polysaccharide-23 09/20/2010  . Tdap 11/29/2015    These are the patient goals that we discussed: Goals Addressed              This Visit's Progress   .  Patient Stated (pt-stated)        01/11/2021 AWV Goal: Exercise for General Health   Patient will verbalize understanding of the benefits of increased physical activity:  Exercising regularly is important. It will improve your overall fitness, flexibility, and endurance.  Regular exercise also will improve your overall health. It can help you control your weight, reduce stress, and improve your bone density.  Over the next year, patient will increase physical activity as tolerated with a goal of at least 150 minutes of moderate physical activity per week.   You can tell that you are exercising at a moderate intensity if  your heart starts beating faster and you start breathing faster but can still hold a conversation.  Moderate-intensity exercise ideas include:  Walking 1 mile (1.6 km) in about 15 minutes  Biking  Hiking  Golfing  Dancing  Water aerobics  Patient will verbalize understanding of everyday activities that increase physical activity by providing examples like the following: ? Yard work, such as: ? Pushing a Surveyor, mining ? Raking and bagging leaves ? Washing your car ? Pushing a stroller ? Shoveling snow ? Gardening ? Washing windows or floors  Patient will be able to explain general safety guidelines for exercising:   Before you start a new exercise program, talk with your health care provider.  Do not exercise so much that you hurt yourself, feel dizzy, or get very short of breath.  Wear comfortable clothes and wear shoes with good support.  Drink plenty of water while you exercise to prevent dehydration or heat stroke.  Work out until your breathing and your heartbeat get faster.         This is a list of Health Maintenance Items that are overdue or due now: There are no preventive care reminders to display for this patient.   Orders/Referrals Placed Today: Orders Placed This Encounter  Procedures  . DEXAScan    Standing Status:   Future    Standing Expiration Date:   01/11/2022    Scheduling Instructions:     Please call patient to schedule    Order Specific Question:   Reason for exam:    Answer:  Post menopausal    Order Specific Question:   Preferred imaging location?    Answer:   [facility]   (Contact our referral department at [phone] if you have not spoken with someone about your referral appointment within the next 5 days)    Follow-up Plan . Follow-up with Sunnie Nielsen, DO as planned . Please schedule your appointment for your shingles shot at your pharmacy. . Medicare wellness visit in one year.  . Foot exam and Urine  Micro-albumin can be done at your next in office visit. . Bone density scan referral has been sent and they will call you to schedule.    Health Maintenance, Female Adopting a healthy lifestyle and getting preventive care are important in promoting health and wellness. Ask your health care provider about:  The right schedule for you to have regular tests and exams.  Things you can do on your own to prevent diseases and keep yourself healthy. What should I know about diet, weight, and exercise? Eat a healthy diet  Eat a diet that includes plenty of vegetables, fruits, low-fat dairy products, and lean protein.  Do not eat a lot of foods that are high in solid fats, added sugars, or sodium.   Maintain a healthy weight Body mass index (BMI) is used to identify weight problems. It estimates body fat based on height and weight. Your health care provider can help determine your BMI and help you achieve or maintain a healthy weight. Get regular exercise Get regular exercise. This is one of the most important things you can do for your health. Most adults should:  Exercise for at least 150 minutes each week. The exercise should increase your heart rate and make you sweat (moderate-intensity exercise).  Do strengthening exercises at least twice a week. This is in addition to the moderate-intensity exercise.  Spend less time sitting. Even light physical activity can be beneficial. Watch cholesterol and blood lipids Have your blood tested for lipids and cholesterol at 76 years of age, then have this test every 5 years. Have your cholesterol levels checked more often if:  Your lipid or cholesterol levels are high.  You are older than 76 years of age.  You are at high risk for heart disease. What should I know about cancer screening? Depending on your health history and family history, you may need to have cancer screening at various ages. This may include screening for:  Breast  cancer.  Cervical cancer.  Colorectal cancer.  Skin cancer.  Lung cancer. What should I know about heart disease, diabetes, and high blood pressure? Blood pressure and heart disease  High blood pressure causes heart disease and increases the risk of stroke. This is more likely to develop in people who have high blood pressure readings, are of African descent, or are overweight.  Have your blood pressure checked: ? Every 3-5 years if you are 34-65 years of age. ? Every year if you are 15 years old or older. Diabetes Have regular diabetes screenings. This checks your fasting blood sugar level. Have the screening done:  Once every three years after age 3 if you are at a normal weight and have a low risk for diabetes.  More often and at a younger age if you are overweight or have a high risk for diabetes. What should I know about preventing infection? Hepatitis B If you have a higher risk for hepatitis B, you should be screened for this virus. Talk with your health care provider to  find out if you are at risk for hepatitis B infection. Hepatitis C Testing is recommended for:  Everyone born from [birth year] through 1966.  Anyone with known risk factors for hepatitis C. Sexually transmitted infections (STIs)  Get screened for STIs, including gonorrhea and chlamydia, if: ? You are sexually active and are younger than 76 years of age. ? You are older than 76 years of age and your health care provider tells you that you are at risk for this type of infection. ? Your sexual activity has changed since you were last screened, and you are at increased risk for chlamydia or gonorrhea. Ask your health care provider if you are at risk.  Ask your health care provider about whether you are at high risk for HIV. Your health care provider may recommend a prescription medicine to help prevent HIV infection. If you choose to take medicine to prevent HIV, you should first get tested for HIV. You should  then be tested every 3 months for as long as you are taking the medicine. Pregnancy  If you are about to stop having your period (premenopausal) and you may become pregnant, seek counseling before you get pregnant.  Take 400 to 800 micrograms (mcg) of folic acid every day if you become pregnant.  Ask for birth control (contraception) if you want to prevent pregnancy. Osteoporosis and menopause Osteoporosis is a disease in which the bones lose minerals and strength with aging. This can result in bone fractures. If you are 39 years old or older, or if you are at risk for osteoporosis and fractures, ask your health care provider if you should:  Be screened for bone loss.  Take a calcium or vitamin D supplement to lower your risk of fractures.  Be given hormone replacement therapy (HRT) to treat symptoms of menopause. Follow these instructions at home: Lifestyle  Do not use any products that contain nicotine or tobacco, such as cigarettes, e-cigarettes, and chewing tobacco. If you need help quitting, ask your health care provider.  Do not use street drugs.  Do not share needles.  Ask your health care provider for help if you need support or information about quitting drugs. Alcohol use  Do not drink alcohol if: ? Your health care provider tells you not to drink. ? You are pregnant, may be pregnant, or are planning to become pregnant.  If you drink alcohol: ? Limit how much you use to 0-1 drink a day. ? Limit intake if you are breastfeeding.  Be aware of how much alcohol is in your drink. In the [place] one drink equals one 12 oz bottle of beer (355 mL), one 5 oz glass of wine (148 mL), or one 1 oz glass of hard liquor (44 mL). General instructions  Schedule regular health, dental, and eye exams.  Stay current with your vaccines.  Tell your health care provider if: ? You often feel depressed. ? You have ever been abused or do not feel safe at home. Summary  Adopting a  healthy lifestyle and getting preventive care are important in promoting health and wellness.  Follow your health care provider's instructions about healthy diet, exercising, and getting tested or screened for diseases.  Follow your health care provider's instructions on monitoring your cholesterol and blood pressure. This information is not intended to replace advice given to you by your health care provider. Make sure you discuss any questions you have with your health care provider. Document Revised: 07/23/2018 Document Reviewed: 07/23/2018 Elsevier Patient Education  [place].

## 2021-01-11 NOTE — Progress Notes (Signed)
MEDICARE ANNUAL WELLNESS VISIT  01/11/2021  Subjective:  Britne Borelli is a 75 y.o. female patient of Sunnie Nielsen, DO who had a Medicare Annual Wellness Visit today. [employer] is Retired and lives with their family. she has 2 children. she reports that she is socially active and does interact with friends/family regularly. she is minimally physically active and enjoys playing games on her phone/tablet and watching tv.  Patient Care Team: Sunnie Nielsen, DO as PCP - General (Osteopathic Medicine)  Advanced Directives 01/11/2021 04/28/2019 12/14/2017 04/25/2017  Does Patient Have a Medical Advance Directive? Yes Yes Yes No  Type of Advance Directive Living will;Healthcare Power of [employer] Power of [place];Living will Living will -  Does patient want to make changes to medical advance directive? No - Patient declined No - Patient declined No - Patient declined -  Copy of Healthcare Power of Attorney in Chart? No - copy requested No - copy requested - -  Would patient like information on creating a medical advance directive? - - - (No Data)    Hospital Utilization Over the Past 12 Months: # of hospitalizations or ER visits: 0 # of surgeries: 0  Review of Systems    Patient reports that her overall health is worse when compared to last year.  Review of Systems: History obtained from chart review and the patient  All other systems negative.  Pain Assessment Pain : 0-10 Pain Score: 8  Pain Type: Chronic pain Pain Location: Back Pain Orientation: Lower Pain Descriptors / Indicators: Constant Pain Onset: More than a month ago Pain Frequency: Intermittent Pain Relieving Factors: Medication  Pain Relieving Factors: Medication  Current Medications & Allergies (verified) Allergies as of 01/11/2021   No Known Allergies     Medication List       Accurate as of January 11, 2021 11:42 AM. If you have any questions, ask your nurse or doctor.        Aimovig 70 MG/ML  Soaj Generic drug: Erenumab-aooe Inject 70 mg into the skin every 30 (thirty) days.   Biotin 96759 MCG Tabs Take 10,000 mcg by mouth daily.   cyclobenzaprine 10 MG tablet Commonly known as: FLEXERIL TAKE ONE TABLET BY MOUTH THREE TIMES DAILY   DULoxetine 30 MG capsule Commonly known as: CYMBALTA Take 3 capsules (90 mg total) by mouth daily.   fentaNYL 50 MCG/HR Commonly known as: DURAGESIC Place 1 patch onto the skin every 3 (three) days. Start taking on: January 15, 2021   fluticasone 50 MCG/ACT nasal spray Commonly known as: FLONASE Place 1 spray into both nostrils daily.   glucose blood test strip by Other route. Free Style Test Strips   metoprolol succinate 50 MG 24 hr tablet Commonly known as: TOPROL-XL Take 1 tablet (50 mg total) by mouth daily. TAKE WITH OR IMMEDIATELY FOLLOWING A MEAL.   montelukast 10 MG tablet Commonly known as: SINGULAIR Take 1 tablet (10 mg total) by mouth at bedtime.   PROBIOTIC ACIDOPHILUS PO Take by mouth.   RABEprazole 20 MG tablet Commonly known as: ACIPHEX Take 1 tablet (20 mg total) by mouth daily.   rizatriptan 5 MG tablet Commonly known as: Maxalt Take 1 tablet (5 mg total) by mouth as needed for migraine. May repeat in 2 hours if needed   [facility] DRY EYE RELIEF OP Apply 1 drop to eye 2 (two) times daily.   topiramate 50 MG tablet Commonly known as: TOPAMAX Take 3 tablets (150 mg total) by mouth at bedtime.   triamcinolone 0.1 %  paste Commonly known as: KENALOG   Vitamin D-3 25 MCG (1000 UT) Caps Take 2,000 Units by mouth daily.       History (reviewed): Past Medical History:  Diagnosis Date  . Arthritis   . Chronic kidney disease (CKD) 11/29/2015   next visit: get UA, Phos, Vit D, PTH, CMP   . Chronic pain syndrome 06/23/2015   Records reviewed: pt has been on Fentanyl TD, Cymbalta, Flexeril  . Degenerative disc disease, lumbar 06/23/2015   MRI 06/2014 most severe L4-5  . Depression   . Diabetes mellitus  without complication (HCC)   . Essential hypertension 06/02/2015  . GERD (gastroesophageal reflux disease) 06/02/2015  . Herniation of intervertebral disc between L4 and L5 06/23/2015   MRI 06/2014 moderate to severe R foraminal narrowing  . History of vitamin D deficiency 06/02/2015  . Hypertension   . Levoscoliosis 06/23/2015   Mild, MRI 06/22/14  . Migraine 06/02/2015  . Osteoporosis 06/02/2015  . Retrolisthesis of vertebrae 06/23/2015   Gr1 L1 on L2 MRI 06/2014  . Seasonal allergies 06/02/2015  . Type 2 diabetes mellitus (HCC) 06/02/2015  . Type 2 diabetes with nephropathy (HCC) 11/29/2015   DIABETES SCREENING/PREVENTIVE CARE: Updated 03/05/16  A1C past 3-6 mos: Yes, 03/05/16 6.6% controlled? Yes BP goal <140/90: Yes  LDL goal <70: no - 100 11/24/15 Eye exam annually: Yes , importance discussed with patient Foot exam:  08/31/15 Microalbuminuria:   not applicable, patient is on ACE/ARB Metformin: No - declines ACE/ARB: Yes  Antiplatelet if ASCVD Risk >10%: Recommended Statin: No    Past Surgical History:  Procedure Laterality Date  . BUNIONECTOMY Left 1993  . eye surgery    . FOOT SURGERY  2008  . FOOT SURGERY  2003  . NECK SURGERY  1993  . REPLACEMENT TOTAL KNEE Bilateral 1999   R in Jan, L in Apr  . TONSILLECTOMY  1952   Family History  Problem Relation Age of Onset  . Diabetes Sister   . Heart Problems Sister        multiple bypass surgeries  . Cancer Sister   . Depression Brother   . Heart Problems Brother        bad heart use oxygen  . Coronary artery disease Mother   . Migraines Mother   . Pulmonary fibrosis Father   . Pneumonia Father   . Migraines Father   . Breast cancer Maternal Aunt    Social History   Socioeconomic History  . Marital status: Widowed    Spouse name: Not on file  . Number of children: 2  . Years of education: 1  . Highest education level: GED or equivalent  Occupational History  . Occupation: office work    Comment: retired  Tobacco  Use  . Smoking status: Never Smoker  . Smokeless tobacco: Never Used  Vaping Use  . Vaping Use: Never used  Substance and Sexual Activity  . Alcohol use: No    Alcohol/week: 0.0 standard drinks  . Drug use: No  . Sexual activity: Not Currently  Other Topics Concern  . Not on file  Social History Narrative   Lives at home w/ her son and his wife   Right-handed   Enjoys playing games on her phones and tablet.   Social Determinants of Health   Financial Resource Strain: Low Risk   . Difficulty of Paying Living Expenses: Not hard at all  Food Insecurity: No Food Insecurity  . Worried About Programme researcher, broadcasting/film/video in  the Last Year: Never true  . Ran Out of Food in the Last Year: Never true  Transportation Needs: No Transportation Needs  . Lack of Transportation (Medical): No  . Lack of Transportation (Non-Medical): No  Physical Activity: Inactive  . Days of Exercise per Week: 0 days  . Minutes of Exercise per Session: 0 min  Stress: No Stress Concern Present  . Feeling of Stress : Not at all  Social Connections: Socially Isolated  . Frequency of Communication with Friends and Family: More than three times a week  . Frequency of Social Gatherings with Friends and Family: More than three times a week  . Attends Religious Services: Never  . Active Member of Clubs or Organizations: No  . Attends Banker Meetings: Never  . Marital Status: Widowed    Activities of Daily Living In your present state of health, do you have any difficulty performing the following activities: 01/11/2021  Hearing? N  Vision? N  Difficulty concentrating or making decisions? Y  Comment has had some trouble with memory  Walking or climbing stairs? Y  Comment does not climb stairs.  Dressing or bathing? Y  Comment is not able to bathe but can shower on her own.  Doing errands, shopping? Y  Comment she doesn't drive. her son takes her to her appts.  Preparing Food and eating ? N  Using the  Toilet? N  In the past six months, have you accidently leaked urine? Y  Comment she has had a few accidents.  Do you have problems with loss of bowel control? Y  Comment she had a few accidents.  Managing your Medications? N  Managing your Finances? Y  Comment her son helps with that  Housekeeping or managing your Housekeeping? Y  Comment her son helps with that  Some recent data might be hidden    Patient Education/Literacy How often do you need to have someone help you when you read instructions, pamphlets, or other written materials from your doctor or pharmacy?: 1 - Never What is the last grade level you completed in school?: 12th grade  Exercise Current Exercise Habits: The patient does not participate in regular exercise at present, Exercise limited by: orthopedic condition(s)  Diet Patient reports consuming 1-2 meals a day and 0 snack(s) a day Patient reports that her primary diet is: Regular Patient reports that she does have regular access to food.   Depression Screen PHQ 2/9 Scores 01/11/2021 04/28/2019 07/16/2018 04/03/2018 01/03/2017 11/29/2015  PHQ - 2 Score 2 0 - 0 0 0  PHQ- 9 Score 3 - - 2 0 -  Exception Documentation - - Patient refusal - - -     Fall Risk Fall Risk  01/11/2021 09/06/2020 04/28/2019 11/29/2015  Falls in the past year? 1 1 1  No  Number falls in past yr: 1 1 0 -  Injury with Fall? 1 1 0 -  Risk for fall due to : History of fall(s) - Impaired balance/gait;Impaired mobility -  Follow up Falls evaluation completed;Education provided;Falls prevention discussed - Falls prevention discussed -     Objective:   BP 128/71 (BP Location: Left Arm, Patient Position: Sitting, Cuff Size: Normal)   Pulse 95   Ht 5\' 1"  (1.549 m)   Wt 194 lb (88 kg)   SpO2 97%   BMI 36.66 kg/m   Last Weight  Most recent update: 01/11/2021 11:03 AM   Weight  88 kg (194 lb)  Body mass index is 36.66 kg/m.  Hearing/Vision  . Elayjah did not have difficulty with  hearing/understanding during the face-to-face interview . Thresea did not have difficulty with her vision during the face-to-face interview . Reports that she has not had a formal eye exam by an eye care professional within the past year . Reports that she has not had a formal hearing evaluation within the past year  Cognitive Function: 6CIT Screen 01/11/2021 04/28/2019  What Year? 0 points 0 points  What month? 0 points 0 points  What time? 0 points 0 points  Count back from 20 0 points 0 points  Months in reverse 0 points 0 points  Repeat phrase 0 points 0 points  Total Score 0 0    Normal Cognitive Function Screening: Yes (Normal:0-7, Significant for Dysfunction: >8)  Immunization & Health Maintenance Record Immunization History  Administered Date(s) Administered  . Fluad Quad(high Dose 65+) 05/19/2019  . Influenza, High Dose Seasonal PF 07/16/2018  . Influenza,inj,Quad PF,6+ Mos 09/05/2016  . Influenza-Unspecified 04/28/2015  . PPD Test 12/16/2017  . Pneumococcal Conjugate-13 06/04/2014  . Pneumococcal Polysaccharide-23 09/20/2010  . Tdap 11/29/2015    Health Maintenance  Topic Date Due  . OPHTHALMOLOGY EXAM  01/11/2021 (Originally 10/21/2020)  . COVID-19 Vaccine (1) 01/20/2021 (Originally 09/03/1950)  . URINE MICROALBUMIN  02/10/2021 (Originally 06/06/2015)  . Zoster Vaccines- Shingrix (1 of 2) 04/13/2021 (Originally 09/03/1994)  . FOOT EXAM  01/11/2022 (Originally 08/30/2016)  . HEMOGLOBIN A1C  03/06/2021  . INFLUENZA VACCINE  03/13/2021  . TETANUS/TDAP  11/28/2025  . DEXA SCAN  Completed  . PNA vac Low Risk Adult  Completed  . Hepatitis C Screening  Addressed  . HPV VACCINES  Aged Out       Assessment  This is a routine wellness examination for [name].  Health Maintenance: Due or Overdue There are no preventive care reminders to display for this patient.  [name] does not need a referral for Community Assistance: Care  Management:   no Social Work:    no Prescription Assistance:  no Nutrition/Diabetes Education:  no   Plan:  Personalized Goals Goals Addressed              This Visit's Progress   .  Patient Stated (pt-stated)        01/11/2021 AWV Goal: Exercise for General Health   Patient will verbalize understanding of the benefits of increased physical activity:  Exercising regularly is important. It will improve your overall fitness, flexibility, and endurance.  Regular exercise also will improve your overall health. It can help you control your weight, reduce stress, and improve your bone density.  Over the next year, patient will increase physical activity as tolerated with a goal of at least 150 minutes of moderate physical activity per week.   You can tell that you are exercising at a moderate intensity if your heart starts beating faster and you start breathing faster but can still hold a conversation.  Moderate-intensity exercise ideas include:  Walking 1 mile (1.6 km) in about 15 minutes  Biking  Hiking  Golfing  Dancing  Water aerobics  Patient will verbalize understanding of everyday activities that increase physical activity by providing examples like the following: ? Yard work, such as: ? Pushing a Surveyor, mining ? Raking and bagging leaves ? Washing your car ? Pushing a stroller ? Shoveling snow ? Gardening ? Washing windows or floors  Patient will be able to explain general safety guidelines for exercising:  Before you start a new exercise program, talk with your health care provider.  Do not exercise so much that you hurt yourself, feel dizzy, or get very short of breath.  Wear comfortable clothes and wear shoes with good support.  Drink plenty of water while you exercise to prevent dehydration or heat stroke.  Work out until your breathing and your heartbeat get faster.       Personalized Health Maintenance & Screening Recommendations  Bone  densitometry screening Covid vaccine, shingles vaccine  Foot exam and Urine Microalbumin  Patient declined covid vaccine.  Lung Cancer Screening Recommended: no (Low Dose CT Chest recommended if Age 60-80 years, 30 pack-year currently smoking OR have quit w/in past 15 years) Hepatitis C Screening recommended: no HIV Screening recommended: no  Advanced Directives: Written information was not given per the patient's request.  Referrals & Orders Orders Placed This Encounter  Procedures  . DEXAScan    Follow-up Plan . Follow-up with Sunnie NielsenAlexander, Natalie, DO as planned . Please schedule your appointment for your shingles shot at your pharmacy. . Foot exam and Urine Micro-albumin can be done at your next in office visit. . Medicare wellness visit in one year.  . Bone density scan referral has been sent and they will call you to schedule.    I have personally reviewed and noted the following in the patient's chart:   . Medical and social history . Use of alcohol, tobacco or illicit drugs  . Current medications and supplements . Functional ability and status . Nutritional status . Physical activity . Advanced directives . List of other physicians . Hospitalizations, surgeries, and ER visits in previous 12 months . Vitals . Screenings to include cognitive, depression, and falls . Referrals and appointments  In addition, I have reviewed and discussed with patient certain preventive protocols, quality metrics, and best practice recommendations. A written personalized care plan for preventive services as well as general preventive health recommendations were provided to patient.     [place] CharonBableen  Sydne Krahl, RN  01/11/2021

## 2021-01-21 ENCOUNTER — Other Ambulatory Visit: Payer: Self-pay | Admitting: Osteopathic Medicine

## 2021-01-24 ENCOUNTER — Encounter: Payer: Self-pay | Admitting: Osteopathic Medicine

## 2021-01-25 MED ORDER — ASPIRIN 81 MG PO TBEC: 81.0000 mg | DELAYED_RELEASE_TABLET | Freq: Every day | ORAL | 3 refills | Status: DC

## 2021-02-08 ENCOUNTER — Other Ambulatory Visit: Payer: Medicare Other

## 2021-02-14 ENCOUNTER — Other Ambulatory Visit: Payer: Self-pay | Admitting: Osteopathic Medicine

## 2021-02-14 DIAGNOSIS — M5136 Other intervertebral disc degeneration, lumbar region: Secondary | ICD-10-CM

## 2021-02-14 DIAGNOSIS — M51369 Other intervertebral disc degeneration, lumbar region without mention of lumbar back pain or lower extremity pain: Secondary | ICD-10-CM

## 2021-02-14 DIAGNOSIS — M5126 Other intervertebral disc displacement, lumbar region: Secondary | ICD-10-CM

## 2021-02-15 ENCOUNTER — Other Ambulatory Visit: Payer: Medicare Other

## 2021-02-22 ENCOUNTER — Telehealth: Payer: Self-pay | Admitting: Osteopathic Medicine

## 2021-02-22 NOTE — Chronic Care Management (AMB) (Signed)
  Chronic Care Management   Outreach Note  02/22/2021 Name: Teresa Medina MRN: [id number] DOB: 1946/02/09  Referred by: Sunnie Nielsen, DO Reason for referral : No chief complaint on file.   An unsuccessful telephone outreach was attempted today. The patient was referred to the pharmacist for assistance with care management and care coordination.   Follow Up Plan:   Carmell [place] Upstream Scheduler

## 2021-03-06 ENCOUNTER — Telehealth: Payer: Self-pay | Admitting: Osteopathic Medicine

## 2021-03-06 NOTE — Chronic Care Management (AMB) (Signed)
  Chronic Care Management   Outreach Note  03/06/2021 Name: Teresa Medina MRN: [id number] DOB: [birth year]  Referred by: Sunnie Nielsen, DO Reason for referral : No chief complaint on file.   A second unsuccessful telephone outreach was attempted today. The patient was referred to pharmacist for assistance with care management and care coordination.  Follow Up Plan:   Carmell [place] Upstream Scheduler

## 2021-03-13 ENCOUNTER — Telehealth: Payer: Self-pay | Admitting: Osteopathic Medicine

## 2021-03-13 NOTE — Telephone Encounter (Signed)
Pt called.  She misplaced some of her meds she takes for her headache. She wants to know if she can get the difference refilled.  Thanks.

## 2021-03-17 ENCOUNTER — Other Ambulatory Visit: Payer: Self-pay | Admitting: Osteopathic Medicine

## 2021-03-17 DIAGNOSIS — M5136 Other intervertebral disc degeneration, lumbar region: Secondary | ICD-10-CM

## 2021-03-17 DIAGNOSIS — M5126 Other intervertebral disc displacement, lumbar region: Secondary | ICD-10-CM

## 2021-03-20 ENCOUNTER — Other Ambulatory Visit: Payer: Self-pay | Admitting: Osteopathic Medicine

## 2021-03-20 ENCOUNTER — Telehealth: Payer: Self-pay

## 2021-03-20 ENCOUNTER — Encounter: Payer: Self-pay | Admitting: Osteopathic Medicine

## 2021-03-20 ENCOUNTER — Other Ambulatory Visit: Payer: Self-pay

## 2021-03-20 DIAGNOSIS — M5126 Other intervertebral disc displacement, lumbar region: Secondary | ICD-10-CM

## 2021-03-20 DIAGNOSIS — M5136 Other intervertebral disc degeneration, lumbar region: Secondary | ICD-10-CM

## 2021-03-20 MED ORDER — AIMOVIG 70 MG/ML ~~LOC~~ SOAJ: 70.0000 mg | SUBCUTANEOUS | 3 refills | Status: DC

## 2021-03-20 MED ORDER — FENTANYL 50 MCG/HR TD PT72: 1.0000 | MEDICATED_PATCH | TRANSDERMAL | 0 refills | Status: DC

## 2021-03-20 NOTE — Telephone Encounter (Signed)
Sent!

## 2021-03-20 NOTE — Telephone Encounter (Signed)
Pt is requesting med refill for fentanyl. Rx not listed in active med list. Pls review Mychart msg sent today.

## 2021-04-03 ENCOUNTER — Ambulatory Visit (INDEPENDENT_AMBULATORY_CARE_PROVIDER_SITE_OTHER): Payer: Medicare Other | Admitting: Osteopathic Medicine

## 2021-04-03 ENCOUNTER — Other Ambulatory Visit: Payer: Self-pay

## 2021-04-03 ENCOUNTER — Encounter: Payer: Self-pay | Admitting: Osteopathic Medicine

## 2021-04-03 VITALS — BP 124/68 | HR 86 | Wt 191.0 lb

## 2021-04-03 DIAGNOSIS — M810 Age-related osteoporosis without current pathological fracture: Secondary | ICD-10-CM

## 2021-04-03 DIAGNOSIS — E1121 Type 2 diabetes mellitus with diabetic nephropathy: Secondary | ICD-10-CM

## 2021-04-03 DIAGNOSIS — G43801 Other migraine, not intractable, with status migrainosus: Secondary | ICD-10-CM | POA: Diagnosis not present

## 2021-04-03 DIAGNOSIS — G894 Chronic pain syndrome: Secondary | ICD-10-CM

## 2021-04-03 DIAGNOSIS — M4180 Other forms of scoliosis, site unspecified: Secondary | ICD-10-CM

## 2021-04-03 DIAGNOSIS — M4310 Spondylolisthesis, site unspecified: Secondary | ICD-10-CM | POA: Diagnosis not present

## 2021-04-03 DIAGNOSIS — N183 Chronic kidney disease, stage 3 unspecified: Secondary | ICD-10-CM | POA: Diagnosis not present

## 2021-04-03 DIAGNOSIS — M5136 Other intervertebral disc degeneration, lumbar region: Secondary | ICD-10-CM

## 2021-04-03 DIAGNOSIS — I10 Essential (primary) hypertension: Secondary | ICD-10-CM

## 2021-04-03 DIAGNOSIS — K219 Gastro-esophageal reflux disease without esophagitis: Secondary | ICD-10-CM

## 2021-04-03 DIAGNOSIS — M5126 Other intervertebral disc displacement, lumbar region: Secondary | ICD-10-CM

## 2021-04-03 LAB — POCT GLYCOSYLATED HEMOGLOBIN (HGB A1C): Hemoglobin A1C: 7.4 % — AB (ref 4.0–5.6)

## 2021-04-03 MED ORDER — FENTANYL 50 MCG/HR TD PT72: 1.0000 | MEDICATED_PATCH | TRANSDERMAL | 0 refills | Status: DC

## 2021-04-03 MED ORDER — FENTANYL 50 MCG/HR TD PT72: 1.0000 | MEDICATED_PATCH | TRANSDERMAL | 0 refills | Status: AC

## 2021-04-03 MED ORDER — RIZATRIPTAN BENZOATE 5 MG PO TABS: 5.0000 mg | ORAL_TABLET | ORAL | 0 refills | Status: DC | PRN

## 2021-04-03 NOTE — Progress Notes (Signed)
Teresa Medina is a 75 y.o. female who presents to  [facility] at [facility]  today, 04/03/21, seeking care for the following:  Routine check chronic conditions - see below  Maintain opiate Rx - due to renew controlled substance agreement Indication for chronic opioid: lumbar DDD and chronic pain syndrome  Medication and dose: Fentanyl patches 50 mcg/hour # patches per month: 10 Opioid Treatment Agreement signed (Y/N): updated 04/03/21  Opioid Treatment Agreement last reviewed with patient:  updated 04/03/21  N[facility] reviewed this encounter (include red flags):  no concerns  DM2 - last A1C 6+ mos ago was 6.2, we stopped glyburide d/t hypoglycemia, today is 7.4, pt states FBG 90s-160s, she is not careful about her diet stating she wis not going to adhere to strict diet and is ok to get back on Rx if needed          ASSESSMENT & PLAN with other pertinent findings:  The primary encounter diagnosis was Type 2 diabetes with nephropathy (HCC). Diagnoses of Stage 3 chronic kidney disease, unspecified whether stage 3a or 3b CKD (HCC), Degenerative disc disease, lumbar, Herniation of intervertebral disc between L4 and L5, Chronic pain syndrome, Levoscoliosis, Retrolisthesis of vertebrae, Essential hypertension, Osteoporosis without current pathological fracture, unspecified osteoporosis type, Gastroesophageal reflux disease, unspecified whether esophagitis present, and Other migraine with status migrainosus, not intractable were also pertinent to this visit.   1. Type 2 diabetes with nephropathy (HCC) A1C ok for age and comorbids, monitor q3 mos, pt ok to get back on Rx if needed  2. Stage 3 chronic kidney disease, unspecified whether stage 3a or 3b CKD (HCC) Has been stable, would repeat next labs  3. Degenerative disc disease, lumbar 4. Herniation of intervertebral disc between L4 and L5 5. Chronic pain syndrome 6. Levoscoliosis 7.  Retrolisthesis of vertebrae Opiate contract reviewed and updated today   8. Essential hypertension BP Readings from Last 3 Encounters:  04/03/21 124/68  01/11/21 128/71  09/06/20 128/84   9. Osteoporosis without current pathological fracture, unspecified osteoporosis type Continue vitamin D and calcium   10. Gastroesophageal reflux disease, unspecified whether esophagitis present Stable on current meds  11. Other migraine with status migrainosus, not intractable Requests pills in bottle not in pack... wil ask pharmacy if this can be arranged   There are no Patient Instructions on file for this visit.  Orders Placed This Encounter  Procedures   POCT HgB A1C   Results for orders placed or performed in visit on 04/03/21 (from the past 24 hour(s))  POCT HgB A1C     Status: Abnormal   Collection Time: 04/03/21  1:53 PM  Result Value Ref Range   Hemoglobin A1C 7.4 (A) 4.0 - 5.6 %   HbA1c POC (<> result, manual entry)     HbA1c, POC (prediabetic range)     HbA1c, POC (controlled diabetic range)       Meds ordered this encounter  Medications   fentaNYL (DURAGESIC) 50 MCG/HR    Sig: Place 1 patch onto the skin every 3 (three) days.    Dispense:  10 patch    Refill:  0   fentaNYL (DURAGESIC) 50 MCG/HR    Sig: Place 1 patch onto the skin every 3 (three) days.    Dispense:  10 patch    Refill:  0   fentaNYL (DURAGESIC) 50 MCG/HR    Sig: Place 1 patch onto the skin every 3 (three) days.    Dispense:  10  patch    Refill:  0   rizatriptan (MAXALT) 5 MG tablet    Sig: Take 1 tablet (5 mg total) by mouth as needed for migraine. May repeat in 2 hours if needed    Dispense:  10 tablet    Refill:  0    Patient requests these be in a BOTTLE rather than a PILL PACK since she has trouble reading and opening the packs     See below for relevant physical exam findings  See below for recent lab and imaging results reviewed  Medications, allergies, PMH, PSH, SocH, FamH reviewed  below    Follow-up instructions: Return in about 3 months (around 07/04/2021) for FOLLOW UP W/ JOY FOR DIABETES AND PAIN MEDICATION .                                        Exam:  BP 124/68 (BP Location: Left Arm, Patient Position: Sitting, Cuff Size: Large)   Pulse 86   Wt 191 lb 0.6 oz (86.7 kg)   BMI 36.10 kg/m  Constitutional: VS see above. General Appearance: alert, well-developed, well-nourished, NAD Neck: No masses, trachea midline.  Respiratory: Normal respiratory effort. no wheeze, no rhonchi, no rales Cardiovascular: S1/S2 normal, no murmur, no rub/gallop auscultated. RRR.  Musculoskeletal: Gait normal. Walks with cane. Severe thoracic kyphosis  Neurological: Normal balance/coordination. No tremor. Skin: warm, dry, intact.  Psychiatric: Normal judgment/insight. Normal mood and affect. Oriented x3.   Current Meds  Medication Sig   aspirin 81 MG EC tablet Take 1 tablet (81 mg total) by mouth daily. Swallow whole.   Biotin 61443 MCG TABS Take 10,000 mcg by mouth daily.   Cholecalciferol (VITAMIN D-3) 1000 UNITS CAPS Take 2,000 Units by mouth daily.    cyclobenzaprine (FLEXERIL) 10 MG tablet TAKE ONE TABLET BY MOUTH THREE TIMES DAILY   DULoxetine (CYMBALTA) 30 MG capsule Take 3 capsules (90 mg total) by mouth daily.   Erenumab-aooe (AIMOVIG) 70 MG/ML SOAJ Inject 70 mg into the skin every 30 (thirty) days.   [START ON 05/03/2021] fentaNYL (DURAGESIC) 50 MCG/HR Place 1 patch onto the skin every 3 (three) days.   fentaNYL (DURAGESIC) 50 MCG/HR Place 1 patch onto the skin every 3 (three) days.   fluticasone (FLONASE) 50 MCG/ACT nasal spray Place 1 spray into both nostrils daily.   glucose blood test strip by Other route. Free Style Test Strips   Homeopathic Products (SIMILASAN DRY EYE RELIEF OP) Apply 1 drop to eye 2 (two) times daily.   metoprolol succinate (TOPROL-XL) 50 MG 24 hr tablet Take 1 tablet (50 mg total) by mouth daily. TAKE WITH OR  IMMEDIATELY FOLLOWING A MEAL.   montelukast (SINGULAIR) 10 MG tablet Take 1 tablet (10 mg total) by mouth at bedtime.   RABEprazole (ACIPHEX) 20 MG tablet Take 1 tablet (20 mg total) by mouth daily.   topiramate (TOPAMAX) 50 MG tablet Take 3 tablets (150 mg total) by mouth at bedtime.   triamcinolone (KENALOG) 0.1 % paste    [DISCONTINUED] fentaNYL (DURAGESIC) 50 MCG/HR Place 1 patch onto the skin every 3 (three) days.   [DISCONTINUED] rizatriptan (MAXALT) 5 MG tablet Take 1 tablet (5 mg total) by mouth as needed for migraine. May repeat in 2 hours if needed    No Known Allergies  Patient Active Problem List   Diagnosis Date Noted   Closed fracture of distal end of humerus  12/19/2017   Type 2 diabetes with nephropathy (HCC) 11/29/2015   Chronic kidney disease (CKD) 11/29/2015   Hx of bone density study 11/29/2015   Postmenopausal 08/31/2015   Chronic pain syndrome 06/23/2015   Levoscoliosis 06/23/2015   Retrolisthesis of vertebrae 06/23/2015   Degenerative disc disease, lumbar 06/23/2015   Herniation of intervertebral disc between L4 and L5 06/23/2015   Type 2 diabetes mellitus (HCC) 06/02/2015   Essential hypertension 06/02/2015   Migraine 06/02/2015   Seasonal allergies 06/02/2015   History of vitamin D deficiency 06/02/2015   Osteoporosis 06/02/2015   GERD (gastroesophageal reflux disease) 06/02/2015    Family History  Problem Relation Age of Onset   Diabetes Sister    Heart Problems Sister        multiple bypass surgeries   Cancer Sister    Depression Brother    Heart Problems Brother        bad heart use oxygen   Coronary artery disease Mother    Migraines Mother    Pulmonary fibrosis Father    Pneumonia Father    Migraines Father    Breast cancer Maternal Aunt     Social History   Tobacco Use  Smoking Status Never  Smokeless Tobacco Never    Past Surgical History:  Procedure Laterality Date   BUNIONECTOMY Left 1993   eye surgery     FOOT SURGERY  2008    FOOT SURGERY  2003   NECK SURGERY  1993   REPLACEMENT TOTAL KNEE Bilateral 1999   R in Jan, L in Apr   TONSILLECTOMY  1952    Immunization History  Administered Date(s) Administered   Fluad Quad(high Dose 65+) 05/19/2019   Influenza, High Dose Seasonal PF 07/16/2018   Influenza,inj,Quad PF,6+ Mos 09/05/2016   Influenza-Unspecified 04/28/2015   PPD Test 12/16/2017   Pneumococcal Conjugate-13 06/04/2014   Pneumococcal Polysaccharide-23 09/20/2010   Tdap 11/29/2015    Recent Results (from the past 2160 hour(s))  POCT HgB A1C     Status: Abnormal   Collection Time: 04/03/21  1:53 PM  Result Value Ref Range   Hemoglobin A1C 7.4 (A) 4.0 - 5.6 %   HbA1c POC (<> result, manual entry)     HbA1c, POC (prediabetic range)     HbA1c, POC (controlled diabetic range)      No results found.     All questions at time of visit were answered - patient instructed to contact office with any additional concerns or updates. ER/RTC precautions were reviewed with the patient as applicable.   Please note: manual typing as well as voice recognition software may have been used to produce this document - typos may escape review. Please contact Dr. Lyn Hollingshead for any needed clarifications.

## 2021-04-04 ENCOUNTER — Other Ambulatory Visit: Payer: Self-pay | Admitting: Osteopathic Medicine

## 2021-04-05 ENCOUNTER — Telehealth: Payer: Self-pay | Admitting: Lab

## 2021-04-05 NOTE — Chronic Care Management (AMB) (Signed)
  Chronic Care Management   Note  04/05/2021 Name: Jaquitta Dupriest MRN: [id number] DOB: April 22, 1946  Sallyann Kinnaird is a 75 y.o. year old female who is a primary care patient of Sunnie Nielsen, DO. I reached out to [name] by phone today in response to a referral sent by Ms. Venida Jarvis PCP, Sunnie Nielsen, DO.   Ms. Rudden was given information about Chronic Care Management services today including:  CCM service includes personalized support from designated clinical staff supervised by her physician, including individualized plan of care and coordination with other care providers 24/7 contact phone numbers for assistance for urgent and routine care needs. Service will only be billed when office clinical staff spend 20 minutes or more in a month to coordinate care. Only one practitioner may furnish and bill the service in a calendar month. The patient may stop CCM services at any time (effective at the end of the month) by phone call to the office staff.   Patient agreed to services and verbal consent obtained.   Follow up plan:   Carilyn Goodpasture  Upstream Scheduler

## 2021-04-13 ENCOUNTER — Other Ambulatory Visit: Payer: Self-pay | Admitting: Osteopathic Medicine

## 2021-04-14 ENCOUNTER — Telehealth: Payer: Self-pay

## 2021-04-14 MED ORDER — RIZATRIPTAN BENZOATE 5 MG PO TABS: 5.0000 mg | ORAL_TABLET | ORAL | 0 refills | Status: DC | PRN

## 2021-04-14 NOTE — Telephone Encounter (Signed)
Christine from [employer] called requesting a clarification on rizatriptan rx. Per pharm tech, what is the maximum daily dose for patient. Please send an updated rx to the pharmacy. Thanks in advance.

## 2021-04-20 ENCOUNTER — Telehealth: Payer: Self-pay

## 2021-04-20 NOTE — Telephone Encounter (Signed)
[facility] pharmacy called requesting an update on prior authorization for fentanyl patches. Per pharmacist, patient has been out of patches for two days. Requesting for coordinator to expedite prior authorization. Pharmacy will fax over required information.

## 2021-05-01 NOTE — Telephone Encounter (Signed)
Medication: fentaNYL (DURAGESIC) 50 MCG/HR Prior authorization determination received Medication has been approved Approval dates: 04/01/2021-10/28/2021  Patient aware via: MyChart Pharmacy aware: Yes Provider aware via this encounter

## 2021-05-15 ENCOUNTER — Telehealth: Payer: Self-pay | Admitting: Pharmacist

## 2021-05-15 NOTE — Chronic Care Management (AMB) (Signed)
    Chronic Care Management Pharmacy Assistant   Name: Pauline Pegues  MRN: [id number] DOB: October 08, 1945  Teresa Medina is an 75 y.o. year old female who presents for herinitial CCM visit with the clinical pharmacist.  Recent office visits:  04/03/21 Sunnie Nielsen DO PCP- pt was seen for Type 2 Diabetes w/ neuropathy. Labs were ordered and pt started on Fentanyl 50 mcg/hr pt72. Follow up in 3 months.  01/11/21 Sunnie Nielsen DO PCP- pt was seen for post menopause. Dexascan was done and pt was advised to f/u as planned.  01/04/21 Sunnie Nielsen DO PCP VIDEO- pt was seen for chronic migraine w/o aura. Provider disc glyburide 1.25 mg. No f/u noted.  Recent consult visits:  None noted  Hospital visits:  None in previous 6 months  Medications: Outpatient Encounter Medications as of 05/15/2021  Medication Sig   aspirin 81 MG EC tablet Take 1 tablet (81 mg total) by mouth daily. Swallow whole.   Biotin 00938 MCG TABS Take 10,000 mcg by mouth daily.   Cholecalciferol (VITAMIN D-3) 1000 UNITS CAPS Take 2,000 Units by mouth daily.    cyclobenzaprine (FLEXERIL) 10 MG tablet TAKE ONE TABLET BY MOUTH THREE TIMES DAILY   DULoxetine (CYMBALTA) 30 MG capsule Take 3 capsules (90 mg total) by mouth daily.   Erenumab-aooe (AIMOVIG) 70 MG/ML SOAJ Inject 70 mg into the skin every 30 (thirty) days.   fentaNYL (DURAGESIC) 50 MCG/HR Place 1 patch onto the skin every 3 (three) days.   [START ON 06/02/2021] fentaNYL (DURAGESIC) 50 MCG/HR Place 1 patch onto the skin every 3 (three) days.   fluticasone (FLONASE) 50 MCG/ACT nasal spray Place 1 spray into both nostrils daily.   glucose blood test strip by Other route. Free Style Test Strips   Homeopathic Products (SIMILASAN DRY EYE RELIEF OP) Apply 1 drop to eye 2 (two) times daily.   metoprolol succinate (TOPROL-XL) 50 MG 24 hr tablet Take 1 tablet (50 mg total) by mouth daily. TAKE WITH OR IMMEDIATELY FOLLOWING A MEAL.   montelukast (SINGULAIR) 10 MG  tablet Take 1 tablet (10 mg total) by mouth at bedtime.   RABEprazole (ACIPHEX) 20 MG tablet Take 1 tablet (20 mg total) by mouth daily.   rizatriptan (MAXALT) 5 MG tablet Take 1 tablet (5 mg total) by mouth as needed for migraine. May repeat in 2 hours if needed.  Maximum daily dose 15 mg   topiramate (TOPAMAX) 50 MG tablet Take 3 tablets (150 mg total) by mouth at bedtime.   triamcinolone (KENALOG) 0.1 % paste    No facility-administered encounter medications on file as of 05/15/2021.    Current Medication List  aspirin 81 MG EC tablet last filled 04/16/21 30 DS Biotin 18299 MCG TABS Cholecalciferol (VITAMIN D-3) 1000 UNITS CAPS cyclobenzaprine (FLEXERIL) 10 MG tablet last filled 04/14/21 30 DS DULoxetine (CYMBALTA) 30 MG capsule last filled 04/16/21 30 DS Erenumab-aooe (AIMOVIG) 70 MG/ML SOAJ last filled 04/13/21 30 DS fentaNYL (DURAGESIC) 50 MCG/HR last filled 04/20/21 30 DS fluticasone (FLONASE) 50 MCG/ACT nasal spray Homeopathic Products  metoprolol succinate 50 MG 24 hr tablet last filled 04/16/21 30 DS montelukast (SINGULAIR) 10 MG tablet last filled 04/16/21 30 DS RABEprazole (ACIPHEX) 20 MG tablet last filled 04/16/21 30 DS rizatriptan (MAXALT) 5 MG tablet last filled 04/04/21 30 DS topiramate (TOPAMAX) 50 MG tablet last filled 04/16/21 30 DS triamcinolone (KENALOG) 0.1 % paste last filled 04/13/21 14 DS  Gasper Sells CMA Clinical Pharmacist Assistant [phone]

## 2021-05-16 ENCOUNTER — Other Ambulatory Visit: Payer: Self-pay

## 2021-05-16 ENCOUNTER — Ambulatory Visit (INDEPENDENT_AMBULATORY_CARE_PROVIDER_SITE_OTHER): Payer: Medicare Other | Admitting: Pharmacist

## 2021-05-16 DIAGNOSIS — E1121 Type 2 diabetes mellitus with diabetic nephropathy: Secondary | ICD-10-CM

## 2021-05-16 DIAGNOSIS — I10 Essential (primary) hypertension: Secondary | ICD-10-CM

## 2021-05-16 DIAGNOSIS — G894 Chronic pain syndrome: Secondary | ICD-10-CM

## 2021-05-16 NOTE — Progress Notes (Signed)
Chronic Care Management Pharmacy Note  05/16/2021 Name:  Teresa Medina MRN:  [id number] DOB:  [birth year]  Summary: addressed DM, HTN, chronic pain  Recommendations/Changes made from today's visit: none  Plan: f/u with pharmacist in 6 months  Subjective: Teresa Medina is an 75 y.o. year old female who is a primary patient of Emeterio Reeve, DO.  The CCM team was consulted for assistance with disease management and care coordination needs.    Engaged with patient by telephone for initial visit in response to provider referral for pharmacy case management and/or care coordination services.   Consent to Services:  The patient was given information about Chronic Care Management services, agreed to services, and gave verbal consent prior to initiation of services.  Please see initial visit note for detailed documentation.   Patient Care Team: Emeterio Reeve, DO as PCP - General (Osteopathic Medicine) Darius Bump, [facility] as Pharmacist (Pharmacist)  Recent office visits:  04/03/21 Emeterio Reeve DO PCP- pt was seen for Type 2 Diabetes w/ neuropathy. Labs were ordered and pt started on Fentanyl 50 mcg/hr pt72. Follow up in 3 months.   01/11/21 Emeterio Reeve DO PCP- pt was seen for post menopause. Dexascan was done and pt was advised to f/u as planned.   01/04/21 Emeterio Reeve DO PCP VIDEO- pt was seen for chronic migraine w/o aura. Provider disc glyburide 1.25 mg. No f/u noted.   Recent consult visits:  None noted   Hospital visits:  None in previous 6 months  Objective:  Lab Results  Component Value Date   CREATININE 1.13 (H) 09/06/2020   CREATININE 1.10 (H) 11/18/2019   CREATININE 1.33 (H) 04/17/2018    Lab Results  Component Value Date   HGBA1C 7.4 (A) 04/03/2021   Last diabetic Eye exam:  Lab Results  Component Value Date/Time   HMDIABEYEEXA No Retinopathy 10/22/2019 12:16 PM    Last diabetic Foot exam: No results found for: HMDIABFOOTEX       Component Value Date/Time   CHOL 186 11/18/2019 1416   TRIG 107 11/18/2019 1416   HDL 53 11/18/2019 1416   CHOLHDL 3.5 11/18/2019 1416   VLDL 27 01/03/2017 1419   LDLCALC 112 (H) 11/18/2019 1416    Hepatic Function Latest Ref Rng & Units 09/06/2020 11/18/2019 04/17/2018  Total Protein 6.1 - 8.1 g/dL 6.5 6.9 6.6  Albumin 3.6 - 5.1 g/dL - - -  AST 10 - 35 U/L _0 ALT 6 - 29 U/L _1 Alk Phosphatase 33 - 130 U/L - - -  Total Bilirubin 0.2 - 1.2 mg/dL 0.5 0.5 0.3    Lab Results  Component Value Date/Time   TSH 1.37 09/06/2020 12:28 PM   TSH 0.88 11/18/2019 02:16 PM    CBC Latest Ref Rng & Units 09/06/2020 11/18/2019 04/17/2018  WBC 3.8 - 10.8 Thousand/uL 9.5 10.1 10.3  Hemoglobin 11.7 - 15.5 g/dL 14.4 14.7 13.8  Hematocrit 35.0 - 45.0 % 44.9 46.4(H) 42.4  Platelets 140 - 400 Thousand/uL 222 223 229    Lab Results  Component Value Date/Time   VD25OH 65 11/18/2019 02:16 PM   VD25OH 41 01/03/2017 02:19 PM    Clinical ASCVD The 10-year ASCVD risk score (Arnett DK, et al., 2019) is: 38%   Values used to calculate the score:     Age: 19 years     Sex: Female     Is Non-Hispanic African American: No     Diabetic: Yes  Tobacco smoker: No     Systolic Blood Pressure: 458 mmHg     Is BP treated: Yes     HDL Cholesterol: 53 mg/dL     Total Cholesterol: 186 mg/dL     Social History   Tobacco Use  Smoking Status Never  Smokeless Tobacco Never   BP Readings from Last 3 Encounters:  04/03/21 124/68  01/11/21 128/71  09/06/20 128/84   Pulse Readings from Last 3 Encounters:  04/03/21 86  01/11/21 95  09/06/20 83   Wt Readings from Last 3 Encounters:  04/03/21 191 lb 0.6 oz (86.7 kg)  01/11/21 194 lb (88 kg)  09/06/20 198 lb (89.8 kg)    Assessment: Review of patient past medical history, allergies, medications, health status, including review of consultants reports, laboratory and other test data, was performed as part of comprehensive evaluation and provision  of chronic care management services.   SDOH:  (Social Determinants of Health) assessments and interventions performed:    CCM Care Plan  No Known Allergies  Medications Reviewed Today     Reviewed by Mertha Finders, CMA (Certified Medical Assistant) on 04/03/21 at 1352  Med List Status: <None>   Medication Order Taking? Sig Documenting Provider Last Dose Status Informant  aspirin 81 MG EC tablet [id number] Yes Take 1 tablet (81 mg total) by mouth daily. Swallow whole. Emeterio Reeve, DO Taking Active   Biotin 10000 MCG TABS [id number] Yes Take 10,000 mcg by mouth daily. [provider] Taking Active Self  Cholecalciferol (VITAMIN D-3) 1000 UNITS CAPS [id number] Yes Take 2,000 Units by mouth daily.  [provider] Taking Active Self  cyclobenzaprine (FLEXERIL) 10 MG tablet [id number] Yes TAKE ONE TABLET BY MOUTH THREE TIMES DAILY Emeterio Reeve, DO Taking Active   DULoxetine (CYMBALTA) 30 MG capsule [id number] Yes Take 3 capsules (90 mg total) by mouth daily. Emeterio Reeve, DO Taking Active   Erenumab-aooe (AIMOVIG) 70 MG/ML Darden Palmer [id number] Yes Inject 70 mg into the skin every 30 (thirty) days. Emeterio Reeve, DO Taking Active   fentaNYL ([place]) 50 MCG/HR [id number] Yes Place 1 patch onto the skin every 3 (three) days. Emeterio Reeve, DO Taking Active   fluticasone [facility]) 50 MCG/ACT nasal spray [id number] Yes Place 1 spray into both nostrils daily. Emeterio Reeve, DO Taking Active Self  glucose blood test strip [id number] Yes by Other route. Free Style Test Strips [provider] Taking Active Self  Homeopathic Products [facility] DRY EYE RELIEF OP) [id number] Yes Apply 1 drop to eye 2 (two) times daily. [provider] Taking Active Self  Lactobacillus (PROBIOTIC ACIDOPHILUS PO) [id number] Yes Take by mouth. [provider] Taking Active   metoprolol succinate (TOPROL-XL) 50 MG 24 hr tablet [id number] Yes Take 1 tablet  (50 mg total) by mouth daily. TAKE WITH OR IMMEDIATELY FOLLOWING A MEAL. Emeterio Reeve, DO Taking Active   montelukast (SINGULAIR) 10 MG tablet [id number] Yes Take 1 tablet (10 mg total) by mouth at bedtime. Emeterio Reeve, DO Taking Active   RABEprazole (ACIPHEX) 20 MG tablet [id number] Yes Take 1 tablet (20 mg total) by mouth daily. Emeterio Reeve, DO Taking Active   rizatriptan (MAXALT) 5 MG tablet [id number] Yes Take 1 tablet (5 mg total) by mouth as needed for migraine. May repeat in 2 hours if needed Emeterio Reeve, DO Taking Active   topiramate (TOPAMAX) 50 MG tablet [id number] Yes Take 3 tablets (150 mg total) by mouth at bedtime. Emeterio Reeve, DO Taking Active   triamcinolone (KENALOG) 0.1 % paste  [id number] Yes  [provider] Taking Active             Patient Active Problem List   Diagnosis Date Noted   Closed fracture of distal end of humerus 12/19/2017   Type 2 diabetes with nephropathy ([place]) 11/29/2015   Chronic kidney disease (CKD) 11/29/2015   Hx of bone density study 11/29/2015   Postmenopausal 08/31/2015   Chronic pain syndrome 06/23/2015   Levoscoliosis 06/23/2015   Retrolisthesis of vertebrae 06/23/2015   Degenerative disc disease, lumbar 06/23/2015   Herniation of intervertebral disc between L4 and L5 06/23/2015   Type 2 diabetes mellitus ([place]) 06/02/2015   Essential hypertension 06/02/2015   Migraine 06/02/2015   Seasonal allergies 06/02/2015   History of vitamin D deficiency 06/02/2015   Osteoporosis 06/02/2015   GERD (gastroesophageal reflux disease) 06/02/2015    Immunization History  Administered Date(s) Administered   Fluad Quad(high Dose 65+) 05/19/2019   Influenza, High Dose Seasonal PF 07/16/2018   Influenza,inj,Quad PF,6+ Mos 09/05/2016   Influenza-Unspecified 04/28/2015   PPD Test 12/16/2017   Pneumococcal Conjugate-13 06/04/2014   Pneumococcal Polysaccharide-23 09/20/2010   Tdap 11/29/2015    Conditions to be  addressed/monitored: HTN, DMII, and chronic pain  There are no care plans that you recently modified to display for this patient.   Medication Assistance: None required.  Patient affirms current coverage meets needs.  Patient's preferred pharmacy is:  [place], [place] - [address] [place] [address] Phone: [phone] Fax: [phone]  [facility] [place], [address] [place] [address] Phone: [phone] Fax: [phone]  Uses pill box? Yes Pt endorses 100% compliance  Follow Up:  Patient agrees to Care Plan and Follow-up.  Plan: Telephone follow up appointment with care management team member scheduled for:  6 months  Darius Bump

## 2021-05-16 NOTE — Patient Instructions (Signed)
Visit Information   PATIENT GOALS:   Goals Addressed             This Visit's Progress    Medication Management       Patient Goals/Self-Care Activities Over the next 180 days, patient will:  take medications as prescribed  Follow Up Plan: Telephone follow up appointment with care management team member scheduled for:  6 months         Consent to CCM Services: Ms. Recktenwald was given information about Chronic Care Management services including:  CCM service includes personalized support from designated clinical staff supervised by her physician, including individualized plan of care and coordination with other care providers 24/7 contact phone numbers for assistance for urgent and routine care needs. Service will only be billed when office clinical staff spend 20 minutes or more in a month to coordinate care. Only one practitioner may furnish and bill the service in a calendar month. The patient may stop CCM services at any time (effective at the end of the month) by phone call to the office staff. The patient will be responsible for cost sharing (co-pay) of up to 20% of the service fee (after annual deductible is met).  Patient agreed to services and verbal consent obtained.   Patient verbalizes understanding of instructions provided today and agrees to view in [place].   Telephone follow up appointment with care management team member scheduled for: 6 months [place]: Patient Care Plan: Medication Management     Problem Identified: DM, HTN, chronic pain      Long-Range Goal: Disease Progression Prevention   Start Date: 05/16/2021  This Visit's Progress: On track  Priority: High  Note:   Current Barriers:  None at present  Pharmacist Clinical Goal(s):  Over the next 180 days, patient will maintain control of chronic conditions  as evidenced by medication fill history, lab values, vital signs  through collaboration with PharmD and  provider.   Interventions: 1:1 collaboration with Emeterio Reeve, DO regarding development and update of comprehensive plan of care as evidenced by provider attestation and co-signature Inter-disciplinary care team collaboration (see longitudinal plan of care) Comprehensive medication review performed; medication list updated in electronic medical record  Diabetes:  Controlled; current treatment:lifestyle modifications only at this time (dc'd glyburide recently under discretion of PCP);   Current glucose readings: fasting glucose: 80-100s  Denies hypoglycemic/hyperglycemic symptoms  Current meal patterns: breakfast: raisin toast; lunch: skips lunch; dinner: carryout such as spaghetti, lasagna (provided by family); snacks: doesn't snack much; drinks: water, sometimes a "bai" drink  Current exercise: ambulates through the house  Counseled on BG goals, A1c goals Recommended continue current regimen and keep goal A1c of 7.5 given age and patient goals,  Hypertension:  Controlled; current treatment:Toprol XL 32m daily;   Current home readings: not currently checking  Denies hypotensive/hypertensive symptoms  Recommended continue current regimen,  Chronic Pain  Controlled; current treatment:fentanyl 574m patch q3days, cyclobenzaprine 1048maily PRN;   Recommended continue current regimen,   Patient Goals/Self-Care Activities Over the next 180 days, patient will:  take medications as prescribed  Follow Up Plan: Telephone follow up appointment with care management team member scheduled for:  6 months

## 2021-06-12 DIAGNOSIS — I10 Essential (primary) hypertension: Secondary | ICD-10-CM

## 2021-06-12 DIAGNOSIS — E1121 Type 2 diabetes mellitus with diabetic nephropathy: Secondary | ICD-10-CM | POA: Diagnosis not present

## 2021-07-03 ENCOUNTER — Telehealth: Payer: Self-pay

## 2021-07-03 NOTE — Telephone Encounter (Signed)
Medication: Erenumab-aooe (AIMOVIG) 70 MG/ML SOAJ Prior authorization submitted via CoverMyMeds on 07/03/2021 PA submission pending

## 2021-07-03 NOTE — Telephone Encounter (Signed)
Medication: Erenumab-aooe (AIMOVIG) 70 MG/ML SOAJ Prior authorization determination received Medication has been approved Approval dates: 06/03/2021-07/03/2022  Patient aware via: MyChart Pharmacy aware: Yes Provider aware via this encounter

## 2021-07-04 ENCOUNTER — Encounter: Payer: Self-pay | Admitting: Medical-Surgical

## 2021-07-04 ENCOUNTER — Ambulatory Visit (INDEPENDENT_AMBULATORY_CARE_PROVIDER_SITE_OTHER): Payer: Medicare Other | Admitting: Medical-Surgical

## 2021-07-04 ENCOUNTER — Other Ambulatory Visit: Payer: Self-pay

## 2021-07-04 VITALS — BP 130/89 | HR 88 | Resp 20 | Wt 188.0 lb

## 2021-07-04 DIAGNOSIS — Z2820 Immunization not carried out because of patient decision for unspecified reason: Secondary | ICD-10-CM | POA: Diagnosis not present

## 2021-07-04 DIAGNOSIS — E11649 Type 2 diabetes mellitus with hypoglycemia without coma: Secondary | ICD-10-CM

## 2021-07-04 DIAGNOSIS — M5136 Other intervertebral disc degeneration, lumbar region: Secondary | ICD-10-CM | POA: Diagnosis not present

## 2021-07-04 DIAGNOSIS — M5126 Other intervertebral disc displacement, lumbar region: Secondary | ICD-10-CM | POA: Diagnosis not present

## 2021-07-04 DIAGNOSIS — K219 Gastro-esophageal reflux disease without esophagitis: Secondary | ICD-10-CM

## 2021-07-04 DIAGNOSIS — Z7689 Persons encountering health services in other specified circumstances: Secondary | ICD-10-CM | POA: Diagnosis not present

## 2021-07-04 LAB — POCT GLYCOSYLATED HEMOGLOBIN (HGB A1C): HbA1c, POC (controlled diabetic range): 7.5 % — AB (ref 0.0–7.0)

## 2021-07-04 MED ORDER — GLYBURIDE 1.25 MG PO TABS: ORAL_TABLET | ORAL | 2 refills | Status: DC

## 2021-07-04 MED ORDER — FENTANYL 50 MCG/HR TD PT72: 1.0000 | MEDICATED_PATCH | TRANSDERMAL | 0 refills | Status: AC

## 2021-07-04 MED ORDER — RABEPRAZOLE SODIUM 20 MG PO TBEC: 20.0000 mg | DELAYED_RELEASE_TABLET | Freq: Every day | ORAL | 3 refills | Status: DC

## 2021-07-04 MED ORDER — RIZATRIPTAN BENZOATE 5 MG PO TABS: 5.0000 mg | ORAL_TABLET | ORAL | 0 refills | Status: DC | PRN

## 2021-07-04 NOTE — Progress Notes (Signed)
  HPI with pertinent ROS:   CC: Transfer care, pain management, diabetes follow-up  HPI: Pleasant 75 year old female presenting today to transfer care to a new PCP and to follow-up on:  Diabetes-has been checking her sugars regularly with home readings between 95 and 170s.  Notes her higher readings have been over the last week.  She was previously taking glyburide but was stopped over the summer due to hypoglycemic episodes.  She was diagnosed in 1992 with diabetes and feels that glyburide works well for her and is not interested in trying other medications at this point.  GERD-taking Aciphex daily, tolerating well without side effects.  Feels this keeps her symptoms well controlled.  Pain management-has an intact, current pain management contract.  She gets fentanyl 50 mcg patches, 1 every 72 hours for pain management.  She has been on this long-term, approximately 20 years or more per patient.  I reviewed the past medical history, family history, social history, surgical history, and allergies today and no changes were needed.  Please see the problem list section below in epic for further details.  Physical exam:   General: Well Developed, well nourished, and in no acute distress.  Neuro: Alert and oriented x3.  HEENT: Normocephalic, atraumatic.  Skin: Warm and dry. Cardiac: Regular rate and rhythm, no murmurs rubs or gallops, no lower extremity edema.  Respiratory: Clear to auscultation bilaterally. Not using accessory muscles, speaking in full sentences.  Impression and Recommendations:    1. Encounter to establish care Reviewed available information and discussed care concerns with patient.   2. Type 2 diabetes mellitus with hypoglycemia without coma, without long-term current use of insulin (HCC) POCT hemoglobin A1c 7.5% today.  With her recent elevations in glucose, restarting glyburide at 0.625 mg daily.  Advised patient to hold medication should her sugar be lower but if it is  elevated, to go ahead and take it. - POCT glycosylated hemoglobin (Hb A1C)  3. Degenerative disc disease, lumbar Reviewed her pain management contract.  Continue fentanyl as prescribed. - fentaNYL (DURAGESIC) 50 MCG/HR; Place 1 patch onto the skin every 3 (three) days.  Dispense: 10 patch; Refill: 0  4. Herniation of intervertebral disc between L4 and L5 See above. - fentaNYL (DURAGESIC) 50 MCG/HR; Place 1 patch onto the skin every 3 (three) days.  Dispense: 10 patch; Refill: 0  5. Gastroesophageal reflux disease without esophagitis Continue Aciphex as prescribed.  6. Vaccine refused by patient Declined all do vaccines today.  Return in about 3 months (around 10/04/2021) for chronic disease follow up. ___________________________________________ Thayer Ohm, DNP, APRN, FNP-BC Primary Care and Sports Medicine [facility] [place]

## 2021-08-04 ENCOUNTER — Telehealth: Payer: Self-pay

## 2021-08-04 NOTE — Telephone Encounter (Signed)
Medication: RABEprazole (ACIPHEX) 20 MG tablet Prior authorization submitted via CoverMyMeds on 08/04/2021 PA submission pending

## 2021-08-08 NOTE — Telephone Encounter (Signed)
Medication: RABEprazole (ACIPHEX) 20 MG tablet Prior authorization determination received Medication has been approved Approval dates: 07/09/2021-08/08/2022  Patient aware via: MyChart Pharmacy aware: Yes Provider aware via this encounter

## 2021-08-21 ENCOUNTER — Other Ambulatory Visit: Payer: Self-pay | Admitting: Osteopathic Medicine

## 2021-09-06 ENCOUNTER — Other Ambulatory Visit: Payer: Self-pay | Admitting: Osteopathic Medicine

## 2021-09-20 ENCOUNTER — Other Ambulatory Visit: Payer: Self-pay | Admitting: Osteopathic Medicine

## 2021-10-04 ENCOUNTER — Other Ambulatory Visit: Payer: Self-pay | Admitting: Osteopathic Medicine

## 2021-10-04 NOTE — Telephone Encounter (Signed)
Pt has an appt with Joy on 10/05/21. Please address at appt.

## 2021-10-05 ENCOUNTER — Other Ambulatory Visit: Payer: Self-pay

## 2021-10-05 ENCOUNTER — Ambulatory Visit (INDEPENDENT_AMBULATORY_CARE_PROVIDER_SITE_OTHER): Payer: Medicare Other | Admitting: Medical-Surgical

## 2021-10-05 ENCOUNTER — Encounter: Payer: Self-pay | Admitting: Medical-Surgical

## 2021-10-05 VITALS — BP 117/77 | HR 75 | Resp 20 | Ht 61.0 in | Wt 194.4 lb

## 2021-10-05 DIAGNOSIS — M5126 Other intervertebral disc displacement, lumbar region: Secondary | ICD-10-CM | POA: Diagnosis not present

## 2021-10-05 DIAGNOSIS — G43809 Other migraine, not intractable, without status migrainosus: Secondary | ICD-10-CM | POA: Diagnosis not present

## 2021-10-05 DIAGNOSIS — N183 Chronic kidney disease, stage 3 unspecified: Secondary | ICD-10-CM

## 2021-10-05 DIAGNOSIS — E11649 Type 2 diabetes mellitus with hypoglycemia without coma: Secondary | ICD-10-CM

## 2021-10-05 DIAGNOSIS — I10 Essential (primary) hypertension: Secondary | ICD-10-CM

## 2021-10-05 DIAGNOSIS — M5136 Other intervertebral disc degeneration, lumbar region: Secondary | ICD-10-CM | POA: Diagnosis not present

## 2021-10-05 DIAGNOSIS — K219 Gastro-esophageal reflux disease without esophagitis: Secondary | ICD-10-CM

## 2021-10-05 DIAGNOSIS — G894 Chronic pain syndrome: Secondary | ICD-10-CM | POA: Diagnosis not present

## 2021-10-05 NOTE — Progress Notes (Signed)
°  HPI with pertinent ROS:   CC: Chronic disease follow-up  HPI: Pleasant 76 year old female presenting today for chronic disease follow-up including:  Migraines-using Aimovig every 3 months and Maxalt as needed.  This is working well for her and she has no current complaints.  Chronic pain-using fentanyl 50 mcg patches every 3 days.  Has been treated long-term with this for nearly 20 years.  Also using Flexeril on a as needed basis but does take this regularly.  Taking Cymbalta as prescribed.  Feels that her symptoms are fairly well managed however she does have some days where her pain is worse than others.  Mood-taking Cymbalta as prescribed.  Feels like this is doing well for her symptoms.  Denies SI/HI.  Diabetes-checking sugars regularly and has had some hypoglycemic episodes with the lowest being 50.  Notes her highest sugar was in the 150s.  Hypoglycemic episodes usually occur if she does not eat enough.  Taking glyburide 1.25 mg daily as prescribed, tolerating well without side effects.  GERD-taking Aciphex 20 mg daily as prescribed, tolerating well without side effects.  Feels this keeps her reflux well managed.  Hypertension-taking Toprol XL 50 mg daily as prescribed, tolerating well without side effects.  Checking blood pressure periodically and notes that it usually runs 130/80 or less.  I reviewed the past medical history, family history, social history, surgical history, and allergies today and no changes were needed.  Please see the problem list section below in epic for further details.   Physical exam:   General: Well Developed, well nourished, and in no acute distress.  Neuro: Alert and oriented x3.  HEENT: Normocephalic, atraumatic.  Skin: Warm and dry. Cardiac: Regular rate and rhythm, no murmurs rubs or gallops, no lower extremity edema.  Respiratory: Clear to auscultation bilaterally. Not using accessory muscles, speaking in full sentences.  Impression and  Recommendations:    1. Type 2 diabetes mellitus with hypoglycemia without coma, without long-term current use of insulin (HCC) Checking hemoglobin A1c today.  Continue glyburide 1.25 mg daily. - Hemoglobin A1c  2. Degenerative disc disease, lumbar 3. Herniation of intervertebral disc between L4 and L5 Continue fentanyl 50 mcg patches changed every 72 hours.  Drug testing today.  Opioid contract on file and up-to-date (September 2022) - DRUG MONITOR, FENTANYL,W/CONF, URINE  4. Stage 3 chronic kidney disease, unspecified whether stage 3a or 3b CKD (HCC) Checking CMP today. - COMPLETE METABOLIC PANEL WITH GFR  5. Essential hypertension Blood pressure slightly elevated on initial check however on recheck is at goal.  Continue Toprol XL 50 mcg daily as prescribed. - CBC - COMPLETE METABOLIC PANEL WITH GFR  6. Chronic pain syndrome Start testing as above - DRUG MONITOR, FENTANYL,W/CONF, URINE  7. Gastroesophageal reflux disease without esophagitis Continue Aciphex 20 mg daily as prescribed.  8. Other migraine without status migrainosus, not intractable Continue Aimovig and Maxalt as prescribed.  Return in about 3 months (around 01/02/2022) for DM/HTN/chronic pain follow up. ___________________________________________ Clearnce Sorrel, DNP, APRN, FNP-BC Primary Care and [place]

## 2021-10-06 ENCOUNTER — Telehealth: Payer: Self-pay | Admitting: *Deleted

## 2021-10-06 NOTE — Chronic Care Management (AMB) (Signed)
°  Care Management   Note  10/06/2021 Name: [name] MRN: [id number] DOB: March 30, 1946  Teresa Medina is a 76 y.o. year old female who is a primary care patient of Christen Butter, NP and is actively engaged with the care management team. I reached out to [name] by phone today to assist with re-scheduling a follow up visit with the Pharmacist  Follow up plan: Unsuccessful telephone outreach attempt made. A HIPAA compliant phone message was left for the patient providing contact information and requesting a return call.   Burman Nieves, CCMA Care Guide, Embedded Care Coordination [facility]   Care Management  Direct Dial: [phone]

## 2021-10-10 LAB — CBC
HCT: 45.8 % — ABNORMAL HIGH (ref 35.0–45.0)
Hemoglobin: 14.5 g/dL (ref 11.7–15.5)
MCH: 27.2 pg (ref 27.0–33.0)
MCHC: 31.7 g/dL — ABNORMAL LOW (ref 32.0–36.0)
MCV: 85.8 fL (ref 80.0–100.0)
MPV: 10.3 fL (ref 7.5–12.5)
Platelets: 210 10*3/uL (ref 140–400)
RBC: 5.34 10*6/uL — ABNORMAL HIGH (ref 3.80–5.10)
RDW: 12.7 % (ref 11.0–15.0)
WBC: 9.8 10*3/uL (ref 3.8–10.8)

## 2021-10-10 LAB — COMPLETE METABOLIC PANEL WITH GFR
AG Ratio: 1.3 (calc) (ref 1.0–2.5)
ALT: 5 U/L — ABNORMAL LOW (ref 6–29)
AST: 11 U/L (ref 10–35)
Albumin: 3.8 g/dL (ref 3.6–5.1)
Alkaline phosphatase (APISO): 81 U/L (ref 37–153)
BUN/Creatinine Ratio: 22 (calc) (ref 6–22)
BUN: 23 mg/dL (ref 7–25)
CO2: 28 mmol/L (ref 20–32)
Calcium: 9.4 mg/dL (ref 8.6–10.4)
Chloride: 104 mmol/L (ref 98–110)
Creat: 1.06 mg/dL — ABNORMAL HIGH (ref 0.60–1.00)
Globulin: 2.9 g/dL (calc) (ref 1.9–3.7)
Glucose, Bld: 68 mg/dL (ref 65–139)
Potassium: 4.4 mmol/L (ref 3.5–5.3)
Sodium: 139 mmol/L (ref 135–146)
Total Bilirubin: 0.6 mg/dL (ref 0.2–1.2)
Total Protein: 6.7 g/dL (ref 6.1–8.1)
eGFR: 54 mL/min/{1.73_m2} — ABNORMAL LOW (ref >=60)

## 2021-10-10 LAB — DRUG MONITOR, FENTANYL,W/CONF, URINE
Fentanyl: NEGATIVE ng/mL (ref <0.5)
Fentanyl: NEGATIVE ng/mL (ref <0.5)
Norfentanyl: NEGATIVE ng/mL (ref <0.5)

## 2021-10-10 LAB — HEMOGLOBIN A1C
Hgb A1c MFr Bld: 5.9 % of total Hgb — ABNORMAL HIGH (ref <5.7)
Mean Plasma Glucose: 123 mg/dL
eAG (mmol/L): 6.8 mmol/L

## 2021-10-10 LAB — DM TEMPLATE

## 2021-10-11 ENCOUNTER — Other Ambulatory Visit: Payer: Self-pay | Admitting: Medical-Surgical

## 2021-10-11 NOTE — Addendum Note (Signed)
Addended byChristen Butter on: 10/11/2021 07:42 AM   Modules accepted: Orders

## 2021-10-17 ENCOUNTER — Other Ambulatory Visit: Payer: Self-pay | Admitting: Medical-Surgical

## 2021-10-18 NOTE — Chronic Care Management (AMB) (Signed)
?  Care Management  ? ?Note ? ?10/18/2021 ?Name: Teresa Medina MRN: [id number] DOB: November 01, 1945 ? ?Teresa Medina is a 76 y.o. year old female who is a primary care patient of Christen Butter, NP and is actively engaged with the care management team. I reached out to [name] by phone today to assist with re-scheduling a follow up visit with the Pharmacist ? ?Follow up plan: ?Telephone appointment with care management team member scheduled for: 02/08/2022 ? ?Dashawna Delbridge, CCMA ?Care Guide, Embedded Care Coordination ?[place]  Care Management  ?Direct Dial: [phone] ? ? ?

## 2021-11-10 ENCOUNTER — Other Ambulatory Visit: Payer: Self-pay

## 2021-11-10 NOTE — Progress Notes (Unsigned)
Patient called requesting a refill on this medication. 

## 2021-11-14 ENCOUNTER — Encounter: Payer: Self-pay | Admitting: Medical-Surgical

## 2021-11-22 ENCOUNTER — Telehealth: Payer: Self-pay

## 2021-11-22 NOTE — Telephone Encounter (Addendum)
Initiated Prior authorization VOJ:[id number] 50MCG/HR 72 hr patches ?Via: Covermymeds ?Case/Key: [id number] ?Status: denied as of 11/22/21 ?Reason:the use of this medication without provider agreement to assess the patient for signs and ?symptoms of serotonin syndrome does not establish medical necessity for this drug. Medical ?necessity is determined by adherence to generally accepted standards of medical practice in ?the [place], is clinically appropriate, in terms of type, frequency, extent, site, duration ?and considered effective for the patient's illness, injury, disease, or its symptoms. For more ?information, please refer to the [employer] brochure ?Notified Pt via: Mychart ?

## 2021-11-23 ENCOUNTER — Telehealth: Payer: Medicare Other

## 2021-12-05 ENCOUNTER — Telehealth: Payer: Self-pay

## 2021-12-05 DIAGNOSIS — G894 Chronic pain syndrome: Secondary | ICD-10-CM

## 2021-12-05 DIAGNOSIS — M5136 Other intervertebral disc degeneration, lumbar region: Secondary | ICD-10-CM

## 2021-12-05 NOTE — Telephone Encounter (Signed)
Patients son called requesting a new referral to pain management because thy trashed her referral due to her missing her appointment and they also need the radiology on her lumbar. ?

## 2021-12-06 NOTE — Telephone Encounter (Signed)
Referral placed. She will need to make sure to attend her appointment with the new place as there are only a few places for pain management and she will need to be established with one of them in order to maintain her prescription as we will not be managing it going forward. ? ?___________________________________________ ?Thayer Ohm, DNP, APRN, FNP-BC ?Primary Care and Sports Medicine ?[facility] ? ?

## 2021-12-08 ENCOUNTER — Other Ambulatory Visit: Payer: Self-pay | Admitting: Medical-Surgical

## 2021-12-08 NOTE — Telephone Encounter (Signed)
Patient's son called wanting to see if Caryl Asp would increase the dosage on patient's Fentanyl. Last dose was Fentanyl 50 mcg/hr to use one patch every three days.  ? ?It's not on current medication list, and I see that patient is being sent to pain management.  ? ?Please advise. Son Randall Hiss can be reached at [phone]. ?

## 2021-12-08 NOTE — Telephone Encounter (Signed)
Final refill. Referral in place to [facility] at [phone]. Patient will need to reach out to them or be on the lookout for a call to get scheduled. She must attend her appointment with them if she would like her prescription continued.  ?

## 2021-12-11 NOTE — Telephone Encounter (Signed)
Message taken in triage on Friday, please follow up with patient/son.  ?

## 2021-12-11 NOTE — Telephone Encounter (Signed)
Attempted to contact patient regarding provider's note & rx refill. No answer. Unable to leave a voicemail msg. Vm box is full.  ?

## 2021-12-15 ENCOUNTER — Other Ambulatory Visit: Payer: Self-pay | Admitting: Medical-Surgical

## 2021-12-20 ENCOUNTER — Other Ambulatory Visit: Payer: Self-pay

## 2021-12-20 ENCOUNTER — Other Ambulatory Visit: Payer: Self-pay | Admitting: Osteopathic Medicine

## 2021-12-20 DIAGNOSIS — G894 Chronic pain syndrome: Secondary | ICD-10-CM

## 2021-12-20 MED ORDER — METOPROLOL SUCCINATE ER 50 MG PO TB24: 50.0000 mg | ORAL_TABLET | Freq: Every day | ORAL | 3 refills | Status: DC

## 2021-12-20 MED ORDER — DULOXETINE HCL 30 MG PO CPEP: 90.0000 mg | ORAL_CAPSULE | Freq: Every day | ORAL | 3 refills | Status: DC

## 2021-12-20 MED ORDER — MONTELUKAST SODIUM 10 MG PO TABS: 10.0000 mg | ORAL_TABLET | Freq: Every day | ORAL | 3 refills | Status: DC

## 2021-12-20 MED ORDER — ASPIRIN 81 MG PO TBEC: 81.0000 mg | DELAYED_RELEASE_TABLET | Freq: Every day | ORAL | 3 refills | Status: DC

## 2021-12-20 MED ORDER — TOPIRAMATE 50 MG PO TABS: 150.0000 mg | ORAL_TABLET | Freq: Every day | ORAL | 3 refills | Status: DC

## 2021-12-22 ENCOUNTER — Other Ambulatory Visit: Payer: Self-pay

## 2021-12-22 ENCOUNTER — Other Ambulatory Visit: Payer: Self-pay | Admitting: Medical-Surgical

## 2021-12-22 MED ORDER — FREESTYLE LITE TEST VI STRP
ORAL_STRIP | 99 refills | Status: AC
  Filled 2021-12-22: qty 100, 50d supply, fill #0

## 2021-12-25 ENCOUNTER — Telehealth: Payer: Self-pay | Admitting: Medical-Surgical

## 2021-12-25 NOTE — Telephone Encounter (Signed)
Per an email received from [place], who works [employer] exercise for the [employer] of [employer], the patient's son Randall Hiss tried to contact our office on Friday. He was unable to get through to [place] and somehow ended up speaking with [place]. He was very upset at his inability to reach someone here. Please contact the patient and/or her son Randall Hiss to find out what the situation is. Also please remind him that there are times that he is better served to leave a voicemail so that we can call back but that it may take until the end of the day or the next morning (if not urgent) due to busy clinic schedules.  ? ?___________________________________________ ?Clearnce Sorrel, DNP, APRN, FNP-BC ?Primary Care and Sports Medicine ?[place] ? ?

## 2021-12-25 NOTE — Telephone Encounter (Signed)
Attempted to contact patients son. Unable to leave message/voicemail box is full.  ?

## 2021-12-27 ENCOUNTER — Other Ambulatory Visit: Payer: Self-pay | Admitting: Medical-Surgical

## 2021-12-27 NOTE — Telephone Encounter (Signed)
Patient has been referred to pain management for further management of her chronic pain and opioid prescriptions. She no showed the initial appointment so a new referral was entered. The referral has been authorized per the notes in the chart. Please provide the number and contact information to her to get scheduled if she is not already. Will not further manage the Fentanyl patches if no appointment in place to establish with the Pain clinic.  ?

## 2022-01-02 ENCOUNTER — Encounter: Payer: Medicare Other | Admitting: Medical-Surgical

## 2022-01-02 NOTE — Progress Notes (Signed)
   Established Patient Office Visit  Subjective   Patient ID: Teresa Medina, female    DOB: 1946-08-12  Age: 76 y.o. MRN: [id number]  No chief complaint on file.   HPI  {History (Optional):23778}  ROS    Objective:     There were no vitals taken for this visit. {Vitals History (Optional):23777}  Physical Exam   No results found for any visits on 01/02/22.  {Labs (Optional):23779}  The 10-year ASCVD risk score (Arnett DK, et al., 2019) is: 37.9%    Assessment & Plan:   Problem List Items Addressed This Visit   None   No follow-ups on file.    Christen Butter, NP

## 2022-01-05 ENCOUNTER — Encounter: Payer: Self-pay | Admitting: Medical-Surgical

## 2022-01-05 ENCOUNTER — Ambulatory Visit (INDEPENDENT_AMBULATORY_CARE_PROVIDER_SITE_OTHER): Payer: Medicare Other | Admitting: Medical-Surgical

## 2022-01-05 VITALS — BP 120/79 | HR 78 | Resp 20 | Ht 61.0 in | Wt 194.0 lb

## 2022-01-05 DIAGNOSIS — K219 Gastro-esophageal reflux disease without esophagitis: Secondary | ICD-10-CM

## 2022-01-05 DIAGNOSIS — G894 Chronic pain syndrome: Secondary | ICD-10-CM | POA: Diagnosis not present

## 2022-01-05 DIAGNOSIS — G43809 Other migraine, not intractable, without status migrainosus: Secondary | ICD-10-CM | POA: Diagnosis not present

## 2022-01-05 DIAGNOSIS — N183 Chronic kidney disease, stage 3 unspecified: Secondary | ICD-10-CM

## 2022-01-05 DIAGNOSIS — E1121 Type 2 diabetes mellitus with diabetic nephropathy: Secondary | ICD-10-CM

## 2022-01-05 DIAGNOSIS — I10 Essential (primary) hypertension: Secondary | ICD-10-CM | POA: Diagnosis not present

## 2022-01-05 LAB — POCT UA - MICROALBUMIN
Albumin/Creatinine Ratio, Urine, POC: <30
Creatinine, POC: 200 mg/dL
Microalbumin Ur, POC: 10 mg/L

## 2022-01-05 LAB — POCT GLYCOSYLATED HEMOGLOBIN (HGB A1C): HbA1c, POC (controlled diabetic range): 5.8 % (ref 0.0–7.0)

## 2022-01-05 MED ORDER — CYCLOBENZAPRINE HCL 10 MG PO TABS: 10.0000 mg | ORAL_TABLET | Freq: Three times a day (TID) | ORAL | 1 refills | Status: DC

## 2022-01-05 MED ORDER — DULOXETINE HCL 30 MG PO CPEP: 90.0000 mg | ORAL_CAPSULE | Freq: Every day | ORAL | 3 refills | Status: AC

## 2022-01-05 MED ORDER — ASPIRIN 81 MG PO TBEC: 81.0000 mg | DELAYED_RELEASE_TABLET | Freq: Every day | ORAL | 3 refills | Status: AC

## 2022-01-05 MED ORDER — TOPIRAMATE 50 MG PO TABS: 150.0000 mg | ORAL_TABLET | Freq: Every day | ORAL | 3 refills | Status: AC

## 2022-01-05 MED ORDER — MONTELUKAST SODIUM 10 MG PO TABS: 10.0000 mg | ORAL_TABLET | Freq: Every day | ORAL | 3 refills | Status: AC

## 2022-01-05 MED ORDER — METOPROLOL SUCCINATE ER 50 MG PO TB24
50.0000 mg | ORAL_TABLET | Freq: Every day | ORAL | 3 refills | Status: AC
Start: 2022-01-05 — End: ?

## 2022-01-05 NOTE — Assessment & Plan Note (Signed)
Well-controlled.  Continue Aimovig monthly and as needed Maxalt.

## 2022-01-05 NOTE — Patient Instructions (Addendum)
[place] [facility] (new referral- Rondall Allegra)  [facility] at [phone] (first referral that was placed)

## 2022-01-05 NOTE — Assessment & Plan Note (Signed)
POCT hemoglobin A1c 5.8% today.  POCT microalbumin normal.  Continue glyburide 1.25 mg daily.

## 2022-01-05 NOTE — Assessment & Plan Note (Signed)
Reviewed records and information with patient and her son.  Discussed the need for referral to pain management and the rationale behind getting her in with the pain clinic.  Feel that her pain would be better managed with a different regimen and that there will be more options if under the care of a pain clinic.  For now, we will continue her fentanyl patches until she can get established.  I did include the names of the locations the referrals were sent to as well as the phone numbers.  Advised the patient and her son to reach out to the provided numbers about getting scheduled for an appointment.  Note sent to our referral coordinator to see if we can get the referral reopened for the [facility] location clinic due to confusion about the communication.

## 2022-01-05 NOTE — Assessment & Plan Note (Signed)
Well-controlled.  Continue Toprol-XL 50 mg daily.

## 2022-01-05 NOTE — Assessment & Plan Note (Signed)
Well-controlled.  Continue Aciphex 20 mg daily.

## 2022-01-05 NOTE — Assessment & Plan Note (Signed)
Last check showing renal function is stable.  Continue adequate control of blood pressure and glucose to prevent worsening of CKD.

## 2022-01-05 NOTE — Progress Notes (Signed)
Established Patient Office Visit  Subjective   Patient ID: Teresa Medina, female   DOB: 19-Sep-1945 Age: 76 y.o. MRN: [id number]   Chief Complaint  Patient presents with   Follow-up    HPI Pleasant 76 year old female presenting today for the following:  Diabetes: Taking glyburide 1.25 mg daily, tolerating well without side effects.  Checking sugars regularly.  No recent concerns.  Hypertension: Taking metoprolol 50 mg daily, tolerating well without side effects.  Migraines: Taking Aimovig injections monthly and uses rizatriptan 5 mg as needed.  Symptoms well controlled.  GERD: Taking Aciphex 20 mg daily, tolerating well without side effects.  Symptoms well controlled.  Chronic pain: Has been having difficulty getting fentanyl patches covered due to insurance concerns about monitoring.  She was referred to the pain clinic here in [place] however she reports that she was never told this and did not show for the appointment.  That referral has been closed.  On notification, another referral was replaced however she has not been in contact with their office either.  Feels that her pain limits her activity however she is very sedentary and avoidant of activity despite encouragement by her son.  She currently uses fentanyl 50 mcg/h patches along with Cymbalta 90 mg daily and Flexeril 10 mg 3 times daily for pain control.  Notes that her pain is still significant and not well controlled.  Review of Systems  Constitutional:  Negative for chills, fever and malaise/fatigue.  Respiratory:  Negative for cough, shortness of breath and wheezing.   Cardiovascular:  Negative for chest pain, palpitations and leg swelling.  Gastrointestinal:  Negative for heartburn.  Musculoskeletal:  Positive for back pain, joint pain, myalgias and neck pain.  Neurological:  Negative for dizziness and headaches.  Psychiatric/Behavioral:  Negative for depression and suicidal ideas. The patient is not  nervous/anxious and does not have insomnia.      Objective:    Vitals:   01/05/22 1525  BP: 120/79  Pulse: 78  Resp: 20  Height: 5\' 1"  (1.549 m)  Weight: 194 lb (88 kg)  SpO2: 97%  BMI (Calculated): 36.67     Physical Exam Vitals and nursing note reviewed.  Constitutional:      General: She is not in acute distress.    Appearance: Normal appearance. She is obese. She is not ill-appearing.  HENT:     Head: Normocephalic and atraumatic.  Cardiovascular:     Rate and Rhythm: Normal rate and regular rhythm.     Pulses: Normal pulses.     Heart sounds: Normal heart sounds.  Pulmonary:     Effort: Pulmonary effort is normal. No respiratory distress.     Breath sounds: Normal breath sounds. No wheezing, rhonchi or rales.  Skin:    General: Skin is warm and dry.  Neurological:     Mental Status: She is alert and oriented to person, place, and time.     Gait: Gait abnormal.  Psychiatric:        Mood and Affect: Mood normal.        Behavior: Behavior normal.        Thought Content: Thought content normal.        Judgment: Judgment normal.   Results for orders placed or performed in visit on 01/05/22 (from the past 24 hour(s))  POCT HgB A1C     Status: None   Collection Time: 01/05/22  4:26 PM  Result Value Ref Range   Hemoglobin A1C     HbA1c POC (<>  result, manual entry)     HbA1c, POC (prediabetic range)     HbA1c, POC (controlled diabetic range) 5.8 0.0 - 7.0 %  POCT UA - Microalbumin     Status: Normal   Collection Time: 01/05/22  4:31 PM  Result Value Ref Range   Microalbumin Ur, POC 10 mg/L   Creatinine, POC 200 mg/dL   Albumin/Creatinine Ratio, Urine, POC <30        The 10-year ASCVD risk score (Arnett DK, et al., 2019) is: 39.5%   Values used to calculate the score:     Age: 23 years     Sex: Female     Is Non-Hispanic African American: No     Diabetic: Yes     Tobacco smoker: No     Systolic Blood Pressure: 120 mmHg     Is BP treated: Yes     HDL  Cholesterol: 53 mg/dL     Total Cholesterol: 186 mg/dL   Assessment & Plan:   Essential hypertension Well-controlled.  Continue Toprol-XL 50 mg daily.  Migraine Well-controlled.  Continue Aimovig monthly and as needed Maxalt.  GERD (gastroesophageal reflux disease) Well-controlled.  Continue Aciphex 20 mg daily.  Type 2 diabetes with nephropathy (HCC) POCT hemoglobin A1c 5.8% today.  POCT microalbumin normal.  Continue glyburide 1.25 mg daily.  Chronic kidney disease (CKD) Last check showing renal function is stable.  Continue adequate control of blood pressure and glucose to prevent worsening of CKD.  Chronic pain syndrome Reviewed records and information with patient and her son.  Discussed the need for referral to pain management and the rationale behind getting her in with the pain clinic.  Feel that her pain would be better managed with a different regimen and that there will be more options if under the care of a pain clinic.  For now, we will continue her fentanyl patches until she can get established.  I did include the names of the locations the referrals were sent to as well as the phone numbers.  Advised the patient and her son to reach out to the provided numbers about getting scheduled for an appointment.  Note sent to our referral coordinator to see if we can get the referral reopened for the [facility] location clinic due to confusion about the communication.  Return in about 6 months (around 07/08/2022) for chronic disease follow up.  ___________________________________________ Thayer Ohm, DNP, APRN, FNP-BC Primary Care and Sports Medicine [facility] [place]

## 2022-01-10 ENCOUNTER — Telehealth: Payer: Self-pay

## 2022-01-10 ENCOUNTER — Other Ambulatory Visit: Payer: Self-pay | Admitting: Medical-Surgical

## 2022-01-10 NOTE — Telephone Encounter (Signed)
Initiated Prior authorization IB:[id number] 50MCG/HR 72 hr patches Via: Covermymeds Case/Key:[id number] Status: approved  as of 01/10/22 Reason:12/11/2021 through 07/09/2022. Notified Pt via: [facility] pt , called  pharmacy to notify them of pa approval

## 2022-01-18 ENCOUNTER — Other Ambulatory Visit: Payer: Self-pay

## 2022-02-01 ENCOUNTER — Ambulatory Visit (INDEPENDENT_AMBULATORY_CARE_PROVIDER_SITE_OTHER): Payer: Medicare Other | Admitting: Family Medicine

## 2022-02-01 DIAGNOSIS — Z0000 Encounter for general adult medical examination without abnormal findings: Secondary | ICD-10-CM | POA: Diagnosis not present

## 2022-02-01 DIAGNOSIS — Z780 Asymptomatic menopausal state: Secondary | ICD-10-CM | POA: Diagnosis not present

## 2022-02-01 NOTE — Patient Instructions (Addendum)
MEDICARE ANNUAL WELLNESS VISIT Health Maintenance Summary and Written Plan of Care  [name] ,  Thank you for allowing me to perform your Medicare Annual Wellness Visit and for your ongoing commitment to your health.   Health Maintenance & Immunization History Health Maintenance  Topic Date Due   OPHTHALMOLOGY EXAM  02/01/2022 (Originally 10/21/2020)   FOOT EXAM  02/02/2022 (Originally 08/30/2016)   COVID-19 Vaccine (1) 02/17/2022 (Originally 03/03/1946)   Zoster Vaccines- Shingrix (1 of 2) 05/04/2022 (Originally 09/03/1994)   HEMOGLOBIN A1C  07/08/2022   URINE MICROALBUMIN  01/06/2023   Pneumonia Vaccine 88+ Years old  Completed   DEXA SCAN  Completed   Hepatitis C Screening  Addressed   HPV VACCINES  Aged Out   Immunization History  Administered Date(s) Administered   Fluad Quad(high Dose 65+) 05/19/2019   Influenza, High Dose Seasonal PF 07/16/2018   Influenza,inj,Quad PF,6+ Mos 09/05/2016   Influenza-Unspecified 04/28/2015   PPD Test 12/16/2017   Pneumococcal Conjugate-13 06/04/2014   Pneumococcal Polysaccharide-23 09/20/2010   Tdap 11/29/2015    These are the patient goals that we discussed:  Goals Addressed             This Visit's Progress    Patient Stated       02/01/2022 AWV Goal: Exercise for General Health  Patient will verbalize understanding of the benefits of increased physical activity: Exercising regularly is important. It will improve your overall fitness, flexibility, and endurance. Regular exercise also will improve your overall health. It can help you control your weight, reduce stress, and improve your bone density. Over the next year, patient will increase physical activity as tolerated with a goal of at least 150 minutes of moderate physical activity per week.  You can tell that you are exercising at a moderate intensity if your heart starts beating faster and you start breathing faster but can still hold a conversation. Moderate-intensity  exercise ideas include: Walking 1 mile (1.6 km) in about 15 minutes Biking Hiking Golfing Dancing Water aerobics Patient will verbalize understanding of everyday activities that increase physical activity by providing examples like the following: Yard work, such as: Insurance underwriter Gardening Washing windows or floors Patient will be able to explain general safety guidelines for exercising:  Before you start a new exercise program, talk with your health care provider. Do not exercise so much that you hurt yourself, feel dizzy, or get very short of breath. Wear comfortable clothes and wear shoes with good support. Drink plenty of water while you exercise to prevent dehydration or heat stroke. Work out until your breathing and your heartbeat get faster.          This is a list of Health Maintenance Items that are overdue or due now: Bone densitometry screening Shingrix vaccine Eye exam  Foot exam    Orders/Referrals Placed Today: Orders Placed This Encounter  Procedures   DEXAScan    Standing Status:   Future    Standing Expiration Date:   02/02/2023    Scheduling Instructions:     Please call patient to schedule.    Order Specific Question:   Reason for exam:    Answer:   Post menopausal    Order Specific Question:   Preferred imaging location?    Answer:   [facility]   (Contact our referral department at [phone] if you have not spoken with someone about your referral appointment within the  next 5 days)    Follow-up Plan Follow-up with Christen Butter, NP as planned Schedule your Shingrix vaccine at your pharmacy Foot exam can be done at your next office visit Schedule your eye exam. Bone density scan referral has been sent and they will call you to schedule. Medicare wellness visit in one year. Patient will access AVS on mychart.      Health Maintenance,  Female Adopting a healthy lifestyle and getting preventive care are important in promoting health and wellness. Ask your health care provider about: The right schedule for you to have regular tests and exams. Things you can do on your own to prevent diseases and keep yourself healthy. What should I know about diet, weight, and exercise? Eat a healthy diet  Eat a diet that includes plenty of vegetables, fruits, low-fat dairy products, and lean protein. Do not eat a lot of foods that are high in solid fats, added sugars, or sodium. Maintain a healthy weight Body mass index (BMI) is used to identify weight problems. It estimates body fat based on height and weight. Your health care provider can help determine your BMI and help you achieve or maintain a healthy weight. Get regular exercise Get regular exercise. This is one of the most important things you can do for your health. Most adults should: Exercise for at least 150 minutes each week. The exercise should increase your heart rate and make you sweat (moderate-intensity exercise). Do strengthening exercises at least twice a week. This is in addition to the moderate-intensity exercise. Spend less time sitting. Even light physical activity can be beneficial. Watch cholesterol and blood lipids Have your blood tested for lipids and cholesterol at 77 years of age, then have this test every 5 years. Have your cholesterol levels checked more often if: Your lipid or cholesterol levels are high. You are older than 77 years of age. You are at high risk for heart disease. What should I know about cancer screening? Depending on your health history and family history, you may need to have cancer screening at various ages. This may include screening for: Breast cancer. Cervical cancer. Colorectal cancer. Skin cancer. Lung cancer. What should I know about heart disease, diabetes, and high blood pressure? Blood pressure and heart disease High blood  pressure causes heart disease and increases the risk of stroke. This is more likely to develop in people who have high blood pressure readings or are overweight. Have your blood pressure checked: Every 3-5 years if you are 81-71 years of age. Every year if you are 35 years old or older. Diabetes Have regular diabetes screenings. This checks your fasting blood sugar level. Have the screening done: Once every three years after age 53 if you are at a normal weight and have a low risk for diabetes. More often and at a younger age if you are overweight or have a high risk for diabetes. What should I know about preventing infection? Hepatitis B If you have a higher risk for hepatitis B, you should be screened for this virus. Talk with your health care provider to find out if you are at risk for hepatitis B infection. Hepatitis C Testing is recommended for: Everyone born from [birth year] through 1966. Anyone with known risk factors for hepatitis C. Sexually transmitted infections (STIs) Get screened for STIs, including gonorrhea and chlamydia, if: You are sexually active and are younger than 77 years of age. You are older than 77 years of age and your health care provider tells  you that you are at risk for this type of infection. Your sexual activity has changed since you were last screened, and you are at increased risk for chlamydia or gonorrhea. Ask your health care provider if you are at risk. Ask your health care provider about whether you are at high risk for HIV. Your health care provider may recommend a prescription medicine to help prevent HIV infection. If you choose to take medicine to prevent HIV, you should first get tested for HIV. You should then be tested every 3 months for as long as you are taking the medicine. Pregnancy If you are about to stop having your period (premenopausal) and you may become pregnant, seek counseling before you get pregnant. Take 400 to 800 micrograms (mcg) of folic  acid every day if you become pregnant. Ask for birth control (contraception) if you want to prevent pregnancy. Osteoporosis and menopause Osteoporosis is a disease in which the bones lose minerals and strength with aging. This can result in bone fractures. If you are 60 years old or older, or if you are at risk for osteoporosis and fractures, ask your health care provider if you should: Be screened for bone loss. Take a calcium or vitamin D supplement to lower your risk of fractures. Be given hormone replacement therapy (HRT) to treat symptoms of menopause. Follow these instructions at home: Alcohol use Do not drink alcohol if: Your health care provider tells you not to drink. You are pregnant, may be pregnant, or are planning to become pregnant. If you drink alcohol: Limit how much you have to: 0-1 drink a day. Know how much alcohol is in your drink. In the [place] one drink equals one 12 oz bottle of beer (355 mL), one 5 oz glass of wine (148 mL), or one 1 oz glass of hard liquor (44 mL). Lifestyle Do not use any products that contain nicotine or tobacco. These products include cigarettes, chewing tobacco, and vaping devices, such as e-cigarettes. If you need help quitting, ask your health care provider. Do not use street drugs. Do not share needles. Ask your health care provider for help if you need support or information about quitting drugs. General instructions Schedule regular health, dental, and eye exams. Stay current with your vaccines. Tell your health care provider if: You often feel depressed. You have ever been abused or do not feel safe at home. Summary Adopting a healthy lifestyle and getting preventive care are important in promoting health and wellness. Follow your health care provider's instructions about healthy diet, exercising, and getting tested or screened for diseases. Follow your health care provider's instructions on monitoring your cholesterol and blood  pressure. This information is not intended to replace advice given to you by your health care provider. Make sure you discuss any questions you have with your health care provider. Document Revised: 12/19/2020 Document Reviewed: 12/19/2020 Elsevier Patient Education  2023 [employer].

## 2022-02-01 NOTE — Progress Notes (Signed)
MEDICARE ANNUAL WELLNESS VISIT  02/01/2022  Telephone Visit Disclaimer This Medicare AWV was conducted by telephone due to national recommendations for restrictions regarding the COVID-19 Pandemic (e.g. social distancing).  I verified, using two identifiers, that I am speaking with [facility] or their authorized healthcare agent. I discussed the limitations, risks, security, and privacy concerns of performing an evaluation and management service by telephone and the potential availability of an in-person appointment in the future. The patient expressed understanding and agreed to proceed.  Location of Patient: Home Location of Provider (nurse):  Provider home  Subjective:    Teresa Medina is a 76 y.o. female patient of Samuel Bouche, NP who had a Medicare Annual Wellness Visit today via telephone. Teresa Medina is Retired and lives with their son. she has 2 children. she reports that she is socially active and does interact with friends/family regularly. she is moderately physically active and enjoys playing games on her tablet.  Patient Care Team: Samuel Bouche, NP as PCP - General (Nurse Practitioner) Darius Bump, [facility] as Pharmacist (Pharmacist)     02/01/2022    9:48 AM 01/11/2021   11:08 AM 04/28/2019    4:09 PM 12/14/2017    6:34 PM 04/25/2017    3:07 PM  Advanced Directives  Does Patient Have a Medical Advance Directive? Yes Yes Yes Yes No  Type of Advance Directive Living will Living will;Healthcare Power of [place];Living will Living will   Does patient want to make changes to medical advance directive? No - Patient declined No - Patient declined No - Patient declined No - Patient declined   Copy of [place] in Chart?  No - copy requested No - copy requested      Hospital Utilization Over the Past 12 Months: # of hospitalizations or ER visits: 0 # of surgeries: 0  Review of Systems    Patient reports that her overall  health is unchanged compared to last year.  History obtained from chart review and the patient  Patient Reported Readings (BP, Pulse, CBG, Weight, etc) none  Pain Assessment Pain : No/denies pain     Current Medications & Allergies (verified) Allergies as of 02/01/2022   No Known Allergies      Medication List        Accurate as of February 01, 2022 10:07 AM. If you have any questions, ask your nurse or doctor.          Aimovig 70 MG/ML Soaj Generic drug: Erenumab-aooe Inject 70 mg into the skin every 30 (thirty) days.   aspirin EC 81 MG tablet Take 1 tablet (81 mg total) by mouth daily. Swallow whole.   Biotin 10000 MCG Tabs Take 10,000 mcg by mouth daily.   cyclobenzaprine 10 MG tablet Commonly known as: FLEXERIL Take 1 tablet (10 mg total) by mouth 3 (three) times daily.   DULoxetine 30 MG capsule Commonly known as: CYMBALTA Take 3 capsules (90 mg total) by mouth daily.   fentaNYL 50 MCG/HR Commonly known as: [place] 1 patch onto the skin every 3 (three) days.   fluticasone 50 MCG/ACT nasal spray Commonly known as: FLONASE Place 1 spray into both nostrils daily.   FREESTYLE LITE test strip Generic drug: glucose blood Check fasting blood sugar every morning and 2 hours after the largest meal of the day   glyBURIDE 1.25 MG tablet Commonly known as: DIABETA Take 1/2 tablet by mouth daily   metoprolol succinate 50 MG 24  hr tablet Commonly known as: TOPROL-XL Take 1 tablet (50 mg total) by mouth daily. TAKE WITH OR IMMEDIATELY FOLLOWING A MEAL.   montelukast 10 MG tablet Commonly known as: SINGULAIR Take 1 tablet (10 mg total) by mouth at bedtime.   RABEprazole 20 MG tablet Commonly known as: ACIPHEX Take 1 tablet (20 mg total) by mouth daily.   rizatriptan 5 MG tablet Commonly known as: MAXALT Take 1 tablet (5 mg total) by mouth as needed for migraine. May repeat in 2 hours if needed   [facility] DRY EYE RELIEF OP Apply 1 drop to eye 2  (two) times daily.   topiramate 50 MG tablet Commonly known as: TOPAMAX Take 3 tablets (150 mg total) by mouth at bedtime.   triamcinolone 0.1 % paste Commonly known as: KENALOG   Vitamin D-3 25 MCG (1000 UT) Caps Take 2,000 Units by mouth daily.        History (reviewed): Past Medical History:  Diagnosis Date   Arthritis    Chronic kidney disease (CKD) 11/29/2015   next visit: get UA, Phos, Vit D, PTH, CMP    Chronic pain syndrome 06/23/2015   Records reviewed: pt has been on Fentanyl TD, Cymbalta, Flexeril   Degenerative disc disease, lumbar 06/23/2015   MRI 06/2014 most severe L4-5   Depression    Diabetes mellitus without complication (HCC)    Essential hypertension 06/02/2015   GERD (gastroesophageal reflux disease) 06/02/2015   Herniation of intervertebral disc between L4 and L5 06/23/2015   MRI 06/2014 moderate to severe R foraminal narrowing   History of vitamin D deficiency 06/02/2015   Hypertension    Levoscoliosis 06/23/2015   Mild, MRI 06/22/14   Migraine 06/02/2015   Osteoporosis 06/02/2015   Postmenopausal 08/31/2015   Retrolisthesis of vertebrae 06/23/2015   Gr1 L1 on L2 MRI 06/2014   Seasonal allergies 06/02/2015   Type 2 diabetes mellitus (HCC) 06/02/2015   Type 2 diabetes with nephropathy (HCC) 11/29/2015   DIABETES SCREENING/PREVENTIVE CARE: Updated 03/05/16  A1C past 3-6 mos: Yes, 03/05/16 6.6% controlled? Yes BP goal <140/90: Yes  LDL goal <70: no - 100 11/24/15 Eye exam annually: Yes , importance discussed with patient Foot exam:  08/31/15 Microalbuminuria:   not applicable, patient is on ACE/ARB Metformin: No - declines ACE/ARB: Yes  Antiplatelet if ASCVD Risk >10%: Recommended Statin: No    Past Surgical History:  Procedure Laterality Date   BUNIONECTOMY Left 1993   eye surgery     FOOT SURGERY  2008   FOOT SURGERY  2003   NECK SURGERY  1993   REPLACEMENT TOTAL KNEE Bilateral 1999   R in Jan, L in Apr   TONSILLECTOMY  1952   Family History   Problem Relation Age of Onset   Diabetes Sister    Heart Problems Sister        multiple bypass surgeries   Cancer Sister    Depression Brother    Heart Problems Brother        bad heart use oxygen   Coronary artery disease Mother    Migraines Mother    Pulmonary fibrosis Father    Pneumonia Father    Migraines Father    Breast cancer Maternal Aunt    Social History   Socioeconomic History   Marital status: Widowed    Spouse name: Not on file   Number of children: 2   Years of education: 64   Highest education level: GED or equivalent  Occupational History   Occupation:  office work    Comment: retired  Tobacco Use   Smoking status: Never   Smokeless tobacco: Never  Vaping Use   Vaping Use: Never used  Substance and Sexual Activity   Alcohol use: No    Alcohol/week: 0.0 standard drinks of alcohol   Drug use: No   Sexual activity: Not Currently  Other Topics Concern   Not on file  Social History Narrative   Lives at home w/ her son and his wife   Enjoys playing games on her phones and tablet.   Social Determinants of Health   Financial Resource Strain: Low Risk  (02/01/2022)   Overall Financial Resource Strain (CARDIA)    Difficulty of Paying Living Expenses: Not hard at all  Food Insecurity: No Food Insecurity (02/01/2022)   Hunger Vital Sign    Worried About Running Out of Food in the Last Year: Never true    Ran Out of Food in the Last Year: Never true  Transportation Needs: No Transportation Needs (02/01/2022)   PRAPARE - Hydrologist (Medical): No    Lack of Transportation (Non-Medical): No  Physical Activity: Inactive (02/01/2022)   Exercise Vital Sign    Days of Exercise per Week: 0 days    Minutes of Exercise per Session: 0 min  Stress: No Stress Concern Present (02/01/2022)   [place]    Feeling of Stress : Only a little  Social Connections: Socially  Isolated (02/01/2022)   Social Connection and Isolation Panel [NHANES]    Frequency of Communication with Friends and Family: Once a week    Frequency of Social Gatherings with Friends and Family: More than three times a week    Attends Religious Services: Never    Marine scientist or Organizations: No    Attends Archivist Meetings: Never    Marital Status: Widowed    Activities of Daily Living    02/01/2022    9:54 AM  In your present state of health, do you have any difficulty performing the following activities:  Hearing? 0  Vision? 0  Difficulty concentrating or making decisions? 1  Comment little memory loss  Walking or climbing stairs? 1  Comment does not climb stairs  Dressing or bathing? 0  Doing errands, shopping? 1  Comment she does not drive.  Preparing Food and eating ? N  Using the Toilet? N  In the past six months, have you accidently leaked urine? Y  Comment sometimes  Do you have problems with loss of bowel control? N  Managing your Medications? N  Managing your Finances? N  Housekeeping or managing your Housekeeping? N    Patient Education/ Literacy How often do you need to have someone help you when you read instructions, pamphlets, or other written materials from your doctor or pharmacy?: 1 - Never What is the last grade level you completed in school?: 12th grade  Exercise Current Exercise Habits: The patient does not participate in regular exercise at present, Exercise limited by: None identified  Diet Patient reports consuming 2 meals a day and 0 snack(s) a day Patient reports that her primary diet is: Regular Patient reports that she does have regular access to food.   Depression Screen    02/01/2022    9:49 AM 01/05/2022    3:27 PM 10/05/2021    3:39 PM 07/04/2021    2:33 PM 01/11/2021   11:12 AM 04/28/2019  4:09 PM 07/16/2018    1:19 PM  PHQ 2/9 Scores  PHQ - 2 Score 0 0 0 0 2 0   PHQ- 9 Score     3    Exception  Documentation       Patient refusal     Fall Risk    02/01/2022    9:49 AM 01/05/2022    3:27 PM 10/05/2021    3:39 PM 07/04/2021    2:33 PM 01/11/2021   11:11 AM  Fall Risk   Falls in the past year? 1 0 1 1 1   Number falls in past yr: 1 0 1 1 1   Injury with Fall? 0 0 0 1 1  Risk for fall due to : History of fall(s) No Fall Risks History of fall(s)  History of fall(s)  Follow up Falls evaluation completed;Education provided;Falls prevention discussed Falls evaluation completed Falls evaluation completed Falls evaluation completed;Falls prevention discussed Falls evaluation completed;Education provided;Falls prevention discussed     Objective:  Teresa Medina seemed alert and oriented and she participated appropriately during our telephone visit.  Blood Pressure Weight BMI  BP Readings from Last 3 Encounters:  01/05/22 120/79  10/05/21 117/77  07/04/21 130/89   Wt Readings from Last 3 Encounters:  01/05/22 194 lb (88 kg)  10/05/21 194 lb 6.4 oz (88.2 kg)  07/04/21 188 lb (85.3 kg)   BMI Readings from Last 1 Encounters:  01/05/22 36.66 kg/m    *Unable to obtain current vital signs, weight, and BMI due to telephone visit type  Hearing/Vision  Teresa Medina did not seem to have difficulty with hearing/understanding during the telephone conversation Reports that she has not had a formal eye exam by an eye care professional within the past year Reports that she has not had a formal hearing evaluation within the past year *Unable to fully assess hearing and vision during telephone visit type  Cognitive Function:    02/01/2022    9:58 AM 01/11/2021   11:27 AM 04/28/2019    4:16 PM  6CIT Screen  What Year? 0 points 0 points 0 points  What month? 0 points 0 points 0 points  What time? 0 points 0 points 0 points  Count back from 20 0 points 0 points 0 points  Months in reverse 0 points 0 points 0 points  Repeat phrase 4 points 0 points 0 points  Total Score 4 points 0 points 0 points    (Normal:0-7, Significant for Dysfunction: >8)  Normal Cognitive Function Screening: Yes   Immunization & Health Maintenance Record Immunization History  Administered Date(s) Administered   Fluad Quad(high Dose 65+) 05/19/2019   Influenza, High Dose Seasonal PF 07/16/2018   Influenza,inj,Quad PF,6+ Mos 09/05/2016   Influenza-Unspecified 04/28/2015   PPD Test 12/16/2017   Pneumococcal Conjugate-13 06/04/2014   Pneumococcal Polysaccharide-23 09/20/2010   Tdap 11/29/2015    Health Maintenance  Topic Date Due   OPHTHALMOLOGY EXAM  02/01/2022 (Originally 10/21/2020)   FOOT EXAM  02/02/2022 (Originally 08/30/2016)   COVID-19 Vaccine (1) 02/17/2022 (Originally 03/03/1946)   Zoster Vaccines- Shingrix (1 of 2) 05/04/2022 (Originally 09/03/1994)   HEMOGLOBIN A1C  07/08/2022   URINE MICROALBUMIN  01/06/2023   Pneumonia Vaccine 37+ Years old  Completed   DEXA SCAN  Completed   Hepatitis C Screening  Addressed   HPV VACCINES  Aged Out       Assessment  This is a routine wellness examination for Teresa Medina.  Health Maintenance: Due or Overdue There are no preventive care  reminders to display for this patient.   Teresa Medina does not need a referral for Community Assistance: Care Management:   no Social Work:    no Prescription Assistance:  no Nutrition/Diabetes Education:  no   Plan:  Personalized Goals  Goals Addressed             This Visit's Progress    Patient Stated       02/01/2022 AWV Goal: Exercise for General Health  Patient will verbalize understanding of the benefits of increased physical activity: Exercising regularly is important. It will improve your overall fitness, flexibility, and endurance. Regular exercise also will improve your overall health. It can help you control your weight, reduce stress, and improve your bone density. Over the next year, patient will increase physical activity as tolerated with a goal of at least 150 minutes of  moderate physical activity per week.  You can tell that you are exercising at a moderate intensity if your heart starts beating faster and you start breathing faster but can still hold a conversation. Moderate-intensity exercise ideas include: Walking 1 mile (1.6 km) in about 15 minutes Biking Hiking Golfing Dancing Water aerobics Patient will verbalize understanding of everyday activities that increase physical activity by providing examples like the following: Yard work, such as: Insurance underwriter Gardening Washing windows or floors Patient will be able to explain general safety guidelines for exercising:  Before you start a new exercise program, talk with your health care provider. Do not exercise so much that you hurt yourself, feel dizzy, or get very short of breath. Wear comfortable clothes and wear shoes with good support. Drink plenty of water while you exercise to prevent dehydration or heat stroke. Work out until your breathing and your heartbeat get faster.        Personalized Health Maintenance & Screening Recommendations  Bone densitometry screening Shingrix vaccine Eye exam  Foot exam  Lung Cancer Screening Recommended: no (Low Dose CT Chest recommended if Age 64-80 years, 30 pack-year currently smoking OR have quit w/in past 15 years) Hepatitis C Screening recommended: no HIV Screening recommended: no  Advanced Directives: Written information was not prepared per patient's request.  Referrals & Orders Orders Placed This Encounter  Procedures   DEXAScan    Follow-up Plan Follow-up with Teresa Butter, NP as planned Schedule your Shingrix vaccine at your pharmacy Foot exam can be done at your next office visit Schedule your eye exam. Bone density scan referral has been sent and they will call you to schedule. Medicare wellness visit in one year. Patient will access AVS on  mychart.   I have personally reviewed and noted the following in the patient's chart:   Medical and social history Use of alcohol, tobacco or illicit drugs  Current medications and supplements Functional ability and status Nutritional status Physical activity Advanced directives List of other physicians Hospitalizations, surgeries, and ER visits in previous 12 months Vitals Screenings to include cognitive, depression, and falls Referrals and appointments  In addition, I have reviewed and discussed with Teresa Medina certain preventive protocols, quality metrics, and best practice recommendations. A written personalized care plan for preventive services as well as general preventive health recommendations is available and can be mailed to the patient at her request.      Modesto Charon, RN-BSN  02/01/2022

## 2022-02-08 ENCOUNTER — Ambulatory Visit (INDEPENDENT_AMBULATORY_CARE_PROVIDER_SITE_OTHER): Payer: Medicare Other | Admitting: Pharmacist

## 2022-02-08 DIAGNOSIS — G894 Chronic pain syndrome: Secondary | ICD-10-CM

## 2022-02-08 DIAGNOSIS — I10 Essential (primary) hypertension: Secondary | ICD-10-CM

## 2022-02-08 DIAGNOSIS — E1121 Type 2 diabetes mellitus with diabetic nephropathy: Secondary | ICD-10-CM

## 2022-02-08 NOTE — Patient Instructions (Signed)
Visit Information  Thank you for taking time to visit with me today. Please don't hesitate to contact me if I can be of assistance to you before our next scheduled telephone appointment.  Following are the goals we discussed today:  Patient Goals/Self-Care Activities Over the next 180 days, patient will:  take medications as prescribed  Follow Up Plan: Telephone follow up appointment with care management team member scheduled for: 6 months  Please call the care guide team at [phone] if you need to cancel or reschedule your appointment.   Patient verbalizes understanding of instructions and care plan provided today and agrees to view in MyChart. Active MyChart status and patient understanding of how to access instructions and care plan via MyChart confirmed with patient.     Teresa Medina J Kieffer Blatz  

## 2022-02-08 NOTE — Progress Notes (Signed)
Chronic Care Management Pharmacy Note  02/08/2022 Name:  Teresa Medina MRN:  [id number] DOB:  December 05, 1945  Summary: addressed DM, HTN, chronic pain.  BG: usually under 100  Recommendations/Changes made from today's visit: optimal candidate for SGLT2i for diabetes control and renal protection, but patient declines at this time. No changes to medication.  Plan: f/u with pharmacist in 6-12 months  Subjective: Teresa Medina is an 76 y.o. year old female who is a primary patient of Christen Butter, NP.  The CCM team was consulted for assistance with disease management and care coordination needs.    Engaged with patient by telephone for follow up visit in response to provider referral for pharmacy case management and/or care coordination services.   Consent to Services:  The patient was given information about Chronic Care Management services, agreed to services, and gave verbal consent prior to initiation of services.  Please see initial visit note for detailed documentation.   Patient Care Team: Christen Butter, NP as PCP - General (Nurse Practitioner) Gabriel Carina, [facility] as Pharmacist (Pharmacist)  Recent office visits:  04/03/21 Sunnie Nielsen DO PCP- pt was seen for Type 2 Diabetes w/ neuropathy. Labs were ordered and pt started on Fentanyl 50 mcg/hr pt72. Follow up in 3 months.   01/11/21 Sunnie Nielsen DO PCP- pt was seen for post menopause. Dexascan was done and pt was advised to f/u as planned.   01/04/21 Sunnie Nielsen DO PCP VIDEO- pt was seen for chronic migraine w/o aura. Provider disc glyburide 1.25 mg. No f/u noted.   Recent consult visits:  None noted   Hospital visits:  None in previous 6 months  Objective:  Lab Results  Component Value Date   CREATININE 1.06 (H) 10/05/2021   CREATININE 1.13 (H) 09/06/2020   CREATININE 1.10 (H) 11/18/2019    Lab Results  Component Value Date   HGBA1C 5.8 01/05/2022   Last diabetic Eye exam:  Lab Results   Component Value Date/Time   HMDIABEYEEXA No Retinopathy 10/22/2019 12:16 PM    Last diabetic Foot exam: No results found for: "HMDIABFOOTEX"      Component Value Date/Time   CHOL 186 11/18/2019 1416   TRIG 107 11/18/2019 1416   HDL 53 11/18/2019 1416   CHOLHDL 3.5 11/18/2019 1416   VLDL 27 01/03/2017 1419   LDLCALC 112 (H) 11/18/2019 1416       Latest Ref Rng & Units 10/05/2021    4:00 PM 09/06/2020   12:28 PM 11/18/2019    2:16 PM  Hepatic Function  Total Protein 6.1 - 8.1 g/dL 6.7  6.5  6.9   AST 10 - 35 U/L 11  11  16    ALT 6 - 29 U/L 5  6  6    Total Bilirubin 0.2 - 1.2 mg/dL 0.6  0.5  0.5     Lab Results  Component Value Date/Time   TSH 1.37 09/06/2020 12:28 PM   TSH 0.88 11/18/2019 02:16 PM       Latest Ref Rng & Units 10/05/2021    4:00 PM 09/06/2020   12:28 PM 11/18/2019    2:16 PM  CBC  WBC 3.8 - 10.8 Thousand/uL 9.8  9.5  10.1   Hemoglobin 11.7 - 15.5 g/dL 09/08/2020  01/18/2020  33.8   Hematocrit 35.0 - 45.0 % 45.8  44.9  46.4   Platelets 140 - 400 Thousand/uL 210  222  223     Lab Results  Component Value Date/Time   VD25OH 65 11/18/2019  02:16 PM   VD25OH 41 01/03/2017 02:19 PM    Clinical ASCVD The 10-year ASCVD risk score (Arnett DK, et al., 2019) is: 39.5%   Values used to calculate the score:     Age: 22 years     Sex: Female     Is Non-Hispanic African American: No     Diabetic: Yes     Tobacco smoker: No     Systolic Blood Pressure: 120 mmHg     Is BP treated: Yes     HDL Cholesterol: 53 mg/dL     Total Cholesterol: 186 mg/dL     Social History   Tobacco Use  Smoking Status Never  Smokeless Tobacco Never   BP Readings from Last 3 Encounters:  01/05/22 120/79  10/05/21 117/77  07/04/21 130/89   Pulse Readings from Last 3 Encounters:  01/05/22 78  10/05/21 75  07/04/21 88   Wt Readings from Last 3 Encounters:  01/05/22 194 lb (88 kg)  10/05/21 194 lb 6.4 oz (88.2 kg)  07/04/21 188 lb (85.3 kg)    Assessment: Review of patient past  medical history, allergies, medications, health status, including review of consultants reports, laboratory and other test data, was performed as part of comprehensive evaluation and provision of chronic care management services.   SDOH:  (Social Determinants of Health) assessments and interventions performed:    CCM Care Plan  No Known Allergies  Medications Reviewed Today     Reviewed by Modesto Charon, RN (Registered Nurse) on 02/01/22 at 1002  Med List Status: <None>   Medication Order Taking? Sig Documenting Provider Last Dose Status Informant  AIMOVIG 70 MG/ML SOAJ [id number] Yes Inject 70 mg into the skin every 30 (thirty) days. Christen Butter, NP Taking Active   aspirin EC 81 MG tablet [id number] Yes Take 1 tablet (81 mg total) by mouth daily. Swallow whole. Christen Butter, NP Taking Active   Biotin 78295 MCG TABS [id number] Yes Take 10,000 mcg by mouth daily. [provider] Taking Active Self  Cholecalciferol (VITAMIN D-3) 1000 UNITS CAPS [id number] Yes Take 2,000 Units by mouth daily.  [provider] Taking Active Self  cyclobenzaprine (FLEXERIL) 10 MG tablet [id number] Yes Take 1 tablet (10 mg total) by mouth 3 (three) times daily. Christen Butter, NP Taking Active   DULoxetine (CYMBALTA) 30 MG capsule [id number] Yes Take 3 capsules (90 mg total) by mouth daily. Christen Butter, NP Taking Active   fentaNYL (DURAGESIC) 50 MCG/HR [id number] Yes Place 1 patch onto the skin every 3 (three) days. Christen Butter, NP Taking Active   fluticasone (FLONASE) 50 MCG/ACT nasal spray [id number] Yes Place 1 spray into both nostrils daily. Sunnie Nielsen, DO Taking Active Self  glucose blood (FREESTYLE LITE) test strip [id number] Yes Check fasting blood sugar every morning and 2 hours after the largest meal of the day Christen Butter, NP Taking Active   glyBURIDE (DIABETA) 1.25 MG tablet [id number] Yes Take 1/2 tablet by mouth daily Christen Butter, NP Taking Active   Homeopathic Products [facility] DRY EYE  RELIEF OP) [id number] Yes Apply 1 drop to eye 2 (two) times daily. [provider] Taking Active Self  metoprolol succinate (TOPROL-XL) 50 MG 24 hr tablet [id number] Yes Take 1 tablet (50 mg total) by mouth daily. TAKE WITH OR IMMEDIATELY FOLLOWING A MEAL. Christen Butter, NP Taking Active   montelukast (SINGULAIR) 10 MG tablet [id number] Yes Take 1 tablet (10 mg total) by mouth at bedtime. Christen Butter, NP Taking Active   RABEprazole (ACIPHEX) 20  MG tablet [id number] Yes Take 1 tablet (20 mg total) by mouth daily. Christen Butter, NP Taking Active   rizatriptan (MAXALT) 5 MG tablet [id number] Yes Take 1 tablet (5 mg total) by mouth as needed for migraine. May repeat in 2 hours if needed Christen Butter, NP Taking Active   topiramate (TOPAMAX) 50 MG tablet [id number] Yes Take 3 tablets (150 mg total) by mouth at bedtime. Christen Butter, NP Taking Active   triamcinolone (KENALOG) 0.1 % paste [id number] Yes  [provider] Taking Active             Patient Active Problem List   Diagnosis Date Noted   Closed fracture of distal end of humerus 12/19/2017   Type 2 diabetes with nephropathy (HCC) 11/29/2015   Chronic kidney disease (CKD) 11/29/2015   Chronic pain syndrome 06/23/2015   Levoscoliosis 06/23/2015   Retrolisthesis of vertebrae 06/23/2015   Degenerative disc disease, lumbar 06/23/2015   Herniation of intervertebral disc between L4 and L5 06/23/2015   Essential hypertension 06/02/2015   Migraine 06/02/2015   Seasonal allergies 06/02/2015   History of vitamin D deficiency 06/02/2015   Osteoporosis 06/02/2015   GERD (gastroesophageal reflux disease) 06/02/2015    Immunization History  Administered Date(s) Administered   Fluad Quad(high Dose 65+) 05/19/2019   Influenza, High Dose Seasonal PF 07/16/2018   Influenza,inj,Quad PF,6+ Mos 09/05/2016   Influenza-Unspecified 04/28/2015   PPD Test 12/16/2017   Pneumococcal Conjugate-13 06/04/2014   Pneumococcal Polysaccharide-23 09/20/2010    Tdap 11/29/2015    Conditions to be addressed/monitored: HTN, DMII, and chronic pain  There are no care plans that you recently modified to display for this patient.   Medication Assistance: None required.  Patient affirms current coverage meets needs.  Patient's preferred pharmacy is:  [facility] [place], [place] - [address] [place] [address] Phone: [phone] Fax: [phone]  [facility] - [place], [place] - [address] [place] [address] Phone: [phone] Fax: [phone]  Uses pill box? Yes Pt endorses 100% compliance  Follow Up:  Patient agrees to Care Plan and Follow-up.  Plan: Telephone follow up appointment with care management team member scheduled for:  6 months  Lynnda Shields, PharmD Clinical Pharmacist [facility] [phone]

## 2022-02-09 ENCOUNTER — Other Ambulatory Visit: Payer: Self-pay | Admitting: Medical-Surgical

## 2022-02-09 DIAGNOSIS — I10 Essential (primary) hypertension: Secondary | ICD-10-CM

## 2022-02-09 DIAGNOSIS — K1379 Other lesions of oral mucosa: Secondary | ICD-10-CM

## 2022-02-09 DIAGNOSIS — E1121 Type 2 diabetes mellitus with diabetic nephropathy: Secondary | ICD-10-CM

## 2022-02-12 ENCOUNTER — Ambulatory Visit: Payer: Medicare Other | Admitting: Medical-Surgical

## 2022-02-12 NOTE — Telephone Encounter (Signed)
Patient has been referred to pain management twice, once in March and once in April. After her negative drug test in March, she was advised that Pain Management will be responsible for managing this medication going forward. Prescription refills were provided to allow her time to make an appointment and get established. As it has been nearly four months, she will need to have any further refills come from pain management as previously discussed.

## 2022-02-14 MED ORDER — FENTANYL 50 MCG/HR TD PT72: 1.0000 | MEDICATED_PATCH | TRANSDERMAL | 0 refills | Status: DC

## 2022-02-14 MED ORDER — TRIAMCINOLONE ACETONIDE 0.1 % MT PSTE: PASTE | OROMUCOSAL | 0 refills | Status: AC

## 2022-02-14 NOTE — Telephone Encounter (Signed)
Diane states her first appointment will be 03/03/22 with pain management.   She is also requesting a refill on Triamcinolone Dental Paste. She states she no longer sees the dentist. She states she gets mouth ulcer because she bites her cheek in her sleep.

## 2022-02-28 ENCOUNTER — Ambulatory Visit (INDEPENDENT_AMBULATORY_CARE_PROVIDER_SITE_OTHER): Payer: Medicare Other

## 2022-02-28 DIAGNOSIS — Z780 Asymptomatic menopausal state: Secondary | ICD-10-CM

## 2022-02-28 DIAGNOSIS — Z0000 Encounter for general adult medical examination without abnormal findings: Secondary | ICD-10-CM

## 2022-02-28 DIAGNOSIS — M810 Age-related osteoporosis without current pathological fracture: Secondary | ICD-10-CM | POA: Diagnosis not present

## 2022-03-08 DIAGNOSIS — M4692 Unspecified inflammatory spondylopathy, cervical region: Secondary | ICD-10-CM | POA: Diagnosis not present

## 2022-03-08 DIAGNOSIS — M5136 Other intervertebral disc degeneration, lumbar region: Secondary | ICD-10-CM | POA: Diagnosis not present

## 2022-03-08 DIAGNOSIS — M5030 Other cervical disc degeneration, unspecified cervical region: Secondary | ICD-10-CM | POA: Diagnosis not present

## 2022-03-08 DIAGNOSIS — M47816 Spondylosis without myelopathy or radiculopathy, lumbar region: Secondary | ICD-10-CM | POA: Diagnosis not present

## 2022-03-08 DIAGNOSIS — Z79891 Long term (current) use of opiate analgesic: Secondary | ICD-10-CM | POA: Diagnosis not present

## 2022-03-08 DIAGNOSIS — M961 Postlaminectomy syndrome, not elsewhere classified: Secondary | ICD-10-CM | POA: Diagnosis not present

## 2022-03-08 DIAGNOSIS — G894 Chronic pain syndrome: Secondary | ICD-10-CM | POA: Diagnosis not present

## 2022-03-08 DIAGNOSIS — M461 Sacroiliitis, not elsewhere classified: Secondary | ICD-10-CM | POA: Diagnosis not present

## 2022-03-15 ENCOUNTER — Other Ambulatory Visit: Payer: Self-pay | Admitting: Medical-Surgical

## 2022-03-15 NOTE — Telephone Encounter (Signed)
This patient has been referred to pain management and instructed to establish with them many months ago.  No further refills of fentanyl patches from primary care.

## 2022-03-19 ENCOUNTER — Other Ambulatory Visit: Payer: Self-pay | Admitting: Medical-Surgical

## 2022-03-20 ENCOUNTER — Other Ambulatory Visit: Payer: Self-pay

## 2022-03-20 ENCOUNTER — Other Ambulatory Visit: Payer: Self-pay | Admitting: Medical-Surgical

## 2022-03-21 ENCOUNTER — Other Ambulatory Visit: Payer: Self-pay | Admitting: Medical-Surgical

## 2022-03-21 MED ORDER — FENTANYL 50 MCG/HR TD PT72: 1.0000 | MEDICATED_PATCH | TRANSDERMAL | 0 refills | Status: DC

## 2022-03-21 NOTE — Progress Notes (Unsigned)
Spoke with Dr. Oneal Grout with Pain management. There was some confusion about the referral and she was under the impression that the referral was for evaluation for potential changes in her regimen rather than assuming management of the Fentanyl prescription. After discussion, she has agreed to manage the Fentanyl but the patient will need to contact their office for a follow up appointment to sign the opiate agreement and review the policies of their practice. I have sent a refill of her patches to allow her time to schedule and attend an appointment with them. Please contact the patient and instruct her to call Dr. Migdalia Dk office to schedule an appointment for follow up for them to assume management of her pain medication prescription.  ___________________________________________ Thayer Ohm, DNP, APRN, FNP-BC Primary Care and Sports Medicine [facility] [place]

## 2022-03-21 NOTE — Telephone Encounter (Signed)
Last office visit 01/05/2022  Last filled 09/07/2021

## 2022-03-26 NOTE — Progress Notes (Signed)
Left a detailed vm msg for patient regarding provider's note. Patient informed to contact Dr. Migdalia Dk office for the management of her Fentanyl pain rx. Direct call back info provided to patient for additional inquiries.

## 2022-04-17 ENCOUNTER — Other Ambulatory Visit: Payer: Self-pay | Admitting: Medical-Surgical

## 2022-04-20 ENCOUNTER — Telehealth: Payer: Self-pay | Admitting: Medical-Surgical

## 2022-04-20 MED ORDER — FENTANYL 50 MCG/HR TD PT72: 1.0000 | MEDICATED_PATCH | TRANSDERMAL | 0 refills | Status: AC

## 2022-04-20 NOTE — Telephone Encounter (Signed)
Pt's son called and states that Pain management has not taken over his mother's care or meds and they have not prescribed anything for his mom. He states his mom has been on a pain patch for 20 years and she is due to have a new one put on today and is out of this medication and needs this called in ASAP so she does not have withdrawals. Pt states that her pain management facility we sent her to told her that the current meds our facility had her on is controlling her pain and told her to keep doing what she has been doing. He states that he feels like we are not communicating with the pain clinic we sent her to or we would know this and refill her meds.  Patients son is asking to Please call him back as soon as possible to advise.

## 2022-04-20 NOTE — Telephone Encounter (Signed)
Per epic documentation, a conversation was held between myself and Dr. Oneal Grout regarding the patient's pain management regimen and having her medication managed by pain management.  This conversation occurred on 03/21/2022 at 12:25 PM.  See progress note in the system.  A telephone note was sent to clinical staff to contact the patient and instruct her to call Dr. Migdalia Dk office to schedule an appointment for them to assume management of her pain medication prescription.  A detailed voicemail message was left that day regarding the specific instructions to contact Dr. Migdalia Dk office for management of the fentanyl pain prescriptions.  Our direct callback information was provided in case they had questions.  We have received no contact from the patient or her son since this voicemail was left and we do not have open lines of communication with Dr. Migdalia Dk office as they are not part of our [place] link system.  As discussed with the patient and her son, this medication is not something that we will be comfortable managing here in our practice and she will need to see pain management to have it continued.  I have sent in 3 patches to get her through the next week.  He or she will need to contact Dr. Migdalia Dk office to schedule an appointment for them to assume care of of her fentanyl prescription.  Once this has been completed, they will need to call back to our office to notify us of the date and time that this is scheduled.  Patches will be provided to get her to her pain management appointment within reason.  After that, there will be no further fentanyl patches prescribed by providers here in our office.  They are welcome to decline to see Dr. Oneal Grout for pain management if preferred but they will need to secure pain management services on their own or seek a PCP who is willing to continue the Fentanyl patches.

## 2022-04-24 DIAGNOSIS — M461 Sacroiliitis, not elsewhere classified: Secondary | ICD-10-CM | POA: Diagnosis not present

## 2022-04-24 DIAGNOSIS — M47816 Spondylosis without myelopathy or radiculopathy, lumbar region: Secondary | ICD-10-CM | POA: Diagnosis not present

## 2022-04-24 DIAGNOSIS — G894 Chronic pain syndrome: Secondary | ICD-10-CM | POA: Diagnosis not present

## 2022-04-24 DIAGNOSIS — M961 Postlaminectomy syndrome, not elsewhere classified: Secondary | ICD-10-CM | POA: Diagnosis not present

## 2022-04-24 DIAGNOSIS — M5030 Other cervical disc degeneration, unspecified cervical region: Secondary | ICD-10-CM | POA: Diagnosis not present

## 2022-04-24 DIAGNOSIS — Z79891 Long term (current) use of opiate analgesic: Secondary | ICD-10-CM | POA: Diagnosis not present

## 2022-04-24 DIAGNOSIS — M4692 Unspecified inflammatory spondylopathy, cervical region: Secondary | ICD-10-CM | POA: Diagnosis not present

## 2022-04-24 DIAGNOSIS — M5136 Other intervertebral disc degeneration, lumbar region: Secondary | ICD-10-CM | POA: Diagnosis not present

## 2022-05-10 DIAGNOSIS — M47818 Spondylosis without myelopathy or radiculopathy, sacral and sacrococcygeal region: Secondary | ICD-10-CM | POA: Diagnosis not present

## 2022-05-10 DIAGNOSIS — M461 Sacroiliitis, not elsewhere classified: Secondary | ICD-10-CM | POA: Diagnosis not present

## 2022-05-16 ENCOUNTER — Encounter: Payer: Self-pay | Admitting: Family Medicine

## 2022-06-17 ENCOUNTER — Other Ambulatory Visit: Payer: Self-pay | Admitting: Medical-Surgical

## 2022-06-25 DIAGNOSIS — M6089 Other myositis, multiple sites: Secondary | ICD-10-CM | POA: Diagnosis not present

## 2022-06-25 DIAGNOSIS — M5136 Other intervertebral disc degeneration, lumbar region: Secondary | ICD-10-CM | POA: Diagnosis not present

## 2022-06-25 DIAGNOSIS — M47816 Spondylosis without myelopathy or radiculopathy, lumbar region: Secondary | ICD-10-CM | POA: Diagnosis not present

## 2022-06-25 DIAGNOSIS — G894 Chronic pain syndrome: Secondary | ICD-10-CM | POA: Diagnosis not present

## 2022-06-25 DIAGNOSIS — Z79891 Long term (current) use of opiate analgesic: Secondary | ICD-10-CM | POA: Diagnosis not present

## 2022-06-25 DIAGNOSIS — M4692 Unspecified inflammatory spondylopathy, cervical region: Secondary | ICD-10-CM | POA: Diagnosis not present

## 2022-06-25 DIAGNOSIS — M461 Sacroiliitis, not elsewhere classified: Secondary | ICD-10-CM | POA: Diagnosis not present

## 2022-06-25 DIAGNOSIS — M961 Postlaminectomy syndrome, not elsewhere classified: Secondary | ICD-10-CM | POA: Diagnosis not present

## 2022-06-25 DIAGNOSIS — M5030 Other cervical disc degeneration, unspecified cervical region: Secondary | ICD-10-CM | POA: Diagnosis not present

## 2022-07-03 DIAGNOSIS — M7918 Myalgia, other site: Secondary | ICD-10-CM | POA: Diagnosis not present

## 2022-07-03 DIAGNOSIS — M6089 Other myositis, multiple sites: Secondary | ICD-10-CM | POA: Diagnosis not present

## 2022-07-03 DIAGNOSIS — M791 Myalgia, unspecified site: Secondary | ICD-10-CM | POA: Diagnosis not present

## 2022-07-12 ENCOUNTER — Ambulatory Visit: Payer: Medicare Other | Admitting: Medical-Surgical

## 2022-07-24 ENCOUNTER — Telehealth: Payer: Self-pay

## 2022-07-24 NOTE — Telephone Encounter (Signed)
Initiated Prior authorization for: Aimovig 70MG /ML auto-injectors Via: Covermymeds Case/Key: [id number] Status: approved  as of 07/24/22 Reason:Authorization Expiration Date: 07/20/2023 Notified Pt via: Mychart

## 2022-08-07 ENCOUNTER — Other Ambulatory Visit: Payer: Self-pay | Admitting: Medical-Surgical

## 2022-08-07 MED ORDER — RIZATRIPTAN BENZOATE 5 MG PO TABS: ORAL_TABLET | ORAL | 0 refills | Status: AC

## 2022-08-08 ENCOUNTER — Other Ambulatory Visit: Payer: Self-pay

## 2022-08-08 ENCOUNTER — Other Ambulatory Visit (HOSPITAL_BASED_OUTPATIENT_CLINIC_OR_DEPARTMENT_OTHER): Payer: Self-pay

## 2022-08-09 ENCOUNTER — Other Ambulatory Visit (HOSPITAL_BASED_OUTPATIENT_CLINIC_OR_DEPARTMENT_OTHER): Payer: Self-pay

## 2022-08-09 MED ORDER — FENTANYL 50 MCG/HR TD PT72
1.0000 | MEDICATED_PATCH | TRANSDERMAL | 0 refills | Status: AC
  Filled 2022-08-09: qty 10, 30d supply, fill #0

## 2022-08-10 ENCOUNTER — Telehealth: Payer: Self-pay

## 2022-08-10 NOTE — Telephone Encounter (Signed)
Initiated Prior authorization [id number] Sodium 20MG  dr tablets Via: Covermymeds Case/Key:[facility] Status: approved  as of 08/10/22 Reason:The authorization is valid from 07/11/2022 through 08/10/2023. Notified Pt via: Mychart

## 2022-08-22 ENCOUNTER — Other Ambulatory Visit: Payer: Self-pay | Admitting: Medical-Surgical

## 2022-08-23 ENCOUNTER — Ambulatory Visit (INDEPENDENT_AMBULATORY_CARE_PROVIDER_SITE_OTHER): Payer: Medicare Other | Admitting: Pharmacist

## 2022-08-23 DIAGNOSIS — E1121 Type 2 diabetes mellitus with diabetic nephropathy: Secondary | ICD-10-CM

## 2022-08-23 DIAGNOSIS — I10 Essential (primary) hypertension: Secondary | ICD-10-CM

## 2022-08-23 DIAGNOSIS — G894 Chronic pain syndrome: Secondary | ICD-10-CM

## 2022-08-23 NOTE — Progress Notes (Signed)
Chronic Care Management Pharmacy Note  08/23/2022 Name:  Teresa Medina MRN:  [id number] DOB:  04/08/1946  Summary: addressed DM, HTN, chronic pain.  BG: usually 70-120s   Recommendations/Changes made from today's visit: no changes, continue current regimen.  Plan: f/u with pharmacist as needed  Subjective: Teresa Medina is an 77 y.o. year old female who is a primary patient of Samuel Bouche, NP.  The CCM team was consulted for assistance with disease management and care coordination needs.    Engaged with patient by telephone for follow up visit in response to provider referral for pharmacy case management and/or care coordination services.   Consent to Services:  The patient was given information about Chronic Care Management services, agreed to services, and gave verbal consent prior to initiation of services.  Please see initial visit note for detailed documentation.   Patient Care Team: Samuel Bouche, NP as PCP - General (Nurse Practitioner) Darius Bump, [facility] as Pharmacist (Pharmacist)  Recent office visits:  04/03/21 Emeterio Reeve DO PCP- pt was seen for Type 2 Diabetes w/ neuropathy. Labs were ordered and pt started on Fentanyl 50 mcg/hr pt72. Follow up in 3 months.   01/11/21 Emeterio Reeve DO PCP- pt was seen for post menopause. Dexascan was done and pt was advised to f/u as planned.   01/04/21 Emeterio Reeve DO PCP VIDEO- pt was seen for chronic migraine w/o aura. Provider disc glyburide 1.25 mg. No f/u noted.   Recent consult visits:  None noted   Hospital visits:  None in previous 6 months  Objective:  Lab Results  Component Value Date   CREATININE 1.06 (H) 10/05/2021   CREATININE 1.13 (H) 09/06/2020   CREATININE 1.10 (H) 11/18/2019    Lab Results  Component Value Date   HGBA1C 5.8 01/05/2022   Last diabetic Eye exam:  Lab Results  Component Value Date/Time   HMDIABEYEEXA No Retinopathy 10/22/2019 12:16 PM    Last diabetic Foot exam:  No results found for: "HMDIABFOOTEX"      Component Value Date/Time   CHOL 186 11/18/2019 1416   TRIG 107 11/18/2019 1416   HDL 53 11/18/2019 1416   CHOLHDL 3.5 11/18/2019 1416   VLDL 27 01/03/2017 1419   LDLCALC 112 (H) 11/18/2019 1416       Latest Ref Rng & Units 10/05/2021    4:00 PM 09/06/2020   12:28 PM 11/18/2019    2:16 PM  Hepatic Function  Total Protein 6.1 - 8.1 g/dL 6.7  6.5  6.9   AST 10 - 35 U/L 11  11  16    ALT 6 - 29 U/L 5  6  6    Total Bilirubin 0.2 - 1.2 mg/dL 0.6  0.5  0.5     Lab Results  Component Value Date/Time   TSH 1.37 09/06/2020 12:28 PM   TSH 0.88 11/18/2019 02:16 PM       Latest Ref Rng & Units 10/05/2021    4:00 PM 09/06/2020   12:28 PM 11/18/2019    2:16 PM  CBC  WBC 3.8 - 10.8 Thousand/uL 9.8  9.5  10.1   Hemoglobin 11.7 - 15.5 g/dL 14.5  14.4  14.7   Hematocrit 35.0 - 45.0 % 45.8  44.9  46.4   Platelets 140 - 400 Thousand/uL 210  222  223     Lab Results  Component Value Date/Time   VD25OH 65 11/18/2019 02:16 PM   VD25OH 41 01/03/2017 02:19 PM    Clinical ASCVD The 10-year  ASCVD risk score (Arnett DK, et al., 2019) is: 39.5%   Values used to calculate the score:     Age: 69 years     Sex: Female     Is Non-Hispanic African American: No     Diabetic: Yes     Tobacco smoker: No     Systolic Blood Pressure: 701 mmHg     Is BP treated: Yes     HDL Cholesterol: 53 mg/dL     Total Cholesterol: 186 mg/dL     Social History   Tobacco Use  Smoking Status Never  Smokeless Tobacco Never   BP Readings from Last 3 Encounters:  01/05/22 120/79  10/05/21 117/77  07/04/21 130/89   Pulse Readings from Last 3 Encounters:  01/05/22 78  10/05/21 75  07/04/21 88   Wt Readings from Last 3 Encounters:  01/05/22 194 lb (88 kg)  10/05/21 194 lb 6.4 oz (88.2 kg)  07/04/21 188 lb (85.3 kg)    Assessment: Review of patient past medical history, allergies, medications, health status, including review of consultants reports, laboratory and  other test data, was performed as part of comprehensive evaluation and provision of chronic care management services.   SDOH:  (Social Determinants of Health) assessments and interventions performed:  SDOH Interventions    Flowsheet Row Office Visit from 01/11/2021 in [place] Interventions Intervention Not Indicated  Housing Interventions Intervention Not Indicated  Transportation Interventions Intervention Not Indicated  Depression Interventions/Treatment  PHQ2-9 Score <4 Follow-up Not Indicated  Financial Strain Interventions Intervention Not Indicated  Physical Activity Interventions Intervention Not Indicated  Stress Interventions Intervention Not Indicated  Social Connections Interventions Intervention Not Indicated       CCM Care Plan  No Known Allergies  Medications Reviewed Today     Reviewed by Darius Bump, [place] (Pharmacist) on 02/08/22 at 1408  Med List Status: <None>   Medication Order Taking? Sig Documenting Provider Last Dose Status Informant  AIMOVIG 70 MG/ML SOAJ [id number] Yes Inject 70 mg into the skin every 30 (thirty) days. Samuel Bouche, NP Taking Active   aspirin EC 81 MG tablet [id number] Yes Take 1 tablet (81 mg total) by mouth daily. Swallow whole. Samuel Bouche, NP Taking Active   Biotin 10000 MCG TABS [id number] Yes Take 10,000 mcg by mouth daily. [provider] Taking Active Self  Cholecalciferol (VITAMIN D-3) 1000 UNITS CAPS [id number] Yes Take 2,000 Units by mouth daily.  [provider] Taking Active Self  cyclobenzaprine (FLEXERIL) 10 MG tablet [id number] Yes Take 1 tablet (10 mg total) by mouth 3 (three) times daily. Samuel Bouche, NP Taking Active   DULoxetine (CYMBALTA) 30 MG capsule [id number] Yes Take 3 capsules (90 mg total) by mouth daily. Samuel Bouche, NP Taking Active   fentaNYL ([place]) 50 MCG/HR [id number] Yes Place 1 patch onto the skin every 3 (three)  days. Samuel Bouche, NP Taking Active   fluticasone (FLONASE) 50 MCG/ACT nasal spray [id number] Yes Place 1 spray into both nostrils daily. Emeterio Reeve, DO Taking Active Self  glucose blood (FREESTYLE LITE) test strip [id number] Yes Check fasting blood sugar every morning and 2 hours after the largest meal of the day Samuel Bouche, NP Taking Active   glyBURIDE (DIABETA) 1.25 MG tablet [id number] Yes Take 1/2 tablet by mouth daily Samuel Bouche, NP Taking Active   Homeopathic Products [facility] DRY EYE RELIEF OP) [id number] Yes Apply 1 drop to eye 2 (two) times daily.  [provider] Taking Active Self  metoprolol succinate (TOPROL-XL) 50 MG 24 hr tablet [id number] Yes Take 1 tablet (50 mg total) by mouth daily. TAKE WITH OR IMMEDIATELY FOLLOWING A MEAL. Christen Butter, NP Taking Active   montelukast (SINGULAIR) 10 MG tablet [id number] Yes Take 1 tablet (10 mg total) by mouth at bedtime. Christen Butter, NP Taking Active   RABEprazole (ACIPHEX) 20 MG tablet [id number] Yes Take 1 tablet (20 mg total) by mouth daily. Christen Butter, NP Taking Active   rizatriptan (MAXALT) 5 MG tablet [id number] Yes Take 1 tablet (5 mg total) by mouth as needed for migraine. May repeat in 2 hours if needed Christen Butter, NP Taking Active   topiramate (TOPAMAX) 50 MG tablet [id number] Yes Take 3 tablets (150 mg total) by mouth at bedtime. Christen Butter, NP Taking Active   triamcinolone (KENALOG) 0.1 % paste [id number] Yes  [provider] Taking Active             Patient Active Problem List   Diagnosis Date Noted   Closed fracture of distal end of humerus 12/19/2017   Type 2 diabetes with nephropathy (HCC) 11/29/2015   Chronic kidney disease (CKD) 11/29/2015   Chronic pain syndrome 06/23/2015   Levoscoliosis 06/23/2015   Retrolisthesis of vertebrae 06/23/2015   Degenerative disc disease, lumbar 06/23/2015   Herniation of intervertebral disc between L4 and L5 06/23/2015   Essential hypertension 06/02/2015    Migraine 06/02/2015   Seasonal allergies 06/02/2015   History of vitamin D deficiency 06/02/2015   Osteoporosis 06/02/2015   GERD (gastroesophageal reflux disease) 06/02/2015    Immunization History  Administered Date(s) Administered   Fluad Quad(high Dose 65+) 05/19/2019   Influenza, High Dose Seasonal PF 07/16/2018   Influenza,inj,Quad PF,6+ Mos 09/05/2016   Influenza-Unspecified 04/28/2015   PPD Test 12/16/2017   Pneumococcal Conjugate-13 06/04/2014   Pneumococcal Polysaccharide-23 09/20/2010   Tdap 11/29/2015    Conditions to be addressed/monitored: HTN, DMII, and chronic pain  There are no care plans that you recently modified to display for this patient.   Medication Assistance: None required.  Patient affirms current coverage meets needs.  Patient's preferred pharmacy is:  [facility] [place], [place] - [address] [place] [address] Phone: [phone] Fax: [phone]  [facility] - [place], [place] - [address] [place] [address] Phone: [phone] Fax: [phone]  Uses pill box? Yes Pt endorses 100% compliance  Follow Up:  Patient agrees to Care Plan and Follow-up.  Plan: The patient has been provided with contact information for the care management team and has been advised to call with any health related questions or concerns.   Lynnda Shields, PharmD Clinical Pharmacist [facility] Primary Care At [facility] [phone]

## 2022-09-03 ENCOUNTER — Other Ambulatory Visit (HOSPITAL_BASED_OUTPATIENT_CLINIC_OR_DEPARTMENT_OTHER): Payer: Self-pay

## 2022-09-03 MED ORDER — FENTANYL 50 MCG/HR TD PT72
1.0000 | MEDICATED_PATCH | TRANSDERMAL | 0 refills | Status: AC
  Filled 2022-09-07: qty 10, 30d supply, fill #0

## 2022-09-03 MED ORDER — FENTANYL 50 MCG/HR TD PT72
1.0000 | MEDICATED_PATCH | TRANSDERMAL | 0 refills | Status: AC
  Filled 2022-10-11: qty 10, 30d supply, fill #0

## 2022-09-07 ENCOUNTER — Other Ambulatory Visit (HOSPITAL_BASED_OUTPATIENT_CLINIC_OR_DEPARTMENT_OTHER): Payer: Self-pay

## 2022-09-07 ENCOUNTER — Other Ambulatory Visit: Payer: Self-pay

## 2022-09-16 ENCOUNTER — Other Ambulatory Visit: Payer: Self-pay | Admitting: Medical-Surgical

## 2022-10-11 ENCOUNTER — Other Ambulatory Visit (HOSPITAL_BASED_OUTPATIENT_CLINIC_OR_DEPARTMENT_OTHER): Payer: Self-pay

## 2022-10-22 ENCOUNTER — Other Ambulatory Visit (HOSPITAL_BASED_OUTPATIENT_CLINIC_OR_DEPARTMENT_OTHER): Payer: Self-pay

## 2022-10-22 MED ORDER — FENTANYL 50 MCG/HR TD PT72
MEDICATED_PATCH | TRANSDERMAL | 0 refills | Status: AC
  Filled 2022-11-07: qty 10, 30d supply, fill #0

## 2022-10-22 MED ORDER — FENTANYL 50 MCG/HR TD PT72
1.0000 | MEDICATED_PATCH | TRANSDERMAL | 0 refills | Status: AC
  Filled 2022-12-11: qty 10, 30d supply, fill #0

## 2022-10-22 MED ORDER — NALOXONE HCL 4 MG/0.1ML NA LIQD
NASAL | 0 refills | Status: AC
  Filled 2022-11-07: qty 2, 30d supply, fill #0

## 2022-10-23 ENCOUNTER — Other Ambulatory Visit: Payer: Self-pay | Admitting: Medical-Surgical

## 2022-11-07 ENCOUNTER — Other Ambulatory Visit: Payer: Self-pay

## 2022-11-07 ENCOUNTER — Other Ambulatory Visit (HOSPITAL_BASED_OUTPATIENT_CLINIC_OR_DEPARTMENT_OTHER): Payer: Self-pay

## 2022-11-08 ENCOUNTER — Other Ambulatory Visit (HOSPITAL_BASED_OUTPATIENT_CLINIC_OR_DEPARTMENT_OTHER): Payer: Self-pay

## 2022-12-10 ENCOUNTER — Other Ambulatory Visit: Payer: Self-pay | Admitting: Medical-Surgical

## 2022-12-11 ENCOUNTER — Other Ambulatory Visit (HOSPITAL_BASED_OUTPATIENT_CLINIC_OR_DEPARTMENT_OTHER): Payer: Self-pay

## 2022-12-11 ENCOUNTER — Other Ambulatory Visit: Payer: Self-pay

## 2022-12-26 ENCOUNTER — Other Ambulatory Visit (HOSPITAL_BASED_OUTPATIENT_CLINIC_OR_DEPARTMENT_OTHER): Payer: Self-pay

## 2022-12-26 MED ORDER — FENTANYL 50 MCG/HR TD PT72
1.0000 | MEDICATED_PATCH | TRANSDERMAL | 0 refills | Status: AC
  Filled 2023-01-14: qty 10, 30d supply, fill #0

## 2022-12-26 MED ORDER — FENTANYL 50 MCG/HR TD PT72
1.0000 | MEDICATED_PATCH | TRANSDERMAL | 0 refills | Status: AC | PRN
  Filled 2023-02-12: qty 10, 30d supply, fill #0

## 2023-01-14 ENCOUNTER — Other Ambulatory Visit (HOSPITAL_BASED_OUTPATIENT_CLINIC_OR_DEPARTMENT_OTHER): Payer: Self-pay

## 2023-02-12 ENCOUNTER — Other Ambulatory Visit (HOSPITAL_BASED_OUTPATIENT_CLINIC_OR_DEPARTMENT_OTHER): Payer: Self-pay

## 2023-02-13 ENCOUNTER — Other Ambulatory Visit (HOSPITAL_BASED_OUTPATIENT_CLINIC_OR_DEPARTMENT_OTHER): Payer: Self-pay

## 2023-03-11 ENCOUNTER — Other Ambulatory Visit (HOSPITAL_BASED_OUTPATIENT_CLINIC_OR_DEPARTMENT_OTHER): Payer: Self-pay

## 2023-03-11 MED ORDER — FENTANYL 50 MCG/HR TD PT72
1.0000 | MEDICATED_PATCH | TRANSDERMAL | 0 refills | Status: AC
  Filled 2023-04-17: qty 10, 30d supply, fill #0

## 2023-03-11 MED ORDER — FENTANYL 50 MCG/HR TD PT72
1.0000 | MEDICATED_PATCH | TRANSDERMAL | 0 refills | Status: AC
  Filled 2023-03-13: qty 10, 30d supply, fill #0

## 2023-03-13 ENCOUNTER — Other Ambulatory Visit (HOSPITAL_BASED_OUTPATIENT_CLINIC_OR_DEPARTMENT_OTHER): Payer: Self-pay

## 2023-04-17 ENCOUNTER — Other Ambulatory Visit (HOSPITAL_BASED_OUTPATIENT_CLINIC_OR_DEPARTMENT_OTHER): Payer: Self-pay

## 2023-04-18 ENCOUNTER — Other Ambulatory Visit (HOSPITAL_BASED_OUTPATIENT_CLINIC_OR_DEPARTMENT_OTHER): Payer: Self-pay

## 2023-04-19 ENCOUNTER — Other Ambulatory Visit (HOSPITAL_BASED_OUTPATIENT_CLINIC_OR_DEPARTMENT_OTHER): Payer: Self-pay

## 2023-05-08 ENCOUNTER — Other Ambulatory Visit (HOSPITAL_BASED_OUTPATIENT_CLINIC_OR_DEPARTMENT_OTHER): Payer: Self-pay

## 2023-05-08 MED ORDER — FENTANYL 50 MCG/HR TD PT72
1.0000 | MEDICATED_PATCH | TRANSDERMAL | 0 refills | Status: AC
  Filled 2023-05-15: qty 10, 30d supply, fill #0

## 2023-05-08 MED ORDER — FENTANYL 50 MCG/HR TD PT72
1.0000 | MEDICATED_PATCH | TRANSDERMAL | 0 refills | Status: AC
  Filled 2023-06-18: qty 10, 30d supply, fill #0

## 2023-05-15 ENCOUNTER — Other Ambulatory Visit (HOSPITAL_BASED_OUTPATIENT_CLINIC_OR_DEPARTMENT_OTHER): Payer: Self-pay

## 2023-06-18 ENCOUNTER — Other Ambulatory Visit (HOSPITAL_BASED_OUTPATIENT_CLINIC_OR_DEPARTMENT_OTHER): Payer: Self-pay

## 2023-07-02 ENCOUNTER — Other Ambulatory Visit (HOSPITAL_BASED_OUTPATIENT_CLINIC_OR_DEPARTMENT_OTHER): Payer: Self-pay

## 2023-07-02 MED ORDER — FENTANYL 50 MCG/HR TD PT72
1.0000 | MEDICATED_PATCH | TRANSDERMAL | 0 refills | Status: AC
  Filled 2023-07-17: qty 10, 30d supply, fill #0

## 2023-07-02 MED ORDER — NALOXONE HCL 4 MG/0.1ML NA LIQD
NASAL | 0 refills | Status: AC
  Filled 2023-07-02: qty 2, 1d supply, fill #0

## 2023-07-02 MED ORDER — FENTANYL 50 MCG/HR TD PT72
1.0000 | MEDICATED_PATCH | TRANSDERMAL | 0 refills | Status: AC
  Filled 2023-08-21: qty 10, 30d supply, fill #0

## 2023-07-15 ENCOUNTER — Other Ambulatory Visit (HOSPITAL_BASED_OUTPATIENT_CLINIC_OR_DEPARTMENT_OTHER): Payer: Self-pay

## 2023-07-17 ENCOUNTER — Other Ambulatory Visit (HOSPITAL_BASED_OUTPATIENT_CLINIC_OR_DEPARTMENT_OTHER): Payer: Self-pay

## 2023-08-21 ENCOUNTER — Other Ambulatory Visit (HOSPITAL_BASED_OUTPATIENT_CLINIC_OR_DEPARTMENT_OTHER): Payer: Self-pay

## 2023-09-02 ENCOUNTER — Other Ambulatory Visit (HOSPITAL_BASED_OUTPATIENT_CLINIC_OR_DEPARTMENT_OTHER): Payer: Self-pay

## 2023-09-02 MED ORDER — FENTANYL 50 MCG/HR TD PT72
1.0000 | MEDICATED_PATCH | TRANSDERMAL | 0 refills | Status: AC
  Filled 2023-09-24: qty 10, 30d supply, fill #0

## 2023-09-02 MED ORDER — FENTANYL 50 MCG/HR TD PT72
1.0000 | MEDICATED_PATCH | TRANSDERMAL | 0 refills | Status: AC
  Filled 2023-10-28: qty 10, 30d supply, fill #0

## 2023-09-02 MED ORDER — NALOXONE HCL 4 MG/0.1ML NA LIQD
NASAL | 0 refills | Status: AC
  Filled 2023-09-02: qty 2, 1d supply, fill #0

## 2023-09-24 ENCOUNTER — Other Ambulatory Visit (HOSPITAL_BASED_OUTPATIENT_CLINIC_OR_DEPARTMENT_OTHER): Payer: Self-pay

## 2023-10-28 ENCOUNTER — Other Ambulatory Visit (HOSPITAL_BASED_OUTPATIENT_CLINIC_OR_DEPARTMENT_OTHER): Payer: Self-pay

## 2023-11-11 ENCOUNTER — Other Ambulatory Visit (HOSPITAL_BASED_OUTPATIENT_CLINIC_OR_DEPARTMENT_OTHER): Payer: Self-pay

## 2023-11-11 MED ORDER — FENTANYL 50 MCG/HR TD PT72
MEDICATED_PATCH | TRANSDERMAL | 0 refills | Status: AC
  Filled 2024-01-07: qty 10, 30d supply, fill #0

## 2023-11-11 MED ORDER — FENTANYL 50 MCG/HR TD PT72
MEDICATED_PATCH | TRANSDERMAL | 0 refills | Status: AC
  Filled 2023-12-02: qty 10, 30d supply, fill #0

## 2023-12-02 ENCOUNTER — Other Ambulatory Visit (HOSPITAL_BASED_OUTPATIENT_CLINIC_OR_DEPARTMENT_OTHER): Payer: Self-pay

## 2024-01-07 ENCOUNTER — Other Ambulatory Visit (HOSPITAL_BASED_OUTPATIENT_CLINIC_OR_DEPARTMENT_OTHER): Payer: Self-pay

## 2024-01-09 ENCOUNTER — Other Ambulatory Visit (HOSPITAL_BASED_OUTPATIENT_CLINIC_OR_DEPARTMENT_OTHER): Payer: Self-pay

## 2024-01-09 MED ORDER — FENTANYL 50 MCG/HR TD PT72
1.0000 | MEDICATED_PATCH | TRANSDERMAL | 0 refills | Status: AC
  Filled 2024-02-10: qty 10, 30d supply, fill #0

## 2024-01-09 MED ORDER — NALOXONE HCL 4 MG/0.1ML NA LIQD
4.0000 mg | Freq: Once | NASAL | 0 refills | Status: AC
  Filled 2024-01-09: qty 2, 2d supply, fill #0

## 2024-01-09 MED ORDER — FENTANYL 50 MCG/HR TD PT72
1.0000 | MEDICATED_PATCH | TRANSDERMAL | 0 refills | Status: AC
  Filled 2024-03-06 – 2024-03-09 (×2): qty 10, 30d supply, fill #0

## 2024-01-20 ENCOUNTER — Other Ambulatory Visit (HOSPITAL_BASED_OUTPATIENT_CLINIC_OR_DEPARTMENT_OTHER): Payer: Self-pay

## 2024-02-10 ENCOUNTER — Other Ambulatory Visit (HOSPITAL_BASED_OUTPATIENT_CLINIC_OR_DEPARTMENT_OTHER): Payer: Self-pay

## 2024-03-05 ENCOUNTER — Other Ambulatory Visit (HOSPITAL_BASED_OUTPATIENT_CLINIC_OR_DEPARTMENT_OTHER): Payer: Self-pay

## 2024-03-05 MED ORDER — FENTANYL 50 MCG/HR TD PT72
1.0000 | MEDICATED_PATCH | TRANSDERMAL | 0 refills | Status: AC
  Filled 2024-04-08: qty 10, 30d supply, fill #0

## 2024-03-05 MED ORDER — FENTANYL 50 MCG/HR TD PT72
1.0000 | MEDICATED_PATCH | TRANSDERMAL | 0 refills | Status: AC
  Filled 2024-05-11: qty 10, 30d supply, fill #0

## 2024-03-06 ENCOUNTER — Other Ambulatory Visit (HOSPITAL_BASED_OUTPATIENT_CLINIC_OR_DEPARTMENT_OTHER): Payer: Self-pay

## 2024-03-06 ENCOUNTER — Other Ambulatory Visit (HOSPITAL_COMMUNITY): Payer: Self-pay

## 2024-03-09 ENCOUNTER — Other Ambulatory Visit (HOSPITAL_BASED_OUTPATIENT_CLINIC_OR_DEPARTMENT_OTHER): Payer: Self-pay

## 2024-03-10 ENCOUNTER — Other Ambulatory Visit (HOSPITAL_BASED_OUTPATIENT_CLINIC_OR_DEPARTMENT_OTHER): Payer: Self-pay

## 2024-03-11 ENCOUNTER — Other Ambulatory Visit (HOSPITAL_BASED_OUTPATIENT_CLINIC_OR_DEPARTMENT_OTHER): Payer: Self-pay

## 2024-04-06 ENCOUNTER — Other Ambulatory Visit (HOSPITAL_BASED_OUTPATIENT_CLINIC_OR_DEPARTMENT_OTHER): Payer: Self-pay

## 2024-04-08 ENCOUNTER — Other Ambulatory Visit (HOSPITAL_BASED_OUTPATIENT_CLINIC_OR_DEPARTMENT_OTHER): Payer: Self-pay

## 2024-04-23 ENCOUNTER — Other Ambulatory Visit (HOSPITAL_COMMUNITY): Payer: Self-pay

## 2024-04-29 ENCOUNTER — Other Ambulatory Visit (HOSPITAL_BASED_OUTPATIENT_CLINIC_OR_DEPARTMENT_OTHER): Payer: Self-pay

## 2024-04-29 MED ORDER — FENTANYL 50 MCG/HR TD PT72
MEDICATED_PATCH | TRANSDERMAL | 0 refills | Status: AC
  Filled 2024-06-16: qty 10, 30d supply, fill #0

## 2024-05-11 ENCOUNTER — Other Ambulatory Visit (HOSPITAL_BASED_OUTPATIENT_CLINIC_OR_DEPARTMENT_OTHER): Payer: Self-pay

## 2024-05-15 ENCOUNTER — Other Ambulatory Visit (HOSPITAL_COMMUNITY): Payer: Self-pay

## 2024-06-16 ENCOUNTER — Other Ambulatory Visit (HOSPITAL_BASED_OUTPATIENT_CLINIC_OR_DEPARTMENT_OTHER): Payer: Self-pay

## 2024-07-02 ENCOUNTER — Other Ambulatory Visit (HOSPITAL_BASED_OUTPATIENT_CLINIC_OR_DEPARTMENT_OTHER): Payer: Self-pay

## 2024-07-02 MED ORDER — FENTANYL 50 MCG/HR TD PT72
1.0000 | MEDICATED_PATCH | TRANSDERMAL | 0 refills | Status: DC
  Filled 2024-08-18: qty 10, 30d supply, fill #0

## 2024-07-02 MED ORDER — FENTANYL 50 MCG/HR TD PT72
1.0000 | MEDICATED_PATCH | TRANSDERMAL | 0 refills | Status: AC
  Filled 2024-07-14: qty 10, 30d supply, fill #0

## 2024-07-14 ENCOUNTER — Other Ambulatory Visit (HOSPITAL_BASED_OUTPATIENT_CLINIC_OR_DEPARTMENT_OTHER): Payer: Self-pay

## 2024-07-15 ENCOUNTER — Other Ambulatory Visit (HOSPITAL_BASED_OUTPATIENT_CLINIC_OR_DEPARTMENT_OTHER): Payer: Self-pay

## 2024-08-18 ENCOUNTER — Other Ambulatory Visit (HOSPITAL_BASED_OUTPATIENT_CLINIC_OR_DEPARTMENT_OTHER): Payer: Self-pay

## 2024-08-18 ENCOUNTER — Other Ambulatory Visit: Payer: Self-pay

## 2024-09-17 ENCOUNTER — Other Ambulatory Visit: Payer: Self-pay

## 2024-09-17 ENCOUNTER — Other Ambulatory Visit (HOSPITAL_BASED_OUTPATIENT_CLINIC_OR_DEPARTMENT_OTHER): Payer: Self-pay

## 2024-09-17 MED ORDER — FENTANYL 50 MCG/HR TD PT72: 1.0000 | MEDICATED_PATCH | TRANSDERMAL | 0 refills | Status: AC

## 2024-09-17 MED ORDER — FENTANYL 50 MCG/HR TD PT72
MEDICATED_PATCH | TRANSDERMAL | 0 refills | Status: AC
  Filled 2024-09-17: qty 10, 30d supply, fill #0
